# Patient Record
Sex: Male | Born: 1940 | Race: White | Hispanic: No | Marital: Married | Smoking: Former smoker
Health system: Southern US, Community
[De-identification: ages and names within clinical notes are randomized; demographics above are authoritative.]

## PROBLEM LIST (undated history)

## (undated) DIAGNOSIS — R931 Abnormal findings on diagnostic imaging of heart and coronary circulation: Secondary | ICD-10-CM

## (undated) DIAGNOSIS — R0600 Dyspnea, unspecified: Secondary | ICD-10-CM

## (undated) DIAGNOSIS — I2510 Atherosclerotic heart disease of native coronary artery without angina pectoris: Secondary | ICD-10-CM

## (undated) DIAGNOSIS — E785 Hyperlipidemia, unspecified: Secondary | ICD-10-CM

## (undated) DIAGNOSIS — K759 Inflammatory liver disease, unspecified: Secondary | ICD-10-CM

## (undated) DIAGNOSIS — I10 Essential (primary) hypertension: Secondary | ICD-10-CM

## (undated) DIAGNOSIS — I209 Angina pectoris, unspecified: Secondary | ICD-10-CM

## (undated) DIAGNOSIS — L719 Rosacea, unspecified: Secondary | ICD-10-CM

## (undated) DIAGNOSIS — K219 Gastro-esophageal reflux disease without esophagitis: Secondary | ICD-10-CM

## (undated) DIAGNOSIS — M1990 Unspecified osteoarthritis, unspecified site: Secondary | ICD-10-CM

## (undated) DIAGNOSIS — I219 Acute myocardial infarction, unspecified: Secondary | ICD-10-CM

## (undated) HISTORY — DX: Hyperlipidemia, unspecified: E78.5

## (undated) HISTORY — DX: Atherosclerotic heart disease of native coronary artery without angina pectoris: I25.10

## (undated) HISTORY — PX: ROTATOR CUFF REPAIR: SHX139

## (undated) HISTORY — PX: BACK SURGERY: SHX140

## (undated) HISTORY — DX: Abnormal findings on diagnostic imaging of heart and coronary circulation: R93.1

## (undated) HISTORY — PX: CARDIAC CATHETERIZATION: SHX172

---

## 2015-06-24 DIAGNOSIS — R05 Cough: Secondary | ICD-10-CM | POA: Diagnosis not present

## 2015-08-27 DIAGNOSIS — R05 Cough: Secondary | ICD-10-CM | POA: Diagnosis not present

## 2016-02-04 DIAGNOSIS — Z23 Encounter for immunization: Secondary | ICD-10-CM | POA: Diagnosis not present

## 2016-02-23 DIAGNOSIS — H25019 Cortical age-related cataract, unspecified eye: Secondary | ICD-10-CM | POA: Diagnosis not present

## 2016-03-09 DIAGNOSIS — R5383 Other fatigue: Secondary | ICD-10-CM | POA: Diagnosis not present

## 2016-03-09 DIAGNOSIS — R918 Other nonspecific abnormal finding of lung field: Secondary | ICD-10-CM | POA: Diagnosis not present

## 2016-03-09 DIAGNOSIS — Z87891 Personal history of nicotine dependence: Secondary | ICD-10-CM | POA: Diagnosis not present

## 2016-03-09 DIAGNOSIS — M8580 Other specified disorders of bone density and structure, unspecified site: Secondary | ICD-10-CM | POA: Diagnosis not present

## 2016-03-09 DIAGNOSIS — R0602 Shortness of breath: Secondary | ICD-10-CM | POA: Diagnosis not present

## 2016-03-26 DIAGNOSIS — R0602 Shortness of breath: Secondary | ICD-10-CM | POA: Diagnosis not present

## 2016-03-26 DIAGNOSIS — R5383 Other fatigue: Secondary | ICD-10-CM | POA: Diagnosis not present

## 2016-04-06 DIAGNOSIS — K219 Gastro-esophageal reflux disease without esophagitis: Secondary | ICD-10-CM | POA: Diagnosis not present

## 2016-08-23 DIAGNOSIS — R490 Dysphonia: Secondary | ICD-10-CM | POA: Diagnosis not present

## 2016-08-23 DIAGNOSIS — R221 Localized swelling, mass and lump, neck: Secondary | ICD-10-CM | POA: Diagnosis not present

## 2016-08-24 DIAGNOSIS — J383 Other diseases of vocal cords: Secondary | ICD-10-CM | POA: Diagnosis not present

## 2016-08-24 DIAGNOSIS — R590 Localized enlarged lymph nodes: Secondary | ICD-10-CM | POA: Diagnosis not present

## 2016-08-24 DIAGNOSIS — R221 Localized swelling, mass and lump, neck: Secondary | ICD-10-CM | POA: Diagnosis not present

## 2016-08-24 DIAGNOSIS — R490 Dysphonia: Secondary | ICD-10-CM | POA: Diagnosis not present

## 2016-09-08 DIAGNOSIS — J383 Other diseases of vocal cords: Secondary | ICD-10-CM | POA: Diagnosis not present

## 2016-09-08 DIAGNOSIS — R490 Dysphonia: Secondary | ICD-10-CM | POA: Diagnosis not present

## 2016-09-08 DIAGNOSIS — J3800 Paralysis of vocal cords and larynx, unspecified: Secondary | ICD-10-CM | POA: Diagnosis not present

## 2016-09-13 DIAGNOSIS — J383 Other diseases of vocal cords: Secondary | ICD-10-CM | POA: Diagnosis not present

## 2016-09-13 DIAGNOSIS — R490 Dysphonia: Secondary | ICD-10-CM | POA: Diagnosis not present

## 2016-09-20 DIAGNOSIS — R490 Dysphonia: Secondary | ICD-10-CM | POA: Diagnosis not present

## 2016-09-20 DIAGNOSIS — J383 Other diseases of vocal cords: Secondary | ICD-10-CM | POA: Diagnosis not present

## 2016-09-27 DIAGNOSIS — R490 Dysphonia: Secondary | ICD-10-CM | POA: Diagnosis not present

## 2016-09-27 DIAGNOSIS — J383 Other diseases of vocal cords: Secondary | ICD-10-CM | POA: Diagnosis not present

## 2016-10-11 DIAGNOSIS — R490 Dysphonia: Secondary | ICD-10-CM | POA: Diagnosis not present

## 2016-10-11 DIAGNOSIS — J383 Other diseases of vocal cords: Secondary | ICD-10-CM | POA: Diagnosis not present

## 2016-11-10 DIAGNOSIS — M25551 Pain in right hip: Secondary | ICD-10-CM | POA: Diagnosis not present

## 2016-11-10 DIAGNOSIS — M1611 Unilateral primary osteoarthritis, right hip: Secondary | ICD-10-CM | POA: Diagnosis not present

## 2016-12-29 DIAGNOSIS — M169 Osteoarthritis of hip, unspecified: Secondary | ICD-10-CM | POA: Diagnosis not present

## 2017-01-03 DIAGNOSIS — Z23 Encounter for immunization: Secondary | ICD-10-CM | POA: Diagnosis not present

## 2017-01-26 DIAGNOSIS — Z136 Encounter for screening for cardiovascular disorders: Secondary | ICD-10-CM | POA: Diagnosis not present

## 2017-01-26 DIAGNOSIS — R0602 Shortness of breath: Secondary | ICD-10-CM | POA: Diagnosis not present

## 2017-02-18 ENCOUNTER — Encounter: Payer: Self-pay | Admitting: Cardiology

## 2017-02-18 DIAGNOSIS — R001 Bradycardia, unspecified: Secondary | ICD-10-CM | POA: Diagnosis not present

## 2017-02-18 DIAGNOSIS — E785 Hyperlipidemia, unspecified: Secondary | ICD-10-CM | POA: Diagnosis not present

## 2017-02-18 DIAGNOSIS — R0789 Other chest pain: Secondary | ICD-10-CM | POA: Diagnosis not present

## 2017-02-18 DIAGNOSIS — I493 Ventricular premature depolarization: Secondary | ICD-10-CM | POA: Diagnosis not present

## 2017-03-02 DIAGNOSIS — I491 Atrial premature depolarization: Secondary | ICD-10-CM | POA: Diagnosis not present

## 2017-03-02 DIAGNOSIS — I471 Supraventricular tachycardia: Secondary | ICD-10-CM | POA: Diagnosis not present

## 2017-03-02 DIAGNOSIS — E785 Hyperlipidemia, unspecified: Secondary | ICD-10-CM | POA: Diagnosis not present

## 2017-03-02 DIAGNOSIS — I472 Ventricular tachycardia: Secondary | ICD-10-CM | POA: Diagnosis not present

## 2017-03-02 DIAGNOSIS — R9431 Abnormal electrocardiogram [ECG] [EKG]: Secondary | ICD-10-CM | POA: Diagnosis not present

## 2017-03-02 DIAGNOSIS — R079 Chest pain, unspecified: Secondary | ICD-10-CM | POA: Diagnosis not present

## 2017-03-02 DIAGNOSIS — R9439 Abnormal result of other cardiovascular function study: Secondary | ICD-10-CM | POA: Diagnosis not present

## 2017-03-02 DIAGNOSIS — I493 Ventricular premature depolarization: Secondary | ICD-10-CM | POA: Diagnosis not present

## 2017-03-07 DIAGNOSIS — R079 Chest pain, unspecified: Secondary | ICD-10-CM | POA: Diagnosis not present

## 2017-03-09 DIAGNOSIS — I491 Atrial premature depolarization: Secondary | ICD-10-CM | POA: Diagnosis not present

## 2017-03-09 DIAGNOSIS — I471 Supraventricular tachycardia: Secondary | ICD-10-CM | POA: Diagnosis not present

## 2017-03-09 DIAGNOSIS — I472 Ventricular tachycardia: Secondary | ICD-10-CM | POA: Diagnosis not present

## 2017-03-09 DIAGNOSIS — I493 Ventricular premature depolarization: Secondary | ICD-10-CM | POA: Diagnosis not present

## 2017-03-11 DIAGNOSIS — R079 Chest pain, unspecified: Secondary | ICD-10-CM | POA: Diagnosis not present

## 2017-04-06 DIAGNOSIS — M25511 Pain in right shoulder: Secondary | ICD-10-CM | POA: Diagnosis not present

## 2017-04-06 DIAGNOSIS — E785 Hyperlipidemia, unspecified: Secondary | ICD-10-CM | POA: Diagnosis not present

## 2017-04-06 DIAGNOSIS — R9439 Abnormal result of other cardiovascular function study: Secondary | ICD-10-CM | POA: Diagnosis not present

## 2017-04-06 DIAGNOSIS — Z79899 Other long term (current) drug therapy: Secondary | ICD-10-CM | POA: Diagnosis not present

## 2017-04-06 DIAGNOSIS — R079 Chest pain, unspecified: Secondary | ICD-10-CM | POA: Diagnosis not present

## 2017-04-06 DIAGNOSIS — M25649 Stiffness of unspecified hand, not elsewhere classified: Secondary | ICD-10-CM | POA: Diagnosis not present

## 2017-05-13 ENCOUNTER — Ambulatory Visit (INDEPENDENT_AMBULATORY_CARE_PROVIDER_SITE_OTHER): Payer: Medicare Other | Admitting: Cardiology

## 2017-05-13 ENCOUNTER — Encounter: Payer: Self-pay | Admitting: Cardiology

## 2017-05-13 VITALS — BP 146/84 | HR 55 | Ht 70.0 in | Wt 191.2 lb

## 2017-05-13 DIAGNOSIS — R931 Abnormal findings on diagnostic imaging of heart and coronary circulation: Secondary | ICD-10-CM | POA: Diagnosis not present

## 2017-05-13 DIAGNOSIS — R9439 Abnormal result of other cardiovascular function study: Secondary | ICD-10-CM

## 2017-05-13 DIAGNOSIS — I10 Essential (primary) hypertension: Secondary | ICD-10-CM | POA: Diagnosis not present

## 2017-05-13 DIAGNOSIS — R5383 Other fatigue: Secondary | ICD-10-CM

## 2017-05-13 DIAGNOSIS — R0609 Other forms of dyspnea: Secondary | ICD-10-CM

## 2017-05-13 DIAGNOSIS — Z01812 Encounter for preprocedural laboratory examination: Secondary | ICD-10-CM

## 2017-05-13 DIAGNOSIS — Z136 Encounter for screening for cardiovascular disorders: Secondary | ICD-10-CM | POA: Diagnosis not present

## 2017-05-13 DIAGNOSIS — I209 Angina pectoris, unspecified: Secondary | ICD-10-CM

## 2017-05-13 DIAGNOSIS — E785 Hyperlipidemia, unspecified: Secondary | ICD-10-CM

## 2017-05-13 DIAGNOSIS — I2510 Atherosclerotic heart disease of native coronary artery without angina pectoris: Secondary | ICD-10-CM

## 2017-05-13 HISTORY — DX: Angina pectoris, unspecified: I20.9

## 2017-05-13 HISTORY — DX: Atherosclerotic heart disease of native coronary artery without angina pectoris: I25.10

## 2017-05-13 NOTE — H&P (View-Only) (Signed)
Cardiology Office Note    Date:  05/13/2017   ID:  Patrick Nguyen, DOB 08/01/1940, MRN [id number]  PCP:  Patrick Huddle, MD  Cardiologist:   No primary care provider on file. Referring:  Patrick Huddle, MD  Chief Complaint  Patient presents with  . Shortness of Breath      History of Present Illness: Patrick Nguyen is a 77 y.o. male who is referred by Patrick Huddle, MD for evaluation of CAD.  He just moved here from [place].  I was able to look at old records and to speak with his cardiologist in [place].    he had shortness of breath in 2016.    Stress echocardiogram in 2016 demonstrated a well-preserved ejection fraction.  There were no wall motion abnormalities.  It looks like there was another stress echo in December 2017 also demonstrated no wall motion abnormalities.  This year when he was again seen by cardiology had some palpitations and may be some vague chest discomfort.  They ordered a coronary calcium score.  This came back at 400 with 3 vessel distribution.  I was able to speak with his cardiologist and get the results of the stress test that they did prior to him moving from [place].  He was found to have a fixed defect with peri-infarct ischemia.  There was ST depression post exercise.  His ejection fraction was 61%.  He did wear a Holter and was found to have some nonsustained cardia atrial tachycardia, some PVCs and one 3 beat run of nonsustained V. tach.  Because of the abnormalities he was called as he was leaving and it was suggested that he have a cardiac catheterization.  He has since moved here.  I have reviewed extensive outpatient records for this visit.  The patient is limited by shoulder pain.  He does get short of breath walking a short distance on level ground and this started probably more in the late fall of last year although that seems to have plateaued.  He gets some rare sharp chest pain.  He is not describing any substernal chest pressure, neck or arm discomfort.  Is  not describing palpitations, presyncope or syncope.  He is not having any PND or orthopnea.  He is limited by shoulder problems.  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  His wife and he moved back here because she was from here originally.   Past Medical History:  Diagnosis Date  . Elevated coronary artery calcium score   . Hyperlipidemia      Current Outpatient Medications  Medication Sig Dispense Refill  . aspirin EC 81 MG tablet Take 81 mg by mouth daily.    . metoprolol tartrate (LOPRESSOR) 25 MG tablet Take 25 mg by mouth 2 (two) times daily.    . Rosuvastatin Calcium (CRESTOR PO) Take 20 mg by mouth every evening.      No current facility-administered medications for this visit.     Allergies:   Patient has no known allergies.    Social History:  The patient  reports that he quit smoking about 31 years ago. he has never used smokeless tobacco. He reports that he drinks about 1.8 oz of alcohol per week. He reports that he does not use drugs.   Family History:  The patient's family history includes AAA (abdominal aortic aneurysm) in his father; Cancer in his father; Heart disease (age of onset: 32) in his mother; Hypertension in his mother.  ROS:  Please see the history of present illness.   Otherwise, review of systems are positive for none.   All other systems are reviewed and negative.    PHYSICAL EXAM: VS:  BP (!) 146/84 (BP Location: Left Arm)   Pulse (!) 55   Ht 5\' 10"  (1.778 m)   Wt 191 lb 3.2 oz (86.7 kg)   BMI 27.43 kg/m  , BMI Body mass index is 27.43 kg/m. GENERAL:  Well appearing HEENT:  Pupils equal round and reactive, fundi not visualized, oral mucosa unremarkable NECK:  No jugular venous distention, waveform within normal limits, carotid upstroke brisk and symmetric, no bruits, no thyromegaly LYMPHATICS:  No cervical, inguinal adenopathy LUNGS:  Clear to auscultation bilaterally BACK:  No CVA tenderness CHEST:  Unremarkable HEART:  PMI not  displaced or sustained,S1 and S2 within normal limits, no S3, no S4, no clicks, no rubs, no murmurs ABD:  Flat, positive bowel sounds normal in frequency in pitch, no bruits, no rebound, no guarding, no midline pulsatile mass, no hepatomegaly, no splenomegaly EXT:  2 plus pulses throughout, no edema, no cyanosis no clubbing SKIN:  No rashes no nodules NEURO:  Cranial nerves II through XII grossly intact, motor grossly intact throughout PSYCH:  Cognitively intact, oriented to person place and time    EKG:  EKG is ordered today. The ekg ordered today  Demonstrated sinus rhythm, rate 60, axis within normal limits, intervals within normal limits, no acute ST-T wave changes.   Recent Labs: No results found for requested labs within last 8760 hours.    Lipid Panel No results found for: CHOL, TRIG, HDL, CHOLHDL, VLDL, LDLCALC, LDLDIRECT    Wt Readings from Last 3 Encounters:  05/13/17 191 lb 3.2 oz (86.7 kg)      Other studies Reviewed: Additional studies/ records that were reviewed today include: Outside office records.  I spoke with the cardiologist in [place].. Review of the above records demonstrates:  Please see elsewhere in the note.     ASSESSMENT AND PLAN:  SOB:  The patient has extensive coronary calcium, progressive shortness of breath.  And now an abnormal stress test.  The possibility of high-grade obstructive coronary disease is high.  Therefore, cardiac catheterization is indicated. The patient understands that risks included but are not limited to stroke (1 in 1000), death (1 in 35), kidney failure [usually temporary] (1 in 500), bleeding (1 in 200), allergic reaction [possibly serious] (1 in 200).  The patient understands and agrees to proceed.   ABNORMAL STRESS TEST:  As above.  CORONARY CALCIUM:  I will continue with aggressive risk reduction.  DYSLIPIDEMIA:   LDL was 67 and HDL 57.  He will continue the meds as listed.  AAA EVALTION:   The patient was a  previous smoker and his father had an abdominal aortic aneurysm that apparently ruptured.  He will have a screening ultrasound.  HTN:  The blood pressure is at target. No change in medications is indicated. We will continue with therapeutic lifestyle changes (TLC).'   Current medicines are reviewed at length with the patient today.  The patient does not have concerns regarding medicines.  The following changes have been made:  no change  Labs/ tests ordered today include: Cardiac cath.  No orders of the defined types were placed in this encounter.    Disposition:   FU with me after the cath.     Signed, Minus Breeding, MD  05/13/2017 12:33 PM    [facility]

## 2017-05-13 NOTE — Progress Notes (Signed)
Cardiology Office Note    Date:  05/13/2017   ID:  Patrick Nguyen, DOB Feb 04, 1941, MRN [id number]  PCP:  [name], MD  Cardiologist:   No primary care provider on file. Referring:  [name], MD  Chief Complaint  Patient presents with  . Shortness of Breath      History of Present Illness: Patrick Nguyen is a 77 y.o. male who is referred by [name], MD for evaluation of CAD.  He just moved here from [place].  I was able to look at old records and to speak with his cardiologist in [place].    he had shortness of breath in 2016.    Stress echocardiogram in 2016 demonstrated a well-preserved ejection fraction.  There were no wall motion abnormalities.  It looks like there was another stress echo in December 2017 also demonstrated no wall motion abnormalities.  This year when he was again seen by cardiology had some palpitations and may be some vague chest discomfort.  They ordered a coronary calcium score.  This came back at 400 with 3 vessel distribution.  I was able to speak with his cardiologist and get the results of the stress test that they did prior to him moving from [place].  He was found to have a fixed defect with peri-infarct ischemia.  There was ST depression post exercise.  His ejection fraction was 61%.  He did wear a Holter and was found to have some nonsustained cardia atrial tachycardia, some PVCs and one 3 beat run of nonsustained V. tach.  Because of the abnormalities he was called as he was leaving and it was suggested that he have a cardiac catheterization.  He has since moved here.  I have reviewed extensive outpatient records for this visit.  The patient is limited by shoulder pain.  He does get short of breath walking a short distance on level ground and this started probably more in the late fall of last year although that seems to have plateaued.  He gets some rare sharp chest pain.  He is not describing any substernal chest pressure, neck or arm discomfort.  Is  not describing palpitations, presyncope or syncope.  He is not having any PND or orthopnea.  He is limited by shoulder problems.  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  His wife and he moved back here because she was from here originally.   Past Medical History:  Diagnosis Date  . Elevated coronary artery calcium score   . Hyperlipidemia      Current Outpatient Medications  Medication Sig Dispense Refill  . aspirin EC 81 MG tablet Take 81 mg by mouth daily.    . metoprolol tartrate (LOPRESSOR) 25 MG tablet Take 25 mg by mouth 2 (two) times daily.    . Rosuvastatin Calcium (CRESTOR PO) Take 20 mg by mouth every evening.      No current facility-administered medications for this visit.     Allergies:   Patient has no known allergies.    Social History:  The patient  reports that he quit smoking about 31 years ago. he has never used smokeless tobacco. He reports that he drinks about 1.8 oz of alcohol per week. He reports that he does not use drugs.   Family History:  The patient's family history includes AAA (abdominal aortic aneurysm) in his father; Cancer in his father; Heart disease (age of onset: 42) in his mother; Hypertension in his mother.  ROS:  Please see the history of present illness.   Otherwise, review of systems are positive for none.   All other systems are reviewed and negative.    PHYSICAL EXAM: VS:  BP (!) 146/84 (BP Location: Left Arm)   Pulse (!) 55   Ht 5\' 10"  (1.778 m)   Wt 191 lb 3.2 oz (86.7 kg)   BMI 27.43 kg/m  , BMI Body mass index is 27.43 kg/m. GENERAL:  Well appearing HEENT:  Pupils equal round and reactive, fundi not visualized, oral mucosa unremarkable NECK:  No jugular venous distention, waveform within normal limits, carotid upstroke brisk and symmetric, no bruits, no thyromegaly LYMPHATICS:  No cervical, inguinal adenopathy LUNGS:  Clear to auscultation bilaterally BACK:  No CVA tenderness CHEST:  Unremarkable HEART:  PMI not  displaced or sustained,S1 and S2 within normal limits, no S3, no S4, no clicks, no rubs, no murmurs ABD:  Flat, positive bowel sounds normal in frequency in pitch, no bruits, no rebound, no guarding, no midline pulsatile mass, no hepatomegaly, no splenomegaly EXT:  2 plus pulses throughout, no edema, no cyanosis no clubbing SKIN:  No rashes no nodules NEURO:  Cranial nerves II through XII grossly intact, motor grossly intact throughout PSYCH:  Cognitively intact, oriented to person place and time    EKG:  EKG is ordered today. The ekg ordered today  Demonstrated sinus rhythm, rate 60, axis within normal limits, intervals within normal limits, no acute ST-T wave changes.   Recent Labs: No results found for requested labs within last 8760 hours.    Lipid Panel No results found for: CHOL, TRIG, HDL, CHOLHDL, VLDL, LDLCALC, LDLDIRECT    Wt Readings from Last 3 Encounters:  05/13/17 191 lb 3.2 oz (86.7 kg)      Other studies Reviewed: Additional studies/ records that were reviewed today include: Outside office records.  I spoke with the cardiologist in [place].. Review of the above records demonstrates:  Please see elsewhere in the note.     ASSESSMENT AND PLAN:  SOB:  The patient has extensive coronary calcium, progressive shortness of breath.  And now an abnormal stress test.  The possibility of high-grade obstructive coronary disease is high.  Therefore, cardiac catheterization is indicated. The patient understands that risks included but are not limited to stroke (1 in 1000), death (1 in 66), kidney failure [usually temporary] (1 in 500), bleeding (1 in 200), allergic reaction [possibly serious] (1 in 200).  The patient understands and agrees to proceed.   ABNORMAL STRESS TEST:  As above.  CORONARY CALCIUM:  I will continue with aggressive risk reduction.  DYSLIPIDEMIA:   LDL was 67 and HDL 57.  He will continue the meds as listed.  AAA EVALTION:   The patient was a  previous smoker and his father had an abdominal aortic aneurysm that apparently ruptured.  He will have a screening ultrasound.  HTN:  The blood pressure is at target. No change in medications is indicated. We will continue with therapeutic lifestyle changes (TLC).'   Current medicines are reviewed at length with the patient today.  The patient does not have concerns regarding medicines.  The following changes have been made:  no change  Labs/ tests ordered today include: Cardiac cath.  No orders of the defined types were placed in this encounter.    Disposition:   FU with me after the cath.     Signed, Minus Breeding, MD  05/13/2017 12:33 PM    [facility]

## 2017-05-13 NOTE — Addendum Note (Signed)
Addended by: Vennie Homans on: 05/13/2017 02:59 PM   Modules accepted: Orders

## 2017-05-13 NOTE — Patient Instructions (Signed)
Medication Instructions:  Continue current medications  If you need a refill on your cardiac medications before your next appointment, please call your pharmacy.  Labwork: Pre Op Labs HERE IN OUR OFFICE AT [facility]  Take the provided lab slips for you to take with you to the lab for you blood draw.   You will NOT need to fast   You may go to any [facility] lab that is convenient for you however, we do have a lab in our office that is able to assist you. You do NOT need an appointment for our lab. Once in our office lobby there is a podium to the right of the check-in desk where you are to sign-in and ring a doorbell to alert [place] you are here. Lab is open Monday-Friday from 8:00am to 4:00pm; and is closed for lunch from 12:45p-1:45pm    Testing/Procedures: Your physician has requested that you have a cardiac catheterization. Cardiac catheterization is used to diagnose and/or treat various heart conditions. Doctors may recommend this procedure for a number of different reasons. The most common reason is to evaluate chest pain. Chest pain can be a symptom of coronary artery disease (CAD), and cardiac catheterization can show whether plaque is narrowing or blocking your heart's arteries. This procedure is also used to evaluate the valves, as well as measure the blood flow and oxygen levels in different parts of your heart. For further information please visit [email or web address]. Please follow instruction sheet, as given.  Your physician has requested that you have an abdominal aorta duplex. During this test, an ultrasound is used to evaluate the aorta. Allow 30 minutes for this exam. Do not eat after midnight the day before and avoid carbonated beverages   Special Instructions:   [facility] [address] [place] [address] Dept: [phone] Loc: [phone]  Patrick Nguyen  05/13/2017  You are scheduled  for a Cardiac Catheterization on Thursday, February 7 with Dr. [name].  1. Please arrive at the [facility] (Main Entrance A) at [facility]: [place], [place] [address] at 10:00 AM (two hours before your procedure to ensure your preparation). Free valet parking service is available.   Special note: Every effort is made to have your procedure done on time. Please understand that emergencies sometimes delay scheduled procedures.  2. Diet: Do not eat or drink anything after midnight prior to your procedure except sips of water to take medications.  3. Labs: Monday  4. Medication instructions in preparation for your procedure:    Current Outpatient Medications (Cardiovascular):  .  metoprolol tartrate (LOPRESSOR) 25 MG tablet, Take 25 mg by mouth 2 (two) times daily. .  Rosuvastatin Calcium (CRESTOR PO), Take 20 mg by mouth every evening.    Current Outpatient Medications (Analgesics):  .  aspirin EC 81 MG tablet, Take 81 mg by mouth daily.   *For reference purposes while preparing patient instructions.   Delete this med list prior to printing instructions for patient.*   On the morning of your procedure, morning medicines  You may use sips of water.  5. Plan for one night stay--bring personal belongings. 6. Bring a current list of your medications and current insurance cards. 7. You MUST have a responsible person to drive you home. 8. Someone MUST be with you the first 24 hours after you arrive home or your discharge will be delayed. 9. Please wear clothes that are easy to get  on and off and wear slip-on shoes.  Thank you for allowing [place] to care for you!   -- [facility]   Follow-Up: Your physician wants you to follow-up in: After Cath.     Thank you for choosing [facility]!!

## 2017-05-16 DIAGNOSIS — Z01812 Encounter for preprocedural laboratory examination: Secondary | ICD-10-CM | POA: Diagnosis not present

## 2017-05-16 DIAGNOSIS — R9439 Abnormal result of other cardiovascular function study: Secondary | ICD-10-CM | POA: Diagnosis not present

## 2017-05-16 DIAGNOSIS — R5383 Other fatigue: Secondary | ICD-10-CM | POA: Diagnosis not present

## 2017-05-17 ENCOUNTER — Telehealth: Payer: Self-pay | Admitting: *Deleted

## 2017-05-17 ENCOUNTER — Other Ambulatory Visit: Payer: Self-pay | Admitting: Cardiology

## 2017-05-17 ENCOUNTER — Other Ambulatory Visit: Payer: Self-pay | Admitting: *Deleted

## 2017-05-17 DIAGNOSIS — Z8249 Family history of ischemic heart disease and other diseases of the circulatory system: Secondary | ICD-10-CM

## 2017-05-17 DIAGNOSIS — Z136 Encounter for screening for cardiovascular disorders: Secondary | ICD-10-CM

## 2017-05-17 DIAGNOSIS — Z87891 Personal history of nicotine dependence: Secondary | ICD-10-CM

## 2017-05-17 DIAGNOSIS — Z01818 Encounter for other preprocedural examination: Secondary | ICD-10-CM

## 2017-05-17 LAB — BASIC METABOLIC PANEL
BUN/Creatinine Ratio: 14 (ref 10–24)
BUN: 14 mg/dL (ref 8–27)
CO2: 22 mmol/L (ref 20–29)
CREATININE: 0.98 mg/dL (ref 0.76–1.27)
Calcium: 9 mg/dL (ref 8.6–10.2)
Chloride: 104 mmol/L (ref 96–106)
GFR calc Af Amer: 87 mL/min/{1.73_m2} (ref >59)
GFR calc non Af Amer: 76 mL/min/{1.73_m2} (ref >59)
GLUCOSE: 87 mg/dL (ref 65–99)
Potassium: 4.6 mmol/L (ref 3.5–5.2)
SODIUM: 141 mmol/L (ref 134–144)

## 2017-05-17 LAB — CBC
HEMOGLOBIN: 15.4 g/dL (ref 13.0–17.7)
Hematocrit: 45.3 % (ref 37.5–51.0)
MCH: 30.6 pg (ref 26.6–33.0)
MCHC: 34 g/dL (ref 31.5–35.7)
MCV: 90 fL (ref 79–97)
Platelets: 231 10*3/uL (ref 150–379)
RBC: 5.03 x10E6/uL (ref 4.14–5.80)
RDW: 13.4 % (ref 12.3–15.4)
WBC: 6.9 10*3/uL (ref 3.4–10.8)

## 2017-05-17 LAB — TSH: TSH: 2.26 u[IU]/mL (ref 0.450–4.500)

## 2017-05-17 NOTE — Telephone Encounter (Signed)
Cardiac Cath scheduled for: Thursday May 19, 2017 [facility] Main Entrance A[facility] at: 10 AM Allergies: verified allergies listed in Epic Diabetes: no  HOLD medications: None    AM meds can be  taken pre-cath with sip of water including: ASA 81 mg am of procedure.   Nothing to eat or drink after midnight. Confirmed patient has responsible person to drive home post procedure and observe patient for 24 hours: yes

## 2017-05-17 NOTE — Telephone Encounter (Signed)
I spoke with patient, confirmed and discussed cath instructions with patient, he verbalize understanding, thanked me for call

## 2017-05-18 ENCOUNTER — Ambulatory Visit (HOSPITAL_COMMUNITY)
Admission: RE | Admit: 2017-05-18 | Discharge: 2017-05-18 | Disposition: A | Discharge status: Home / Self Care | Payer: Medicare Other | Source: Ambulatory Visit | Attending: Cardiovascular Disease | Admitting: Cardiovascular Disease

## 2017-05-18 DIAGNOSIS — Z8249 Family history of ischemic heart disease and other diseases of the circulatory system: Secondary | ICD-10-CM | POA: Diagnosis not present

## 2017-05-18 DIAGNOSIS — Z87891 Personal history of nicotine dependence: Secondary | ICD-10-CM | POA: Diagnosis not present

## 2017-05-18 DIAGNOSIS — I723 Aneurysm of iliac artery: Secondary | ICD-10-CM | POA: Insufficient documentation

## 2017-05-18 DIAGNOSIS — Z136 Encounter for screening for cardiovascular disorders: Secondary | ICD-10-CM | POA: Insufficient documentation

## 2017-05-19 ENCOUNTER — Ambulatory Visit (HOSPITAL_COMMUNITY)
Admission: RE | Admit: 2017-05-19 | Discharge: 2017-05-19 | Disposition: A | Discharge status: Home / Self Care | Payer: Medicare Other | Source: Ambulatory Visit | Attending: Interventional Cardiology | Admitting: Interventional Cardiology

## 2017-05-19 ENCOUNTER — Encounter (HOSPITAL_COMMUNITY)
Admission: RE | Disposition: A | Discharge status: Home / Self Care | Payer: Self-pay | Source: Ambulatory Visit | Attending: Interventional Cardiology

## 2017-05-19 DIAGNOSIS — R0609 Other forms of dyspnea: Secondary | ICD-10-CM

## 2017-05-19 DIAGNOSIS — I10 Essential (primary) hypertension: Secondary | ICD-10-CM | POA: Insufficient documentation

## 2017-05-19 DIAGNOSIS — I2510 Atherosclerotic heart disease of native coronary artery without angina pectoris: Secondary | ICD-10-CM | POA: Diagnosis not present

## 2017-05-19 DIAGNOSIS — Z87891 Personal history of nicotine dependence: Secondary | ICD-10-CM | POA: Diagnosis not present

## 2017-05-19 DIAGNOSIS — Z7982 Long term (current) use of aspirin: Secondary | ICD-10-CM | POA: Diagnosis not present

## 2017-05-19 DIAGNOSIS — I2582 Chronic total occlusion of coronary artery: Secondary | ICD-10-CM | POA: Insufficient documentation

## 2017-05-19 DIAGNOSIS — Z8249 Family history of ischemic heart disease and other diseases of the circulatory system: Secondary | ICD-10-CM | POA: Insufficient documentation

## 2017-05-19 DIAGNOSIS — R9439 Abnormal result of other cardiovascular function study: Secondary | ICD-10-CM | POA: Diagnosis not present

## 2017-05-19 DIAGNOSIS — R931 Abnormal findings on diagnostic imaging of heart and coronary circulation: Secondary | ICD-10-CM | POA: Diagnosis present

## 2017-05-19 DIAGNOSIS — I25118 Atherosclerotic heart disease of native coronary artery with other forms of angina pectoris: Secondary | ICD-10-CM | POA: Insufficient documentation

## 2017-05-19 DIAGNOSIS — E785 Hyperlipidemia, unspecified: Secondary | ICD-10-CM | POA: Diagnosis not present

## 2017-05-19 DIAGNOSIS — Z01818 Encounter for other preprocedural examination: Secondary | ICD-10-CM

## 2017-05-19 DIAGNOSIS — Z79899 Other long term (current) drug therapy: Secondary | ICD-10-CM | POA: Insufficient documentation

## 2017-05-19 HISTORY — PX: INTRAVASCULAR PRESSURE WIRE/FFR STUDY: CATH118243

## 2017-05-19 HISTORY — PX: LEFT HEART CATH AND CORONARY ANGIOGRAPHY: CATH118249

## 2017-05-19 LAB — PROTIME-INR
INR: 1
Prothrombin Time: 13.1 seconds (ref 11.4–15.2)

## 2017-05-19 LAB — POCT ACTIVATED CLOTTING TIME: Activated Clotting Time: 301 seconds

## 2017-05-19 SURGERY — LEFT HEART CATH AND CORONARY ANGIOGRAPHY
Anesthesia: LOCAL

## 2017-05-19 MED ORDER — VERAPAMIL HCL 2.5 MG/ML IV SOLN
INTRAVENOUS | Status: DC | PRN
  Administered 2017-05-19: 13:00:00 via INTRA_ARTERIAL

## 2017-05-19 MED ORDER — HEPARIN (PORCINE) IN NACL 2-0.9 UNIT/ML-% IJ SOLN
INTRAMUSCULAR | Status: AC | PRN
  Administered 2017-05-19: 1000 mL

## 2017-05-19 MED ORDER — ONDANSETRON HCL 4 MG/2ML IJ SOLN: 4.0000 mg | Freq: Four times a day (QID) | INTRAMUSCULAR | Status: DC | PRN

## 2017-05-19 MED ORDER — SODIUM CHLORIDE 0.9 % IV SOLN
INTRAVENOUS | Status: AC
  Administered 2017-05-19: 15:00:00 via INTRAVENOUS

## 2017-05-19 MED ORDER — SODIUM CHLORIDE 0.9% FLUSH: 3.0000 mL | INTRAVENOUS | Status: DC | PRN

## 2017-05-19 MED ORDER — MIDAZOLAM HCL 2 MG/2ML IJ SOLN
INTRAMUSCULAR | Status: DC | PRN
  Administered 2017-05-19 (×2): 1 mg via INTRAVENOUS

## 2017-05-19 MED ORDER — ADENOSINE 12 MG/4ML IV SOLN
INTRAVENOUS | Status: AC
  Filled 2017-05-19: qty 16

## 2017-05-19 MED ORDER — NITROGLYCERIN 1 MG/10 ML FOR IR/CATH LAB
INTRA_ARTERIAL | Status: DC | PRN
  Administered 2017-05-19: 200 ug via INTRACORONARY

## 2017-05-19 MED ORDER — VERAPAMIL HCL 2.5 MG/ML IV SOLN
INTRAVENOUS | Status: AC
  Filled 2017-05-19: qty 2

## 2017-05-19 MED ORDER — NITROGLYCERIN 1 MG/10 ML FOR IR/CATH LAB
INTRA_ARTERIAL | Status: AC
  Filled 2017-05-19: qty 10

## 2017-05-19 MED ORDER — FENTANYL CITRATE (PF) 100 MCG/2ML IJ SOLN
INTRAMUSCULAR | Status: AC
  Filled 2017-05-19: qty 2

## 2017-05-19 MED ORDER — MIDAZOLAM HCL 2 MG/2ML IJ SOLN
INTRAMUSCULAR | Status: AC
  Filled 2017-05-19: qty 2

## 2017-05-19 MED ORDER — HEPARIN SODIUM (PORCINE) 1000 UNIT/ML IJ SOLN
INTRAMUSCULAR | Status: DC | PRN
  Administered 2017-05-19: 5000 [IU] via INTRAVENOUS
  Administered 2017-05-19: 4000 [IU] via INTRAVENOUS

## 2017-05-19 MED ORDER — HEPARIN SODIUM (PORCINE) 1000 UNIT/ML IJ SOLN
INTRAMUSCULAR | Status: AC
  Filled 2017-05-19: qty 1

## 2017-05-19 MED ORDER — SODIUM CHLORIDE 0.9% FLUSH: 3.0000 mL | Freq: Two times a day (BID) | INTRAVENOUS | Status: DC

## 2017-05-19 MED ORDER — SODIUM CHLORIDE 0.9 % IV SOLN
INTRAVENOUS | Status: DC
  Administered 2017-05-19: 11:00:00 via INTRAVENOUS

## 2017-05-19 MED ORDER — ASPIRIN 81 MG PO CHEW: 81.0000 mg | CHEWABLE_TABLET | Freq: Every day | ORAL | Status: DC

## 2017-05-19 MED ORDER — FENTANYL CITRATE (PF) 100 MCG/2ML IJ SOLN
INTRAMUSCULAR | Status: DC | PRN
  Administered 2017-05-19: 50 ug via INTRAVENOUS

## 2017-05-19 MED ORDER — ASPIRIN 81 MG PO CHEW: 81.0000 mg | CHEWABLE_TABLET | ORAL | Status: DC

## 2017-05-19 MED ORDER — LIDOCAINE HCL (PF) 1 % IJ SOLN
INTRAMUSCULAR | Status: DC | PRN
  Administered 2017-05-19: 5 mL

## 2017-05-19 MED ORDER — LIDOCAINE HCL 1 % IJ SOLN
INTRAMUSCULAR | Status: AC
  Filled 2017-05-19: qty 20

## 2017-05-19 MED ORDER — HEPARIN (PORCINE) IN NACL 2-0.9 UNIT/ML-% IJ SOLN
INTRAMUSCULAR | Status: AC
  Filled 2017-05-19: qty 1000

## 2017-05-19 MED ORDER — IOHEXOL 350 MG/ML SOLN
INTRAVENOUS | Status: DC | PRN
  Administered 2017-05-19: 160 mL via INTRAVENOUS

## 2017-05-19 MED ORDER — ADENOSINE (DIAGNOSTIC) 140MCG/KG/MIN
INTRAVENOUS | Status: DC | PRN
  Administered 2017-05-19: 140 ug/kg/min via INTRAVENOUS

## 2017-05-19 MED ORDER — SODIUM CHLORIDE 0.9 % IV SOLN: 250.0000 mL | INTRAVENOUS | Status: DC | PRN

## 2017-05-19 MED ORDER — ACETAMINOPHEN 325 MG PO TABS: 650.0000 mg | ORAL_TABLET | ORAL | Status: DC | PRN

## 2017-05-19 MED ORDER — OXYCODONE HCL 5 MG PO TABS: 5.0000 mg | ORAL_TABLET | ORAL | Status: DC | PRN

## 2017-05-19 SURGICAL SUPPLY — 16 items
CATH 5FR JL3.5 JR4 ANG PIG MP (CATHETERS) ×2 IMPLANT
CATH LAUNCHER 5F EBU3.5 (CATHETERS) ×2 IMPLANT
CATH MICROCATH NAVVUS (MICROCATHETER) ×1 IMPLANT
COVER PRB 48X5XTLSCP FOLD TPE (BAG) ×1 IMPLANT
COVER PROBE 5X48 (BAG) ×1
DEVICE RAD COMP TR BAND LRG (VASCULAR PRODUCTS) IMPLANT
GLIDESHEATH SLEND A-KIT 6F 22G (SHEATH) ×2 IMPLANT
GUIDEWIRE INQWIRE 1.5J.035X260 (WIRE) ×1 IMPLANT
INQWIRE 1.5J .035X260CM (WIRE) ×2
KIT ESSENTIALS PG (KITS) ×2 IMPLANT
KIT HEART LEFT (KITS) ×2 IMPLANT
MICROCATHETER NAVVUS (MICROCATHETER) ×2
PACK CARDIAC CATHETERIZATION (CUSTOM PROCEDURE TRAY) ×2 IMPLANT
TRANSDUCER W/STOPCOCK (MISCELLANEOUS) ×2 IMPLANT
TUBING CIL FLEX 10 FLL-RA (TUBING) ×2 IMPLANT
WIRE ASAHI PROWATER 180CM (WIRE) ×2 IMPLANT

## 2017-05-19 NOTE — Interval H&P Note (Signed)
Cath Lab Visit (complete for each Cath Lab visit)  Clinical Evaluation Leading to the Procedure:   ACS: No.  Non-ACS:    Anginal Classification: CCS III  Anti-ischemic medical therapy: Minimal Therapy (1 class of medications)  Non-Invasive Test Results: Intermediate-risk stress test findings: cardiac mortality 1-3%/year  Prior CABG: No previous CABG      History and Physical Interval Note:  05/19/2017 12:02 PM  [name]  has presented today for surgery, with the diagnosis of abn stress  The various methods of treatment have been discussed with the patient and family. After consideration of risks, benefits and other options for treatment, the patient has consented to  Procedure(s): LEFT HEART CATH AND CORONARY ANGIOGRAPHY (N/A) as a surgical intervention .  The patient's history has been reviewed, patient examined, no change in status, stable for surgery.  I have reviewed the patient's chart and labs.  Questions were answered to the patient's satisfaction.     [name] III

## 2017-05-19 NOTE — Discharge Instructions (Signed)
**Note Patrick Nguyen-identified via Obfuscation** Radial Site Care °Refer to this sheet in the next few weeks. These instructions provide you with information about caring for yourself after your procedure. Your health care provider may also give you more specific instructions. Your treatment has been planned according to current medical practices, but problems sometimes occur. Call your health care provider if you have any problems or questions after your procedure. °What can I expect after the procedure? °After your procedure, it is typical to have the following: °· Bruising at the radial site that usually fades within 1-2 weeks. °· Blood collecting in the tissue (hematoma) that may be painful to the touch. It should usually decrease in size and tenderness within 1-2 weeks. ° °Follow these instructions at home: °· Take medicines only as directed by your health care provider. °· You may shower 24-48 hours after the procedure or as directed by your health care provider. Remove the bandage (dressing) and gently wash the site with plain soap and water. Pat the area dry with a clean towel. Do not rub the site, because this may cause bleeding. °· Do not take baths, swim, or use a hot tub until your health care provider approves. °· Check your insertion site every day for redness, swelling, or drainage. °· Do not apply powder or lotion to the site. °· Do not flex or bend the affected arm for 24 hours or as directed by your health care provider. °· Do not push or pull heavy objects with the affected arm for 24 hours or as directed by your health care provider. °· Do not lift over 10 lb (4.5 kg) for 5 days after your procedure or as directed by your health care provider. °· Ask your health care provider when it is okay to: °? Return to work or school. °? Resume usual physical activities or sports. °? Resume sexual activity. °· Do not drive home if you are discharged the same day as the procedure. Have someone else drive you. °· You may drive 24 hours after the procedure  unless otherwise instructed by your health care provider. °· Do not operate machinery or power tools for 24 hours after the procedure. °· If your procedure was done as an outpatient procedure, which means that you went home the same day as your procedure, a responsible adult should be with you for the first 24 hours after you arrive home. °· Keep all follow-up visits as directed by your health care provider. This is important. °Contact a health care provider if: °· You have a fever. °· You have chills. °· You have increased bleeding from the radial site. Hold pressure on the site. °Get help right away if: °· You have unusual pain at the radial site. °· You have redness, warmth, or swelling at the radial site. °· You have drainage (other than a small amount of blood on the dressing) from the radial site. °· The radial site is bleeding, and the bleeding does not stop after 30 minutes of holding steady pressure on the site. °· Your arm or hand becomes pale, cool, tingly, or numb. °This information is not intended to replace advice given to you by your health care provider. Make sure you discuss any questions you have with your health care provider. °Document Released: 05/01/2010 Document Revised: 09/04/2015 Document Reviewed: 10/15/2013 °Elsevier Interactive Patient Education © 2018 [employer] ° °

## 2017-05-19 NOTE — Research (Signed)
OPTIMIZE Informed Consent   Subject Name: Tanveer Proehl  Subject met inclusion and exclusion criteria.  The informed consent form, study requirements and expectations were reviewed with the subject and questions and concerns were addressed prior to the signing of the consent form.  The subject verbalized understanding of the trail requirements.  The subject agreed to participate in the OPTIMIZE trial and signed the informed consent.  The informed consent was obtained prior to performance of any protocol-specific procedures for the subject.  A copy of the signed informed consent was given to the subject and a copy was placed in the subject's medical record. 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  Hedrick,Tammy W 05/19/2017, 1200  

## 2017-05-20 ENCOUNTER — Encounter (HOSPITAL_COMMUNITY): Payer: Self-pay | Admitting: Interventional Cardiology

## 2017-05-20 MED FILL — Heparin Sodium (Porcine) 2 Unit/ML in Sodium Chloride 0.9%: INTRAMUSCULAR | Qty: 1000 | Status: AC

## 2017-05-20 MED FILL — Lidocaine HCl Local Inj 1%: INTRAMUSCULAR | Qty: 20 | Status: AC

## 2017-05-25 ENCOUNTER — Telehealth: Payer: Self-pay | Admitting: Cardiology

## 2017-05-25 NOTE — Telephone Encounter (Signed)
Other than an appointment on 2/25 with Dr. Percival Spanish, did not see a reason for a call. He has been made aware of the appointment and verbalized his understanding.

## 2017-05-25 NOTE — Telephone Encounter (Signed)
New Message   Patient is returning call from a voicemail that he received on 05/24/2017. He states no details was left other than to call Dr. Percival Spanish office.

## 2017-05-26 ENCOUNTER — Encounter: Payer: Medicare Other | Admitting: Cardiothoracic Surgery

## 2017-05-31 ENCOUNTER — Encounter: Payer: Self-pay | Admitting: Cardiothoracic Surgery

## 2017-05-31 ENCOUNTER — Institutional Professional Consult (permissible substitution) (INDEPENDENT_AMBULATORY_CARE_PROVIDER_SITE_OTHER): Payer: Medicare Other | Admitting: Cardiothoracic Surgery

## 2017-05-31 VITALS — BP 160/69 | HR 48 | Resp 20 | Ht 70.0 in | Wt 190.0 lb

## 2017-05-31 DIAGNOSIS — I2510 Atherosclerotic heart disease of native coronary artery without angina pectoris: Secondary | ICD-10-CM

## 2017-05-31 NOTE — Progress Notes (Signed)
PCP is [name], MD Referring Provider is [name], MD  Chief Complaint  Patient presents with  . 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 Prior to his move he had developed symptoms of angina and arrhythmias. He was recommended to see a cardiologist at the time of his arrival. The move was difficult for him with increased symptoms of chest pain shortness of breath. He recently had a terrible night with shortness of breath and chest tightness which scared his wife.  The patient was seen by Dr. [name] and prepared for cardiac catheterization by Dr. [name]. This demonstrated severe three-vessel coronary disease with chronic occlusion of the RCA which filled via collaterals and a hemodynamically significant moderate 70% stenosis of the LAD. The cervix marginal had an 85-90% stenosis. LV EF was normal. LVEDP was 15. CABG was recommended.  Echocardiogram shows no significant valvular disease.  Past Medical History:  Diagnosis Date  . Elevated coronary artery calcium score   . Hyperlipidemia     Past Surgical History:  Procedure Laterality Date  . INTRAVASCULAR PRESSURE WIRE/FFR STUDY N/A 05/19/2017   Procedure: INTRAVASCULAR PRESSURE WIRE/FFR STUDY;  Surgeon: [name], MD;  Location: [place];  Service: Cardiovascular;  Laterality: N/A;  . LEFT HEART CATH AND CORONARY ANGIOGRAPHY N/A 05/19/2017   Procedure: LEFT HEART CATH AND CORONARY ANGIOGRAPHY;  Surgeon: [name], MD;  Location: [facility];  Service: Cardiovascular;  Laterality: N/A;  . ROTATOR CUFF REPAIR Left     Family History  Problem Relation Age of Onset  . Heart disease Mother 51  . Hypertension Mother   . Cancer  Father        Larynx  . AAA (abdominal aortic aneurysm) Father     Social History Social History   Tobacco Use  . Smoking status: Former Smoker    Last attempt to quit: 05/13/1986    Years since quitting: 31.0  . Smokeless tobacco: Never Used  Substance Use Topics  . Alcohol use: Yes    Alcohol/week: 1.8 oz    Types: 2 Cans of beer, 1 Glasses of wine per week    Comment: daily  . Drug use: No    Current Outpatient Medications  Medication Sig Dispense Refill  . aspirin EC 81 MG tablet Take 81 mg by mouth daily.    . metoprolol tartrate (LOPRESSOR) 25 MG tablet Take 25 mg by mouth 2 (two) times daily.    . rosuvastatin (CRESTOR) 20 MG tablet Take 20 mg by mouth every evening.      No current facility-administered medications for this visit.     Allergies  Allergen Reactions  . Atorvastatin Other (See Comments)    Made pt not like himself    Review of Systems   The patient has had previous left shoulder rotator cuff repair. He recently fell on ice in [place] injuring his right upper arm and shoulder. The patient denies diabetes The patient has a remote history of smoking. The patient drinks 2-3 beers per evening No prior thoracic trauma or surgery No problems with general anesthesia No bleeding diathesis No difficulty swallowing or dental complaints No recent cough or symptoms of upper respiratory infection Previous colonoscopy was clear No symptoms of BPH or  UTI  BP (!) 160/69   Pulse (!) 48   Resp 20   Ht 5\' 10"  (1.778 m)   Wt 190 lb (86.2 kg)   SpO2 96% Comment: RA  BMI 27.26 kg/m  Physical Exam      Exam    General- alert and comfortable    Neck- no JVD, no cervical adenopathy palpable, no carotid bruit   Lungs- clear without rales, wheezes   Cor- regular rate and rhythm, no murmur , gallop   Abdomen- soft, non-tender   Extremities - warm, non-tender, minimal edema   Neuro- oriented, appropriate, no focal weakness   Diagnostic  Tests: Three-vessel CAD preserved LV function by cath and echocardiogram  Impression: I agree with recommendation for multivessel CABG with bypass grafts to LAD, diagonal, OM and posterior descending. Patient recently had nocturnal symptoms which are very concerning so we will proceed with surgery on the first available opening in the schedule, February 26 at [facility] hospital.  Plan: Patient returns for his preop evaluation later this week including Doppler studies and PFTs and return for multivessel CABG on favored 26. I discussed the procedure of CABG in detail with the patient is wife including the benefits and expected recovery and potential risks. He agrees to proceed with surgery.   [name], MD [facility] [phone]

## 2017-06-01 ENCOUNTER — Telehealth: Payer: Self-pay | Admitting: Cardiology

## 2017-06-01 ENCOUNTER — Other Ambulatory Visit: Payer: Self-pay | Admitting: *Deleted

## 2017-06-01 DIAGNOSIS — M25649 Stiffness of unspecified hand, not elsewhere classified: Secondary | ICD-10-CM | POA: Diagnosis not present

## 2017-06-01 DIAGNOSIS — M25511 Pain in right shoulder: Secondary | ICD-10-CM | POA: Diagnosis not present

## 2017-06-01 DIAGNOSIS — R079 Chest pain, unspecified: Secondary | ICD-10-CM | POA: Diagnosis not present

## 2017-06-01 DIAGNOSIS — E785 Hyperlipidemia, unspecified: Secondary | ICD-10-CM | POA: Diagnosis not present

## 2017-06-01 DIAGNOSIS — Z79899 Other long term (current) drug therapy: Secondary | ICD-10-CM | POA: Diagnosis not present

## 2017-06-01 DIAGNOSIS — I2510 Atherosclerotic heart disease of native coronary artery without angina pectoris: Secondary | ICD-10-CM

## 2017-06-01 DIAGNOSIS — R9439 Abnormal result of other cardiovascular function study: Secondary | ICD-10-CM | POA: Diagnosis not present

## 2017-06-01 NOTE — Telephone Encounter (Signed)
New Message     Should pt keep appt for Monday 06/06/17 he is having heart surgery on 06/07/17

## 2017-06-01 NOTE — Telephone Encounter (Signed)
Spoke with patient and he did see Dr Darcey Nora yesterday and is scheduled for CABG next week. Per patient cath site looks fine and no issues at this point.  Discussed with Patrick Ridge PA and no reason for patient to be seen. Advised patient, will call if any problems before next week

## 2017-06-02 NOTE — Pre-Procedure Instructions (Signed)
Patrick Nguyen  06/02/2017      [facility] - [address], [place] [address] [place] [address] Phone: [phone] Fax: [phone]    Your procedure is scheduled on   Tuesday 06/07/17  Report to [facility] at 530 A.M.  Call this number if you have problems the morning of surgery:  [phone]   Remember:  Do not eat food or drink liquids after midnight.  Take these medicines the morning of surgery with A SIP OF WATER - METOPROLOL (LOPRESSOR)  7 days prior to surgery STOP taking any Aspirin(unless otherwise instructed by your surgeon), Aleve, Naproxen, Ibuprofen, Motrin, Advil, Goody's, BC's, all herbal medications, fish oil, and all vitamins    Do not wear jewelry, make-up or nail polish.  Do not wear lotions, powders, or perfumes, or deodorant.  Do not shave 48 hours prior to surgery.  Men may shave face and neck.  Do not bring valuables to the hospital.  [facility] is not responsible for any belongings or valuables.  Contacts, dentures or bridgework may not be worn into surgery.  Leave your suitcase in the car.  After surgery it may be brought to your room.  For patients admitted to the hospital, discharge time will be determined by your treatment team.  Patients discharged the day of surgery will not be allowed to drive home.   Name and phone number of your driver:    Special instructions:  [place] - Preparing for Surgery  Before surgery, you can play an important role.  Because skin is not sterile, your skin needs to be as free of germs as possible.  You can reduce the number of germs on you skin by washing with CHG (chlorahexidine gluconate) soap before surgery.  CHG is an antiseptic cleaner which kills germs and bonds with the skin to continue killing germs even after washing.  Please DO NOT use if you have an allergy to CHG or antibacterial soaps.  If your skin becomes  reddened/irritated stop using the CHG and inform your nurse when you arrive at Short Stay.  Do not shave (including legs and underarms) for at least 48 hours prior to the first CHG shower.  You may shave your face.  Please follow these instructions carefully:   1.  Shower with CHG Soap the night before surgery and the                                morning of Surgery.  2.  If you choose to wash your hair, wash your hair first as usual with your       normal shampoo.  3.  After you shampoo, rinse your hair and body thoroughly to remove the                      Shampoo.  4.  Use CHG as you would any other liquid soap.  You can apply chg directly       to the skin and wash gently with scrungie or a clean washcloth.  5.  Apply the CHG Soap to your body ONLY FROM THE NECK DOWN.        Do not use on open wounds or open sores.  Avoid contact with your eyes,       ears, mouth and genitals (private parts).  Wash genitals (private parts)       with your normal soap.  6.  Wash thoroughly, paying special attention to the area where your surgery        will be performed.  7.  Thoroughly rinse your body with warm water from the neck down.  8.  DO NOT shower/wash with your normal soap after using and rinsing off       the CHG Soap.  9.  Pat yourself dry with a clean towel.            10.  Wear clean pajamas.            11.  Place clean sheets on your bed the night of your first shower and do not        sleep with pets.  Day of Surgery  Do not apply any lotions/deoderants the morning of surgery.  Please wear clean clothes to the hospital/surgery center.    Please read over the following fact sheets that you were given. MRSA Information and Surgical Site Infection Prevention

## 2017-06-03 ENCOUNTER — Encounter (HOSPITAL_COMMUNITY): Payer: Self-pay

## 2017-06-03 ENCOUNTER — Encounter (HOSPITAL_COMMUNITY)
Admission: RE | Admit: 2017-06-03 | Discharge: 2017-06-03 | Disposition: A | Discharge status: Home / Self Care | Payer: Medicare Other | Source: Ambulatory Visit | Attending: Cardiothoracic Surgery | Admitting: Cardiothoracic Surgery

## 2017-06-03 ENCOUNTER — Other Ambulatory Visit: Payer: Self-pay

## 2017-06-03 ENCOUNTER — Ambulatory Visit (HOSPITAL_BASED_OUTPATIENT_CLINIC_OR_DEPARTMENT_OTHER)
Admission: RE | Admit: 2017-06-03 | Discharge: 2017-06-03 | Disposition: A | Discharge status: Home / Self Care | Payer: Medicare Other | Source: Ambulatory Visit | Attending: Cardiothoracic Surgery | Admitting: Cardiothoracic Surgery

## 2017-06-03 ENCOUNTER — Ambulatory Visit (HOSPITAL_COMMUNITY)
Admission: RE | Admit: 2017-06-03 | Discharge: 2017-06-03 | Disposition: A | Discharge status: Home / Self Care | Payer: Medicare Other | Source: Ambulatory Visit | Attending: Cardiothoracic Surgery | Admitting: Cardiothoracic Surgery

## 2017-06-03 DIAGNOSIS — Z01818 Encounter for other preprocedural examination: Secondary | ICD-10-CM | POA: Insufficient documentation

## 2017-06-03 DIAGNOSIS — I2510 Atherosclerotic heart disease of native coronary artery without angina pectoris: Secondary | ICD-10-CM

## 2017-06-03 DIAGNOSIS — J449 Chronic obstructive pulmonary disease, unspecified: Secondary | ICD-10-CM | POA: Diagnosis not present

## 2017-06-03 DIAGNOSIS — I6523 Occlusion and stenosis of bilateral carotid arteries: Secondary | ICD-10-CM | POA: Insufficient documentation

## 2017-06-03 DIAGNOSIS — R942 Abnormal results of pulmonary function studies: Secondary | ICD-10-CM | POA: Diagnosis not present

## 2017-06-03 DIAGNOSIS — R001 Bradycardia, unspecified: Secondary | ICD-10-CM | POA: Diagnosis not present

## 2017-06-03 DIAGNOSIS — R9431 Abnormal electrocardiogram [ECG] [EKG]: Secondary | ICD-10-CM | POA: Insufficient documentation

## 2017-06-03 DIAGNOSIS — J984 Other disorders of lung: Secondary | ICD-10-CM | POA: Diagnosis not present

## 2017-06-03 HISTORY — DX: Dyspnea, unspecified: R06.00

## 2017-06-03 HISTORY — DX: Unspecified osteoarthritis, unspecified site: M19.90

## 2017-06-03 HISTORY — DX: Acute myocardial infarction, unspecified: I21.9

## 2017-06-03 HISTORY — DX: Angina pectoris, unspecified: I20.9

## 2017-06-03 HISTORY — DX: Rosacea, unspecified: L71.9

## 2017-06-03 HISTORY — DX: Essential (primary) hypertension: I10

## 2017-06-03 HISTORY — DX: Gastro-esophageal reflux disease without esophagitis: K21.9

## 2017-06-03 HISTORY — DX: Inflammatory liver disease, unspecified: K75.9

## 2017-06-03 LAB — BLOOD GAS, ARTERIAL
Acid-base deficit: 1.5 mmol/L (ref 0.0–2.0)
Bicarbonate: 22.1 mmol/L (ref 20.0–28.0)
Drawn by: 449841
FIO2: 21
O2 Saturation: 95.7 %
Patient temperature: 98.6
pCO2 arterial: 33.4 mmHg (ref 32.0–48.0)
pH, Arterial: 7.436 (ref 7.350–7.450)
pO2, Arterial: 77.7 mmHg — ABNORMAL LOW (ref 83.0–108.0)

## 2017-06-03 LAB — PULMONARY FUNCTION TEST
DL/VA % pred: 58 %
DL/VA: 2.66 ml/min/mmHg/L
DLCO unc % pred: 45 %
DLCO unc: 14.7 ml/min/mmHg
FEF 25-75 Post: 0.8 L/sec
FEF 25-75 Pre: 0.41 L/sec
FEF2575-%Change-Post: 96 %
FEF2575-%Pred-Post: 36 %
FEF2575-%Pred-Pre: 18 %
FEV1-%Change-Post: 23 %
FEV1-%Pred-Post: 48 %
FEV1-%Pred-Pre: 39 %
FEV1-Post: 1.49 L
FEV1-Pre: 1.21 L
FEV1FVC-%Change-Post: -16 %
FEV1FVC-%Pred-Pre: 65 %
FEV6-%Change-Post: 31 %
FEV6-%Pred-Post: 73 %
FEV6-%Pred-Pre: 55 %
FEV6-Post: 2.92 L
FEV6-Pre: 2.22 L
FEV6FVC-%Change-Post: -10 %
FEV6FVC-%Pred-Post: 83 %
FEV6FVC-%Pred-Pre: 93 %
FVC-%Change-Post: 47 %
FVC-%Pred-Post: 88 %
FVC-%Pred-Pre: 59 %
FVC-Post: 3.73 L
FVC-Pre: 2.54 L
Post FEV1/FVC ratio: 40 %
Post FEV6/FVC ratio: 78 %
Pre FEV1/FVC ratio: 48 %
Pre FEV6/FVC Ratio: 88 %
RV % pred: 178 %
RV: 4.56 L
TLC % pred: 104 %
TLC: 7.37 L

## 2017-06-03 LAB — COMPREHENSIVE METABOLIC PANEL
ALT: 24 U/L (ref 17–63)
AST: 32 U/L (ref 15–41)
Albumin: 3.9 g/dL (ref 3.5–5.0)
Alkaline Phosphatase: 71 U/L (ref 38–126)
Anion gap: 10 (ref 5–15)
BUN: 14 mg/dL (ref 6–20)
CO2: 23 mmol/L (ref 22–32)
Calcium: 9 mg/dL (ref 8.9–10.3)
Chloride: 106 mmol/L (ref 101–111)
Creatinine, Ser: 1.05 mg/dL (ref 0.61–1.24)
GFR calc Af Amer: >60 mL/min (ref >60)
GFR calc non Af Amer: >60 mL/min (ref >60)
Glucose, Bld: 99 mg/dL (ref 65–99)
Potassium: 3.8 mmol/L (ref 3.5–5.1)
Sodium: 139 mmol/L (ref 135–145)
Total Bilirubin: 0.9 mg/dL (ref 0.3–1.2)
Total Protein: 7.2 g/dL (ref 6.5–8.1)

## 2017-06-03 LAB — CBC
HCT: 48 % (ref 39.0–52.0)
Hemoglobin: 15.6 g/dL (ref 13.0–17.0)
MCH: 30.4 pg (ref 26.0–34.0)
MCHC: 32.5 g/dL (ref 30.0–36.0)
MCV: 93.4 fL (ref 78.0–100.0)
Platelets: 234 10*3/uL (ref 150–400)
RBC: 5.14 MIL/uL (ref 4.22–5.81)
RDW: 13 % (ref 11.5–15.5)
WBC: 7.7 10*3/uL (ref 4.0–10.5)

## 2017-06-03 LAB — ABO/RH: ABO/RH(D): O POS

## 2017-06-03 LAB — URINALYSIS, ROUTINE W REFLEX MICROSCOPIC
Bilirubin Urine: NEGATIVE
Glucose, UA: NEGATIVE mg/dL
Hgb urine dipstick: NEGATIVE
Ketones, ur: NEGATIVE mg/dL
Leukocytes, UA: NEGATIVE
Nitrite: NEGATIVE
Protein, ur: NEGATIVE mg/dL
Specific Gravity, Urine: 1.014 (ref 1.005–1.030)
pH: 5 (ref 5.0–8.0)

## 2017-06-03 LAB — APTT: aPTT: 33 seconds (ref 24–36)

## 2017-06-03 LAB — SURGICAL PCR SCREEN
MRSA, PCR: NEGATIVE
Staphylococcus aureus: NEGATIVE

## 2017-06-03 LAB — PROTIME-INR
INR: 0.95
Prothrombin Time: 12.6 seconds (ref 11.4–15.2)

## 2017-06-03 MED ORDER — ALBUTEROL SULFATE (2.5 MG/3ML) 0.083% IN NEBU
2.5000 mg | INHALATION_SOLUTION | Freq: Once | RESPIRATORY_TRACT | Status: AC
  Administered 2017-06-03: 2.5 mg via RESPIRATORY_TRACT

## 2017-06-03 NOTE — Progress Notes (Signed)
Bilateral pre CABG Dopplers completed. 1% to 39% ICA stenosis bilaterally. ABIs  Rt 1.10 lt 1.09, Palmar arch is normal bilaterally. [facility], [place]  06/03/2017, 5:27 PM

## 2017-06-04 LAB — HEMOGLOBIN A1C
Hgb A1c MFr Bld: 5.8 % — ABNORMAL HIGH (ref 4.8–5.6)
Mean Plasma Glucose: 120 mg/dL

## 2017-06-06 ENCOUNTER — Ambulatory Visit: Payer: Medicare Other | Admitting: Cardiology

## 2017-06-06 MED ORDER — PLASMA-LYTE 148 IV SOLN
INTRAVENOUS | Status: AC
  Administered 2017-06-07: 09:00:00
  Filled 2017-06-06: qty 2.5

## 2017-06-06 MED ORDER — POTASSIUM CHLORIDE 2 MEQ/ML IV SOLN
80.0000 meq | INTRAVENOUS | Status: DC
Start: 2017-06-07 — End: 2017-06-07
  Filled 2017-06-06: qty 40

## 2017-06-06 MED ORDER — DEXMEDETOMIDINE HCL IN NACL 400 MCG/100ML IV SOLN
0.1000 ug/kg/h | INTRAVENOUS | Status: DC
  Administered 2017-06-07: 0.7 ug/kg/h via INTRAVENOUS
  Filled 2017-06-06: qty 100

## 2017-06-06 MED ORDER — MAGNESIUM SULFATE 50 % IJ SOLN
40.0000 meq | INTRAMUSCULAR | Status: DC
  Filled 2017-06-06: qty 9.85

## 2017-06-06 MED ORDER — TRANEXAMIC ACID (OHS) BOLUS VIA INFUSION
15.0000 mg/kg | INTRAVENOUS | Status: AC
  Administered 2017-06-07: 1290 mg via INTRAVENOUS
  Filled 2017-06-06: qty 1290

## 2017-06-06 MED ORDER — SODIUM CHLORIDE 0.9 % IV SOLN
INTRAVENOUS | Status: DC
  Filled 2017-06-06: qty 1

## 2017-06-06 MED ORDER — EPINEPHRINE PF 1 MG/ML IJ SOLN
0.0000 ug/min | INTRAVENOUS | Status: DC
  Filled 2017-06-06: qty 4

## 2017-06-06 MED ORDER — MILRINONE LACTATE IN DEXTROSE 20-5 MG/100ML-% IV SOLN
0.1250 ug/kg/min | INTRAVENOUS | Status: AC
  Administered 2017-06-07: 0.25 ug/kg/min via INTRAVENOUS
  Filled 2017-06-06: qty 100

## 2017-06-06 MED ORDER — SODIUM CHLORIDE 0.9 % IV SOLN
1.5000 g | INTRAVENOUS | Status: AC
  Administered 2017-06-07: 1.5 g via INTRAVENOUS
  Filled 2017-06-06: qty 1.5

## 2017-06-06 MED ORDER — SODIUM CHLORIDE 0.9 % IV SOLN
30.0000 ug/min | INTRAVENOUS | Status: DC
  Administered 2017-06-07: 10 ug/min via INTRAVENOUS
  Filled 2017-06-06: qty 2

## 2017-06-06 MED ORDER — SODIUM CHLORIDE 0.9 % IV SOLN
INTRAVENOUS | Status: DC
  Filled 2017-06-06: qty 30

## 2017-06-06 MED ORDER — TRANEXAMIC ACID (OHS) PUMP PRIME SOLUTION
2.0000 mg/kg | INTRAVENOUS | Status: DC
  Filled 2017-06-06: qty 1.72

## 2017-06-06 MED ORDER — NITROGLYCERIN IN D5W 200-5 MCG/ML-% IV SOLN
2.0000 ug/min | INTRAVENOUS | Status: AC
  Administered 2017-06-07: 5 ug/min via INTRAVENOUS
  Filled 2017-06-06: qty 250

## 2017-06-06 MED ORDER — VANCOMYCIN HCL 10 G IV SOLR
1500.0000 mg | INTRAVENOUS | Status: AC
  Administered 2017-06-07: 1500 mg via INTRAVENOUS
  Filled 2017-06-06: qty 1500

## 2017-06-06 MED ORDER — SODIUM CHLORIDE 0.9 % IV SOLN
750.0000 mg | INTRAVENOUS | Status: DC
  Filled 2017-06-06: qty 750

## 2017-06-06 MED ORDER — DOPAMINE-DEXTROSE 3.2-5 MG/ML-% IV SOLN
0.0000 ug/kg/min | INTRAVENOUS | Status: DC
  Filled 2017-06-06: qty 250

## 2017-06-06 MED ORDER — TRANEXAMIC ACID 1000 MG/10ML IV SOLN
1.5000 mg/kg/h | INTRAVENOUS | Status: AC
  Administered 2017-06-07: 1.5 mg/kg/h via INTRAVENOUS
  Filled 2017-06-06: qty 2.5

## 2017-06-06 NOTE — Anesthesia Preprocedure Evaluation (Addendum)
Anesthesia Evaluation  Patient identified by MRN, date of birth, ID band Patient awake    Reviewed: Allergy & Precautions, NPO status , Patient's Chart, lab work & pertinent test results, reviewed documented beta blocker date and time   Airway Mallampati: II  TM Distance: >3 FB Neck ROM: Full    Dental  (+) Edentulous Upper, Edentulous Lower   Pulmonary former smoker,    Pulmonary exam normal breath sounds clear to auscultation       Cardiovascular hypertension, + CAD and + Past MI  Normal cardiovascular exam Rhythm:Regular Rate:Normal  Severe three-vessel coronary disease with chronic total occlusion of the mid RCA which fills from left to right by collaterals via septal perforators.   The large second obtuse marginal branch contains 85% stenosis.  The small first obtuse marginal contains ostial 75% narrowing,  The ostial LAD contains 60-70% stenosis (with threshold significant FFR of 0.78-0.80), and the large first diagonal contains 60% mid vessel stenosis.  Distal LAD contains diffuse 70% narrowing. Left main is normal. Basal inferior hypokinesis with preserved LVEF of 65%.  Normal filling pressures with LVEDP 13 mmHg.   Neuro/Psych negative neurological ROS  negative psych ROS   GI/Hepatic Neg liver ROS, GERD  ,  Endo/Other  negative endocrine ROS  Renal/GU negative Renal ROS     Musculoskeletal negative musculoskeletal ROS (+)   Abdominal   Peds  Hematology HLD   Anesthesia Other Findings   Reproductive/Obstetrics                           Anesthesia Physical Anesthesia Plan  ASA: IV  Anesthesia Plan: General   Post-op Pain Management:    Induction: Intravenous  PONV Risk Score and Plan: 2 and Treatment may vary due to age or medical condition  Airway Management Planned: Oral ETT  Additional Equipment: Arterial line, CVP, PA Cath, TEE and Ultrasound Guidance Line  Placement  Intra-op Plan:   Post-operative Plan: Post-operative intubation/ventilation  Informed Consent: I have reviewed the patients History and Physical, chart, labs and discussed the procedure including the risks, benefits and alternatives for the proposed anesthesia with the patient or authorized representative who has indicated his/her understanding and acceptance.   Dental advisory given  Plan Discussed with: CRNA  Anesthesia Plan Comments:         Anesthesia Quick Evaluation

## 2017-06-07 ENCOUNTER — Inpatient Hospital Stay (HOSPITAL_COMMUNITY): Payer: Medicare Other

## 2017-06-07 ENCOUNTER — Inpatient Hospital Stay (HOSPITAL_COMMUNITY)
Admission: RE | Admit: 2017-06-07 | Discharge: 2017-06-13 | DRG: 236 | Disposition: A | Discharge status: Home / Self Care | Payer: Medicare Other | Attending: Cardiothoracic Surgery | Admitting: Cardiothoracic Surgery

## 2017-06-07 ENCOUNTER — Inpatient Hospital Stay (HOSPITAL_COMMUNITY)
Admission: RE | Disposition: A | Discharge status: Home / Self Care | Payer: Self-pay | Source: Home / Self Care | Attending: Cardiothoracic Surgery

## 2017-06-07 ENCOUNTER — Encounter (HOSPITAL_COMMUNITY): Payer: Self-pay

## 2017-06-07 DIAGNOSIS — Z09 Encounter for follow-up examination after completed treatment for conditions other than malignant neoplasm: Secondary | ICD-10-CM

## 2017-06-07 DIAGNOSIS — I2510 Atherosclerotic heart disease of native coronary artery without angina pectoris: Principal | ICD-10-CM | POA: Diagnosis present

## 2017-06-07 DIAGNOSIS — D62 Acute posthemorrhagic anemia: Secondary | ICD-10-CM | POA: Diagnosis not present

## 2017-06-07 DIAGNOSIS — Z951 Presence of aortocoronary bypass graft: Secondary | ICD-10-CM

## 2017-06-07 DIAGNOSIS — E119 Type 2 diabetes mellitus without complications: Secondary | ICD-10-CM | POA: Diagnosis not present

## 2017-06-07 DIAGNOSIS — Z9689 Presence of other specified functional implants: Secondary | ICD-10-CM

## 2017-06-07 DIAGNOSIS — I493 Ventricular premature depolarization: Secondary | ICD-10-CM | POA: Diagnosis not present

## 2017-06-07 DIAGNOSIS — J9811 Atelectasis: Secondary | ICD-10-CM | POA: Diagnosis not present

## 2017-06-07 DIAGNOSIS — J939 Pneumothorax, unspecified: Secondary | ICD-10-CM | POA: Diagnosis not present

## 2017-06-07 DIAGNOSIS — Z79899 Other long term (current) drug therapy: Secondary | ICD-10-CM

## 2017-06-07 DIAGNOSIS — I252 Old myocardial infarction: Secondary | ICD-10-CM

## 2017-06-07 DIAGNOSIS — Z87891 Personal history of nicotine dependence: Secondary | ICD-10-CM

## 2017-06-07 DIAGNOSIS — I4891 Unspecified atrial fibrillation: Secondary | ICD-10-CM | POA: Diagnosis not present

## 2017-06-07 DIAGNOSIS — I25810 Atherosclerosis of coronary artery bypass graft(s) without angina pectoris: Secondary | ICD-10-CM | POA: Diagnosis not present

## 2017-06-07 DIAGNOSIS — J9382 Other air leak: Secondary | ICD-10-CM | POA: Diagnosis not present

## 2017-06-07 DIAGNOSIS — E785 Hyperlipidemia, unspecified: Secondary | ICD-10-CM | POA: Diagnosis not present

## 2017-06-07 DIAGNOSIS — R0602 Shortness of breath: Secondary | ICD-10-CM | POA: Diagnosis not present

## 2017-06-07 DIAGNOSIS — J90 Pleural effusion, not elsewhere classified: Secondary | ICD-10-CM | POA: Diagnosis not present

## 2017-06-07 DIAGNOSIS — I10 Essential (primary) hypertension: Secondary | ICD-10-CM | POA: Diagnosis present

## 2017-06-07 DIAGNOSIS — I25110 Atherosclerotic heart disease of native coronary artery with unstable angina pectoris: Secondary | ICD-10-CM | POA: Diagnosis not present

## 2017-06-07 DIAGNOSIS — I340 Nonrheumatic mitral (valve) insufficiency: Secondary | ICD-10-CM | POA: Diagnosis not present

## 2017-06-07 HISTORY — PX: TEE WITHOUT CARDIOVERSION: SHX5443

## 2017-06-07 HISTORY — PX: CORONARY ARTERY BYPASS GRAFT: SHX141

## 2017-06-07 LAB — POCT I-STAT, CHEM 8
BUN: 17 mg/dL (ref 6–20)
BUN: 16 mg/dL (ref 6–20)
BUN: 17 mg/dL (ref 6–20)
BUN: 15 mg/dL (ref 6–20)
BUN: 15 mg/dL (ref 6–20)
BUN: 14 mg/dL (ref 6–20)
Calcium, Ion: 1.19 mmol/L (ref 1.15–1.40)
Calcium, Ion: 0.95 mmol/L — ABNORMAL LOW (ref 1.15–1.40)
Calcium, Ion: 1.22 mmol/L (ref 1.15–1.40)
Calcium, Ion: 0.97 mmol/L — ABNORMAL LOW (ref 1.15–1.40)
Calcium, Ion: 0.97 mmol/L — ABNORMAL LOW (ref 1.15–1.40)
Calcium, Ion: 1.06 mmol/L — ABNORMAL LOW (ref 1.15–1.40)
Chloride: 107 mmol/L (ref 101–111)
Chloride: 102 mmol/L (ref 101–111)
Chloride: 106 mmol/L (ref 101–111)
Chloride: 104 mmol/L (ref 101–111)
Chloride: 103 mmol/L (ref 101–111)
Chloride: 107 mmol/L (ref 101–111)
Creatinine, Ser: 0.8 mg/dL (ref 0.61–1.24)
Creatinine, Ser: 0.7 mg/dL (ref 0.61–1.24)
Creatinine, Ser: 0.9 mg/dL (ref 0.61–1.24)
Creatinine, Ser: 0.7 mg/dL (ref 0.61–1.24)
Creatinine, Ser: 0.7 mg/dL (ref 0.61–1.24)
Creatinine, Ser: 0.9 mg/dL (ref 0.61–1.24)
Glucose, Bld: 104 mg/dL — ABNORMAL HIGH (ref 65–99)
Glucose, Bld: 106 mg/dL — ABNORMAL HIGH (ref 65–99)
Glucose, Bld: 116 mg/dL — ABNORMAL HIGH (ref 65–99)
Glucose, Bld: 137 mg/dL — ABNORMAL HIGH (ref 65–99)
Glucose, Bld: 135 mg/dL — ABNORMAL HIGH (ref 65–99)
Glucose, Bld: 131 mg/dL — ABNORMAL HIGH (ref 65–99)
HCT: 40 % (ref 39.0–52.0)
HCT: 29 % — ABNORMAL LOW (ref 39.0–52.0)
HCT: 38 % — ABNORMAL LOW (ref 39.0–52.0)
HCT: 29 % — ABNORMAL LOW (ref 39.0–52.0)
HCT: 29 % — ABNORMAL LOW (ref 39.0–52.0)
HCT: 32 % — ABNORMAL LOW (ref 39.0–52.0)
Hemoglobin: 13.6 g/dL (ref 13.0–17.0)
Hemoglobin: 12.9 g/dL — ABNORMAL LOW (ref 13.0–17.0)
Hemoglobin: 9.9 g/dL — ABNORMAL LOW (ref 13.0–17.0)
Hemoglobin: 9.9 g/dL — ABNORMAL LOW (ref 13.0–17.0)
Hemoglobin: 9.9 g/dL — ABNORMAL LOW (ref 13.0–17.0)
Hemoglobin: 10.9 g/dL — ABNORMAL LOW (ref 13.0–17.0)
Potassium: 4 mmol/L (ref 3.5–5.1)
Potassium: 4.6 mmol/L (ref 3.5–5.1)
Potassium: 4.6 mmol/L (ref 3.5–5.1)
Potassium: 5.6 mmol/L — ABNORMAL HIGH (ref 3.5–5.1)
Potassium: 4.6 mmol/L (ref 3.5–5.1)
Potassium: 3.6 mmol/L (ref 3.5–5.1)
Sodium: 143 mmol/L (ref 135–145)
Sodium: 141 mmol/L (ref 135–145)
Sodium: 142 mmol/L (ref 135–145)
Sodium: 138 mmol/L (ref 135–145)
Sodium: 140 mmol/L (ref 135–145)
Sodium: 143 mmol/L (ref 135–145)
TCO2: 26 mmol/L (ref 22–32)
TCO2: 26 mmol/L (ref 22–32)
TCO2: 29 mmol/L (ref 22–32)
TCO2: 26 mmol/L (ref 22–32)
TCO2: 26 mmol/L (ref 22–32)
TCO2: 23 mmol/L (ref 22–32)

## 2017-06-07 LAB — CREATININE, SERUM
Creatinine, Ser: 0.94 mg/dL (ref 0.61–1.24)
GFR calc Af Amer: >60 mL/min (ref >60)
GFR calc non Af Amer: >60 mL/min (ref >60)

## 2017-06-07 LAB — POCT I-STAT 3, ART BLOOD GAS (G3+)
Acid-Base Excess: 1 mmol/L (ref 0.0–2.0)
Acid-base deficit: 1 mmol/L (ref 0.0–2.0)
Acid-base deficit: 2 mmol/L (ref 0.0–2.0)
Acid-base deficit: 6 mmol/L — ABNORMAL HIGH (ref 0.0–2.0)
Acid-base deficit: 4 mmol/L — ABNORMAL HIGH (ref 0.0–2.0)
Bicarbonate: 26.5 mmol/L (ref 20.0–28.0)
Bicarbonate: 24.7 mmol/L (ref 20.0–28.0)
Bicarbonate: 23.2 mmol/L (ref 20.0–28.0)
Bicarbonate: 19.1 mmol/L — ABNORMAL LOW (ref 20.0–28.0)
Bicarbonate: 22.4 mmol/L (ref 20.0–28.0)
O2 Saturation: 100 %
O2 Saturation: 100 %
O2 Saturation: 95 %
O2 Saturation: 97 %
O2 Saturation: 95 %
Patient temperature: 35.4
Patient temperature: 36.8
Patient temperature: 37
TCO2: 28 mmol/L (ref 22–32)
TCO2: 26 mmol/L (ref 22–32)
TCO2: 24 mmol/L (ref 22–32)
TCO2: 20 mmol/L — ABNORMAL LOW (ref 22–32)
TCO2: 24 mmol/L (ref 22–32)
pCO2 arterial: 43.3 mmHg (ref 32.0–48.0)
pCO2 arterial: 47.4 mmHg (ref 32.0–48.0)
pCO2 arterial: 37.7 mmHg (ref 32.0–48.0)
pCO2 arterial: 33.4 mmHg (ref 32.0–48.0)
pCO2 arterial: 45.7 mmHg (ref 32.0–48.0)
pH, Arterial: 7.395 (ref 7.350–7.450)
pH, Arterial: 7.325 — ABNORMAL LOW (ref 7.350–7.450)
pH, Arterial: 7.39 (ref 7.350–7.450)
pH, Arterial: 7.366 (ref 7.350–7.450)
pH, Arterial: 7.298 — ABNORMAL LOW (ref 7.350–7.450)
pO2, Arterial: 378 mmHg — ABNORMAL HIGH (ref 83.0–108.0)
pO2, Arterial: 442 mmHg — ABNORMAL HIGH (ref 83.0–108.0)
pO2, Arterial: 68 mmHg — ABNORMAL LOW (ref 83.0–108.0)
pO2, Arterial: 91 mmHg (ref 83.0–108.0)
pO2, Arterial: 83 mmHg (ref 83.0–108.0)

## 2017-06-07 LAB — POCT I-STAT 4, (NA,K, GLUC, HGB,HCT)
Glucose, Bld: 123 mg/dL — ABNORMAL HIGH (ref 65–99)
HCT: 31 % — ABNORMAL LOW (ref 39.0–52.0)
Hemoglobin: 10.5 g/dL — ABNORMAL LOW (ref 13.0–17.0)
Potassium: 4.2 mmol/L (ref 3.5–5.1)
Sodium: 143 mmol/L (ref 135–145)

## 2017-06-07 LAB — GLUCOSE, CAPILLARY
Glucose-Capillary: 132 mg/dL — ABNORMAL HIGH (ref 65–99)
Glucose-Capillary: 135 mg/dL — ABNORMAL HIGH (ref 65–99)
Glucose-Capillary: 106 mg/dL — ABNORMAL HIGH (ref 65–99)
Glucose-Capillary: 120 mg/dL — ABNORMAL HIGH (ref 65–99)
Glucose-Capillary: 110 mg/dL — ABNORMAL HIGH (ref 65–99)
Glucose-Capillary: 161 mg/dL — ABNORMAL HIGH (ref 65–99)
Glucose-Capillary: 158 mg/dL — ABNORMAL HIGH (ref 65–99)
Glucose-Capillary: 134 mg/dL — ABNORMAL HIGH (ref 65–99)

## 2017-06-07 LAB — MAGNESIUM: Magnesium: 2.7 mg/dL — ABNORMAL HIGH (ref 1.7–2.4)

## 2017-06-07 LAB — CBC
HCT: 32.1 % — ABNORMAL LOW (ref 39.0–52.0)
HEMATOCRIT: 33.3 % — AB (ref 39.0–52.0)
HEMOGLOBIN: 10.8 g/dL — AB (ref 13.0–17.0)
Hemoglobin: 10.8 g/dL — ABNORMAL LOW (ref 13.0–17.0)
MCH: 29.7 pg (ref 26.0–34.0)
MCH: 30.9 pg (ref 26.0–34.0)
MCHC: 32.4 g/dL (ref 30.0–36.0)
MCHC: 33.6 g/dL (ref 30.0–36.0)
MCV: 91.5 fL (ref 78.0–100.0)
MCV: 91.7 fL (ref 78.0–100.0)
Platelets: 132 10*3/uL — ABNORMAL LOW (ref 150–400)
Platelets: 132 10*3/uL — ABNORMAL LOW (ref 150–400)
RBC: 3.64 MIL/uL — ABNORMAL LOW (ref 4.22–5.81)
RBC: 3.5 MIL/uL — ABNORMAL LOW (ref 4.22–5.81)
RDW: 12.7 % (ref 11.5–15.5)
RDW: 12.9 % (ref 11.5–15.5)
WBC: 11.8 10*3/uL — ABNORMAL HIGH (ref 4.0–10.5)
WBC: 9.5 10*3/uL (ref 4.0–10.5)

## 2017-06-07 LAB — HEMOGLOBIN AND HEMATOCRIT, BLOOD
HCT: 31.4 % — ABNORMAL LOW (ref 39.0–52.0)
Hemoglobin: 10.2 g/dL — ABNORMAL LOW (ref 13.0–17.0)

## 2017-06-07 LAB — PLATELET COUNT: Platelets: 161 10*3/uL (ref 150–400)

## 2017-06-07 LAB — PROTIME-INR
INR: 1.38
Prothrombin Time: 16.8 seconds — ABNORMAL HIGH (ref 11.4–15.2)

## 2017-06-07 LAB — APTT: aPTT: 35 seconds (ref 24–36)

## 2017-06-07 SURGERY — CORONARY ARTERY BYPASS GRAFTING (CABG)
Anesthesia: General | Site: Chest

## 2017-06-07 MED ORDER — METOPROLOL TARTRATE 12.5 MG HALF TABLET
12.5000 mg | ORAL_TABLET | Freq: Two times a day (BID) | ORAL | Status: DC
  Administered 2017-06-08 – 2017-06-10 (×6): 12.5 mg via ORAL
  Filled 2017-06-07 (×8): qty 1

## 2017-06-07 MED ORDER — ROCURONIUM BROMIDE 10 MG/ML (PF) SYRINGE
PREFILLED_SYRINGE | INTRAVENOUS | Status: AC
  Filled 2017-06-07: qty 15

## 2017-06-07 MED ORDER — SODIUM CHLORIDE 0.9 % IJ SOLN
INTRAMUSCULAR | Status: AC
  Filled 2017-06-07: qty 30

## 2017-06-07 MED ORDER — PROTAMINE SULFATE 10 MG/ML IV SOLN
INTRAVENOUS | Status: AC
  Filled 2017-06-07: qty 50

## 2017-06-07 MED ORDER — BISACODYL 10 MG RE SUPP
10.0000 mg | Freq: Every day | RECTAL | Status: DC
  Filled 2017-06-07: qty 1

## 2017-06-07 MED ORDER — ORAL CARE MOUTH RINSE
15.0000 mL | Freq: Four times a day (QID) | OROMUCOSAL | Status: DC
  Administered 2017-06-07: 15 mL via OROMUCOSAL

## 2017-06-07 MED ORDER — EPHEDRINE SULFATE 50 MG/ML IJ SOLN
INTRAMUSCULAR | Status: DC | PRN
  Administered 2017-06-07: 10 mg via INTRAVENOUS

## 2017-06-07 MED ORDER — "THROMBI-PAD 3""X3"" EX PADS"
1.0000 | MEDICATED_PAD | Freq: Once | CUTANEOUS | Status: DC
  Filled 2017-06-07 (×2): qty 1

## 2017-06-07 MED ORDER — METOPROLOL TARTRATE 12.5 MG HALF TABLET
12.5000 mg | ORAL_TABLET | Freq: Once | ORAL | Status: DC
  Filled 2017-06-07: qty 1

## 2017-06-07 MED ORDER — PHENYLEPHRINE 40 MCG/ML (10ML) SYRINGE FOR IV PUSH (FOR BLOOD PRESSURE SUPPORT)
PREFILLED_SYRINGE | INTRAVENOUS | Status: AC
  Filled 2017-06-07: qty 10

## 2017-06-07 MED ORDER — PHENYLEPHRINE HCL 10 MG/ML IJ SOLN
INTRAVENOUS | Status: DC | PRN
  Administered 2017-06-07: 25 ug/min via INTRAVENOUS

## 2017-06-07 MED ORDER — CEFUROXIME SODIUM 1.5 G IV SOLR
1.5000 g | Freq: Two times a day (BID) | INTRAVENOUS | Status: AC
  Administered 2017-06-07 – 2017-06-09 (×4): 1.5 g via INTRAVENOUS
  Filled 2017-06-07 (×4): qty 1.5

## 2017-06-07 MED ORDER — HEMOSTATIC AGENTS (NO CHARGE) OPTIME
TOPICAL | Status: DC | PRN
  Administered 2017-06-07 (×3): 1 via TOPICAL

## 2017-06-07 MED ORDER — METOPROLOL TARTRATE 5 MG/5ML IV SOLN
2.5000 mg | INTRAVENOUS | Status: DC | PRN
  Administered 2017-06-08: 5 mg via INTRAVENOUS
  Filled 2017-06-07 (×2): qty 5

## 2017-06-07 MED ORDER — METOPROLOL TARTRATE 25 MG/10 ML ORAL SUSPENSION: 12.5000 mg | Freq: Two times a day (BID) | ORAL | Status: DC

## 2017-06-07 MED ORDER — LACTATED RINGERS IV SOLN
INTRAVENOUS | Status: DC
  Administered 2017-06-07: 16:00:00 via INTRAVENOUS

## 2017-06-07 MED ORDER — SODIUM CHLORIDE 0.9 % IV SOLN
INTRAVENOUS | Status: DC
  Administered 2017-06-07: 18:00:00 via INTRAVENOUS

## 2017-06-07 MED ORDER — DEXMEDETOMIDINE HCL IN NACL 200 MCG/50ML IV SOLN
INTRAVENOUS | Status: AC
  Filled 2017-06-07: qty 100

## 2017-06-07 MED ORDER — EPHEDRINE 5 MG/ML INJ
INTRAVENOUS | Status: AC
  Filled 2017-06-07: qty 40

## 2017-06-07 MED ORDER — MORPHINE SULFATE (PF) 4 MG/ML IV SOLN
1.0000 mg | INTRAVENOUS | Status: DC | PRN
  Filled 2017-06-07: qty 1

## 2017-06-07 MED ORDER — PANTOPRAZOLE SODIUM 40 MG PO TBEC
40.0000 mg | DELAYED_RELEASE_TABLET | Freq: Every day | ORAL | Status: DC
  Administered 2017-06-08 – 2017-06-13 (×6): 40 mg via ORAL
  Filled 2017-06-07 (×6): qty 1

## 2017-06-07 MED ORDER — SODIUM CHLORIDE 0.9 % IV SOLN
0.0000 ug/min | INTRAVENOUS | Status: DC
  Filled 2017-06-07: qty 2

## 2017-06-07 MED ORDER — 0.9 % SODIUM CHLORIDE (POUR BTL) OPTIME
TOPICAL | Status: DC | PRN
  Administered 2017-06-07: 1000 mL

## 2017-06-07 MED ORDER — GLYCOPYRROLATE 0.2 MG/ML IJ SOLN
INTRAMUSCULAR | Status: DC | PRN
  Administered 2017-06-07: 0.1 mg via INTRAVENOUS
  Administered 2017-06-07: 0.2 mg via INTRAVENOUS

## 2017-06-07 MED ORDER — ARTIFICIAL TEARS OPHTHALMIC OINT
TOPICAL_OINTMENT | OPHTHALMIC | Status: DC | PRN
  Administered 2017-06-07: 1 via OPHTHALMIC

## 2017-06-07 MED ORDER — SODIUM CHLORIDE 0.9% FLUSH
3.0000 mL | Freq: Two times a day (BID) | INTRAVENOUS | Status: DC
  Administered 2017-06-08 – 2017-06-11 (×7): 3 mL via INTRAVENOUS

## 2017-06-07 MED ORDER — FAMOTIDINE 20 MG IN NS 100 ML IVPB
20.0000 mg | Freq: Two times a day (BID) | INTRAVENOUS | Status: AC
  Administered 2017-06-07: 20 mg via INTRAVENOUS
  Filled 2017-06-07 (×2): qty 100

## 2017-06-07 MED ORDER — MILRINONE LACTATE IN DEXTROSE 20-5 MG/100ML-% IV SOLN
0.1250 ug/kg/min | INTRAVENOUS | Status: DC
  Administered 2017-06-08: 0.125 ug/kg/min via INTRAVENOUS
  Filled 2017-06-07: qty 100

## 2017-06-07 MED ORDER — SODIUM CHLORIDE 0.9% FLUSH: 3.0000 mL | INTRAVENOUS | Status: DC | PRN

## 2017-06-07 MED ORDER — PROPOFOL 10 MG/ML IV BOLUS
INTRAVENOUS | Status: DC | PRN
  Administered 2017-06-07 (×2): 50 mg via INTRAVENOUS

## 2017-06-07 MED ORDER — MIDAZOLAM HCL 5 MG/5ML IJ SOLN
INTRAMUSCULAR | Status: DC | PRN
  Administered 2017-06-07: 4 mg via INTRAVENOUS
  Administered 2017-06-07: 1 mg via INTRAVENOUS
  Administered 2017-06-07: 2 mg via INTRAVENOUS

## 2017-06-07 MED ORDER — CHLORHEXIDINE GLUCONATE 0.12% ORAL RINSE (MEDLINE KIT): 15.0000 mL | Freq: Two times a day (BID) | OROMUCOSAL | Status: DC

## 2017-06-07 MED ORDER — ONDANSETRON HCL 4 MG/2ML IJ SOLN
4.0000 mg | Freq: Four times a day (QID) | INTRAMUSCULAR | Status: DC | PRN
  Administered 2017-06-09: 4 mg via INTRAVENOUS
  Filled 2017-06-07: qty 2

## 2017-06-07 MED ORDER — CHLORHEXIDINE GLUCONATE 4 % EX LIQD: 30.0000 mL | CUTANEOUS | Status: DC

## 2017-06-07 MED ORDER — LACTATED RINGERS IV SOLN
INTRAVENOUS | Status: DC | PRN
  Administered 2017-06-07: 07:00:00 via INTRAVENOUS

## 2017-06-07 MED ORDER — SODIUM CHLORIDE 0.9 % IV SOLN
INTRAVENOUS | Status: DC | PRN
  Administered 2017-06-07: .9 [IU]/h via INTRAVENOUS

## 2017-06-07 MED ORDER — MIDAZOLAM HCL 2 MG/2ML IJ SOLN: 2.0000 mg | INTRAMUSCULAR | Status: DC | PRN

## 2017-06-07 MED ORDER — PROTAMINE SULFATE 10 MG/ML IV SOLN
INTRAVENOUS | Status: DC | PRN
  Administered 2017-06-07: 280 mg via INTRAVENOUS
  Administered 2017-06-07: 20 mg via INTRAVENOUS

## 2017-06-07 MED ORDER — ROCURONIUM BROMIDE 10 MG/ML (PF) SYRINGE
PREFILLED_SYRINGE | INTRAVENOUS | Status: DC | PRN
  Administered 2017-06-07: 60 mg via INTRAVENOUS
  Administered 2017-06-07: 50 mg via INTRAVENOUS
  Administered 2017-06-07: 20 mg via INTRAVENOUS
  Administered 2017-06-07: 50 mg via INTRAVENOUS
  Administered 2017-06-07: 30 mg via INTRAVENOUS

## 2017-06-07 MED ORDER — ORAL CARE MOUTH RINSE
15.0000 mL | Freq: Two times a day (BID) | OROMUCOSAL | Status: DC
  Administered 2017-06-08 – 2017-06-13 (×7): 15 mL via OROMUCOSAL

## 2017-06-07 MED ORDER — SODIUM CHLORIDE 0.9 % IV SOLN: 250.0000 mL | INTRAVENOUS | Status: DC

## 2017-06-07 MED ORDER — HEMOSTATIC AGENTS (NO CHARGE) OPTIME
TOPICAL | Status: DC | PRN
  Administered 2017-06-07: 1 via TOPICAL

## 2017-06-07 MED ORDER — ASPIRIN EC 325 MG PO TBEC
325.0000 mg | DELAYED_RELEASE_TABLET | Freq: Every day | ORAL | Status: DC
  Administered 2017-06-08 – 2017-06-13 (×6): 325 mg via ORAL
  Filled 2017-06-07 (×6): qty 1

## 2017-06-07 MED ORDER — ALBUMIN HUMAN 5 % IV SOLN
250.0000 mL | INTRAVENOUS | Status: AC | PRN
  Administered 2017-06-07 (×3): 250 mL via INTRAVENOUS
  Filled 2017-06-07: qty 250

## 2017-06-07 MED ORDER — POTASSIUM CHLORIDE 10 MEQ/50ML IV SOLN: 10.0000 meq | INTRAVENOUS | Status: AC

## 2017-06-07 MED ORDER — ALBUMIN HUMAN 5 % IV SOLN
INTRAVENOUS | Status: DC | PRN
  Administered 2017-06-07 (×2): via INTRAVENOUS

## 2017-06-07 MED ORDER — FENTANYL CITRATE (PF) 250 MCG/5ML IJ SOLN
INTRAMUSCULAR | Status: AC
  Filled 2017-06-07: qty 5

## 2017-06-07 MED ORDER — SODIUM CHLORIDE 0.9 % IJ SOLN
INTRAMUSCULAR | Status: AC
  Filled 2017-06-07: qty 10

## 2017-06-07 MED ORDER — DEXTROSE 5 % IV SOLN
INTRAVENOUS | Status: DC | PRN
  Administered 2017-06-07: 20 ug/min via INTRAVENOUS

## 2017-06-07 MED ORDER — SODIUM CHLORIDE 0.9 % IV SOLN
INTRAVENOUS | Status: DC
  Administered 2017-06-07: 1.5 [IU]/h via INTRAVENOUS
  Filled 2017-06-07: qty 1

## 2017-06-07 MED ORDER — ACETAMINOPHEN 160 MG/5ML PO SOLN: 1000.0000 mg | Freq: Four times a day (QID) | ORAL | Status: AC

## 2017-06-07 MED ORDER — ACETAMINOPHEN 650 MG RE SUPP
650.0000 mg | Freq: Once | RECTAL | Status: AC
  Administered 2017-06-07: 650 mg via RECTAL

## 2017-06-07 MED ORDER — HEPARIN SODIUM (PORCINE) 1000 UNIT/ML IJ SOLN
INTRAMUSCULAR | Status: DC | PRN
  Administered 2017-06-07: 26000 [IU] via INTRAVENOUS
  Administered 2017-06-07 (×2): 2000 [IU] via INTRAVENOUS

## 2017-06-07 MED ORDER — CHLORHEXIDINE GLUCONATE 0.12 % MT SOLN
15.0000 mL | OROMUCOSAL | Status: AC
  Administered 2017-06-07: 15 mL via OROMUCOSAL

## 2017-06-07 MED ORDER — FENTANYL CITRATE (PF) 250 MCG/5ML IJ SOLN
INTRAMUSCULAR | Status: AC
  Filled 2017-06-07: qty 25

## 2017-06-07 MED ORDER — NITROGLYCERIN IN D5W 200-5 MCG/ML-% IV SOLN
0.0000 ug/min | INTRAVENOUS | Status: DC
  Administered 2017-06-07: 20 ug/min via INTRAVENOUS

## 2017-06-07 MED ORDER — ASPIRIN 81 MG PO CHEW
324.0000 mg | CHEWABLE_TABLET | Freq: Every day | ORAL | Status: DC
  Filled 2017-06-07: qty 4

## 2017-06-07 MED ORDER — SODIUM CHLORIDE 0.9 % IV SOLN
0.0000 ug/kg/h | INTRAVENOUS | Status: DC
  Administered 2017-06-07: 0.7 ug/kg/h via INTRAVENOUS
  Filled 2017-06-07: qty 2

## 2017-06-07 MED ORDER — MIDAZOLAM HCL 10 MG/2ML IJ SOLN
INTRAMUSCULAR | Status: AC
  Filled 2017-06-07: qty 2

## 2017-06-07 MED ORDER — OXYCODONE HCL 5 MG PO TABS
5.0000 mg | ORAL_TABLET | ORAL | Status: DC | PRN
  Administered 2017-06-08: 10 mg via ORAL
  Administered 2017-06-08: 5 mg via ORAL
  Administered 2017-06-08: 10 mg via ORAL
  Filled 2017-06-07: qty 1
  Filled 2017-06-07 (×2): qty 2

## 2017-06-07 MED ORDER — NITROGLYCERIN 0.2 MG/ML ON CALL CATH LAB
INTRAVENOUS | Status: DC | PRN
  Administered 2017-06-07 (×3): 20 ug via INTRAVENOUS

## 2017-06-07 MED ORDER — FENTANYL CITRATE (PF) 100 MCG/2ML IJ SOLN
INTRAMUSCULAR | Status: DC | PRN
  Administered 2017-06-07: 100 ug via INTRAVENOUS
  Administered 2017-06-07 (×2): 50 ug via INTRAVENOUS
  Administered 2017-06-07: 100 ug via INTRAVENOUS
  Administered 2017-06-07: 150 ug via INTRAVENOUS
  Administered 2017-06-07 (×2): 50 ug via INTRAVENOUS
  Administered 2017-06-07: 100 ug via INTRAVENOUS
  Administered 2017-06-07 (×6): 50 ug via INTRAVENOUS
  Administered 2017-06-07: 150 ug via INTRAVENOUS
  Administered 2017-06-07 (×3): 50 ug via INTRAVENOUS
  Administered 2017-06-07: 200 ug via INTRAVENOUS
  Administered 2017-06-07: 150 ug via INTRAVENOUS
  Administered 2017-06-07: 100 ug via INTRAVENOUS
  Administered 2017-06-07: 50 ug via INTRAVENOUS

## 2017-06-07 MED ORDER — SODIUM CHLORIDE 0.45 % IV SOLN: INTRAVENOUS | Status: DC | PRN

## 2017-06-07 MED ORDER — SODIUM CHLORIDE 0.9 % IV SOLN: Freq: Once | INTRAVENOUS | Status: DC

## 2017-06-07 MED ORDER — DOCUSATE SODIUM 100 MG PO CAPS
200.0000 mg | ORAL_CAPSULE | Freq: Every day | ORAL | Status: DC
  Administered 2017-06-08 – 2017-06-13 (×4): 200 mg via ORAL
  Filled 2017-06-07 (×5): qty 2

## 2017-06-07 MED ORDER — ACETAMINOPHEN 160 MG/5ML PO SOLN: 650.0000 mg | Freq: Once | ORAL | Status: AC

## 2017-06-07 MED ORDER — VANCOMYCIN HCL IN DEXTROSE 1-5 GM/200ML-% IV SOLN
1000.0000 mg | Freq: Once | INTRAVENOUS | Status: AC
  Administered 2017-06-07: 1000 mg via INTRAVENOUS
  Filled 2017-06-07: qty 200

## 2017-06-07 MED ORDER — PROTAMINE SULFATE 10 MG/ML IV SOLN
INTRAVENOUS | Status: AC
  Filled 2017-06-07: qty 10

## 2017-06-07 MED ORDER — MORPHINE SULFATE (PF) 4 MG/ML IV SOLN
2.0000 mg | INTRAVENOUS | Status: DC | PRN
  Administered 2017-06-07 – 2017-06-09 (×5): 4 mg via INTRAVENOUS
  Filled 2017-06-07 (×5): qty 1

## 2017-06-07 MED ORDER — TRAMADOL HCL 50 MG PO TABS
50.0000 mg | ORAL_TABLET | ORAL | Status: DC | PRN
  Administered 2017-06-08: 50 mg via ORAL
  Administered 2017-06-08: 100 mg via ORAL
  Filled 2017-06-07: qty 2
  Filled 2017-06-07: qty 1

## 2017-06-07 MED ORDER — FENTANYL CITRATE (PF) 250 MCG/5ML IJ SOLN
INTRAMUSCULAR | Status: AC
Start: 2017-06-07 — End: 2017-06-07
  Filled 2017-06-07: qty 5

## 2017-06-07 MED ORDER — HEPARIN SODIUM (PORCINE) 1000 UNIT/ML IJ SOLN
INTRAMUSCULAR | Status: AC
  Filled 2017-06-07: qty 2

## 2017-06-07 MED ORDER — MORPHINE SULFATE (PF) 2 MG/ML IV SOLN: 1.0000 mg | INTRAVENOUS | Status: DC | PRN

## 2017-06-07 MED ORDER — INSULIN REGULAR BOLUS VIA INFUSION
0.0000 [IU] | Freq: Three times a day (TID) | INTRAVENOUS | Status: DC
  Filled 2017-06-07: qty 10

## 2017-06-07 MED ORDER — ROSUVASTATIN CALCIUM 10 MG PO TABS
20.0000 mg | ORAL_TABLET | Freq: Every evening | ORAL | Status: DC
  Administered 2017-06-08 – 2017-06-12 (×4): 20 mg via ORAL
  Filled 2017-06-07 (×2): qty 1
  Filled 2017-06-07 (×2): qty 2

## 2017-06-07 MED ORDER — MAGNESIUM SULFATE 4 GM/100ML IV SOLN
4.0000 g | Freq: Once | INTRAVENOUS | Status: AC
  Administered 2017-06-07: 4 g via INTRAVENOUS
  Filled 2017-06-07: qty 100

## 2017-06-07 MED ORDER — CHLORHEXIDINE GLUCONATE 0.12 % MT SOLN
15.0000 mL | Freq: Once | OROMUCOSAL | Status: AC
  Administered 2017-06-07: 15 mL via OROMUCOSAL
  Filled 2017-06-07: qty 15

## 2017-06-07 MED ORDER — ACETAMINOPHEN 500 MG PO TABS
1000.0000 mg | ORAL_TABLET | Freq: Four times a day (QID) | ORAL | Status: AC
  Administered 2017-06-08 – 2017-06-12 (×14): 1000 mg via ORAL
  Filled 2017-06-07 (×15): qty 2

## 2017-06-07 MED ORDER — SODIUM BICARBONATE 8.4 % IV SOLN
50.0000 meq | Freq: Once | INTRAVENOUS | Status: AC
  Administered 2017-06-07: 50 meq via INTRAVENOUS

## 2017-06-07 MED ORDER — LIDOCAINE 2% (20 MG/ML) 5 ML SYRINGE
INTRAMUSCULAR | Status: AC
  Filled 2017-06-07: qty 5

## 2017-06-07 MED ORDER — CEFUROXIME SODIUM 750 MG IJ SOLR
INTRAMUSCULAR | Status: DC | PRN
  Administered 2017-06-07: 750 mg via INTRAVENOUS

## 2017-06-07 MED ORDER — PROPOFOL 10 MG/ML IV BOLUS
INTRAVENOUS | Status: AC
  Filled 2017-06-07: qty 20

## 2017-06-07 MED ORDER — MORPHINE SULFATE (PF) 2 MG/ML IV SOLN: 2.0000 mg | INTRAVENOUS | Status: DC | PRN

## 2017-06-07 MED ORDER — LACTATED RINGERS IV SOLN: 500.0000 mL | Freq: Once | INTRAVENOUS | Status: DC | PRN

## 2017-06-07 MED ORDER — BISACODYL 5 MG PO TBEC
10.0000 mg | DELAYED_RELEASE_TABLET | Freq: Every day | ORAL | Status: DC
  Administered 2017-06-08 – 2017-06-13 (×4): 10 mg via ORAL
  Filled 2017-06-07 (×4): qty 2

## 2017-06-07 MED ORDER — DEXMEDETOMIDINE HCL IN NACL 200 MCG/50ML IV SOLN
INTRAVENOUS | Status: DC | PRN
  Administered 2017-06-07: .3 ug/kg/h via INTRAVENOUS

## 2017-06-07 MED ORDER — LACTATED RINGERS IV SOLN
INTRAVENOUS | Status: DC
  Administered 2017-06-07 (×2): via INTRAVENOUS

## 2017-06-07 MED ORDER — POTASSIUM CHLORIDE 10 MEQ/50ML IV SOLN
10.0000 meq | INTRAVENOUS | Status: AC
  Administered 2017-06-07 – 2017-06-08 (×3): 10 meq via INTRAVENOUS

## 2017-06-07 SURGICAL SUPPLY — 92 items
ADAPTER CARDIO PERF ANTE/RETRO (ADAPTER) ×4 IMPLANT
BAG DECANTER FOR FLEXI CONT (MISCELLANEOUS) ×4 IMPLANT
BANDAGE ACE 4X5 VEL STRL LF (GAUZE/BANDAGES/DRESSINGS) ×4 IMPLANT
BANDAGE ACE 6X5 VEL STRL LF (GAUZE/BANDAGES/DRESSINGS) ×4 IMPLANT
BASKET HEART  (ORDER IN 25'S) (MISCELLANEOUS) ×1
BASKET HEART (ORDER IN 25'S) (MISCELLANEOUS) ×1
BASKET HEART (ORDER IN 25S) (MISCELLANEOUS) ×2 IMPLANT
BLADE CLIPPER SURG (BLADE) IMPLANT
BLADE STERNUM SYSTEM 6 (BLADE) ×4 IMPLANT
BLADE SURG 12 STRL SS (BLADE) ×4 IMPLANT
BNDG GAUZE ELAST 4 BULKY (GAUZE/BANDAGES/DRESSINGS) ×4 IMPLANT
CANISTER SUCT 3000ML PPV (MISCELLANEOUS) ×4 IMPLANT
CANNULA GUNDRY RCSP 15FR (MISCELLANEOUS) ×4 IMPLANT
CATH CPB KIT VANTRIGT (MISCELLANEOUS) ×4 IMPLANT
CATH ROBINSON RED A/P 18FR (CATHETERS) ×12 IMPLANT
CATH THORACIC 36FR RT ANG (CATHETERS) ×4 IMPLANT
CLIP RETRACTION 3.0MM CORONARY (MISCELLANEOUS) ×4 IMPLANT
CLIP VESOCCLUDE SM WIDE 24/CT (CLIP) ×4 IMPLANT
CRADLE DONUT ADULT HEAD (MISCELLANEOUS) ×4 IMPLANT
DRAIN CHANNEL 32F RND 10.7 FF (WOUND CARE) ×4 IMPLANT
DRAPE CARDIOVASCULAR INCISE (DRAPES) ×2
DRAPE SLUSH/WARMER DISC (DRAPES) ×4 IMPLANT
DRAPE SRG 135X102X78XABS (DRAPES) ×2 IMPLANT
DRSG AQUACEL AG ADV 3.5X14 (GAUZE/BANDAGES/DRESSINGS) ×4 IMPLANT
ELECT BLADE 4.0 EZ CLEAN MEGAD (MISCELLANEOUS) ×4
ELECT BLADE 6.5 EXT (BLADE) ×4 IMPLANT
ELECT CAUTERY BLADE 6.4 (BLADE) ×4 IMPLANT
ELECT REM PT RETURN 9FT ADLT (ELECTROSURGICAL) ×8
ELECTRODE BLDE 4.0 EZ CLN MEGD (MISCELLANEOUS) ×2 IMPLANT
ELECTRODE REM PT RTRN 9FT ADLT (ELECTROSURGICAL) ×4 IMPLANT
FELT TEFLON 1X6 (MISCELLANEOUS) ×8 IMPLANT
GAUZE SPONGE 4X4 12PLY STRL (GAUZE/BANDAGES/DRESSINGS) ×8 IMPLANT
GLOVE BIO SURGEON STRL SZ7.5 (GLOVE) ×12 IMPLANT
GLOVE BIO SURGEON STRL SZ8.5 (GLOVE) ×12 IMPLANT
GOWN STRL REUS W/ TWL LRG LVL3 (GOWN DISPOSABLE) ×8 IMPLANT
GOWN STRL REUS W/TWL LRG LVL3 (GOWN DISPOSABLE) ×8
HEMOSTAT POWDER SURGIFOAM 1G (HEMOSTASIS) ×12 IMPLANT
HEMOSTAT SURGICEL 2X14 (HEMOSTASIS) ×4 IMPLANT
INSERT FOGARTY XLG (MISCELLANEOUS) IMPLANT
KIT BASIN OR (CUSTOM PROCEDURE TRAY) ×4 IMPLANT
KIT ROOM TURNOVER OR (KITS) ×4 IMPLANT
KIT SUCTION CATH 14FR (SUCTIONS) ×4 IMPLANT
KIT VASOVIEW HEMOPRO VH 3000 (KITS) ×4 IMPLANT
LEAD PACING MYOCARDI (MISCELLANEOUS) ×4 IMPLANT
MARKER GRAFT CORONARY BYPASS (MISCELLANEOUS) ×12 IMPLANT
NS IRRIG 1000ML POUR BTL (IV SOLUTION) ×20 IMPLANT
PACK E OPEN HEART (SUTURE) ×4 IMPLANT
PACK OPEN HEART (CUSTOM PROCEDURE TRAY) ×4 IMPLANT
PAD ARMBOARD 7.5X6 YLW CONV (MISCELLANEOUS) ×8 IMPLANT
PAD ELECT DEFIB RADIOL ZOLL (MISCELLANEOUS) ×4 IMPLANT
PENCIL BUTTON HOLSTER BLD 10FT (ELECTRODE) ×4 IMPLANT
PUNCH AORTIC ROTATE  4.5MM 8IN (MISCELLANEOUS) ×4 IMPLANT
PUNCH AORTIC ROTATE 4.0MM (MISCELLANEOUS) IMPLANT
PUNCH AORTIC ROTATE 4.5MM 8IN (MISCELLANEOUS) IMPLANT
PUNCH AORTIC ROTATE 5MM 8IN (MISCELLANEOUS) IMPLANT
SET CARDIOPLEGIA MPS 5001102 (MISCELLANEOUS) ×4 IMPLANT
SPONGE LAP 18X18 X RAY DECT (DISPOSABLE) ×4 IMPLANT
SPONGE LAP 4X18 X RAY DECT (DISPOSABLE) ×4 IMPLANT
SURGIFLO W/THROMBIN 8M KIT (HEMOSTASIS) ×4 IMPLANT
SUT BONE WAX W31G (SUTURE) ×4 IMPLANT
SUT MNCRL AB 4-0 PS2 18 (SUTURE) IMPLANT
SUT PROLENE 3 0 SH DA (SUTURE) IMPLANT
SUT PROLENE 3 0 SH1 36 (SUTURE) IMPLANT
SUT PROLENE 4 0 RB 1 (SUTURE) ×2
SUT PROLENE 4 0 SH DA (SUTURE) ×4 IMPLANT
SUT PROLENE 4-0 RB1 .5 CRCL 36 (SUTURE) ×2 IMPLANT
SUT PROLENE 5 0 C 1 36 (SUTURE) ×4 IMPLANT
SUT PROLENE 6 0 C 1 30 (SUTURE) ×20 IMPLANT
SUT PROLENE 6 0 CC (SUTURE) ×12 IMPLANT
SUT PROLENE 8 0 BV175 6 (SUTURE) ×4 IMPLANT
SUT PROLENE BLUE 7 0 (SUTURE) ×8 IMPLANT
SUT SILK  1 MH (SUTURE)
SUT SILK 1 MH (SUTURE) IMPLANT
SUT SILK 2 0 SH CR/8 (SUTURE) ×4 IMPLANT
SUT SILK 3 0 SH CR/8 (SUTURE) IMPLANT
SUT STEEL 6MS V (SUTURE) ×8 IMPLANT
SUT STEEL SZ 6 DBL 3X14 BALL (SUTURE) ×4 IMPLANT
SUT VIC AB 1 CTX 36 (SUTURE) ×4
SUT VIC AB 1 CTX36XBRD ANBCTR (SUTURE) ×4 IMPLANT
SUT VIC AB 2-0 CT1 27 (SUTURE) ×2
SUT VIC AB 2-0 CT1 TAPERPNT 27 (SUTURE) ×2 IMPLANT
SUT VIC AB 2-0 CTX 27 (SUTURE) IMPLANT
SUT VIC AB 3-0 X1 27 (SUTURE) ×8 IMPLANT
SYSTEM SAHARA CHEST DRAIN ATS (WOUND CARE) ×4 IMPLANT
TAPE CLOTH SURG 4X10 WHT LF (GAUZE/BANDAGES/DRESSINGS) ×4 IMPLANT
TAPE PAPER 2X10 WHT MICROPORE (GAUZE/BANDAGES/DRESSINGS) ×4 IMPLANT
TOWEL GREEN STERILE (TOWEL DISPOSABLE) ×4 IMPLANT
TOWEL GREEN STERILE FF (TOWEL DISPOSABLE) ×4 IMPLANT
TRAY FOLEY SILVER 16FR TEMP (SET/KITS/TRAYS/PACK) ×4 IMPLANT
TUBING INSUFFLATION (TUBING) ×4 IMPLANT
UNDERPAD 30X30 (UNDERPADS AND DIAPERS) ×4 IMPLANT
WATER STERILE IRR 1000ML POUR (IV SOLUTION) ×8 IMPLANT

## 2017-06-07 NOTE — Procedures (Signed)
Extubation Procedure Note  Patient Details:   Name: Patrick Nguyen DOB: [birth year] MRN: [id number]   Airway Documentation:   Patient extubated per protocol. Pt had acceptable NIF and VC. Pt had positive cuff leak prior to extubation. Pt has strong cough and is able to voice. Incentive Spirometry instructed with teach back. Patient achieved 750 cc x5.   Evaluation  O2 sats: stable throughout Complications: No apparent complications Patient did tolerate procedure well. Bilateral Breath Sounds: Clear, Diminished   Yes  Ander Purpura 06/07/2017, 6:59 PM

## 2017-06-07 NOTE — Anesthesia Procedure Notes (Signed)
Procedure Name: Intubation Date/Time: 06/07/2017 7:57 AM Performed by: Glynda Jaeger, CRNA Pre-anesthesia Checklist: Patient identified, Emergency Drugs available, Suction available and Patient being monitored Patient Re-evaluated:Patient Re-evaluated prior to induction Oxygen Delivery Method: Circle System Utilized Preoxygenation: Pre-oxygenation with 100% oxygen Induction Type: IV induction Ventilation: Mask ventilation without difficulty, Two handed mask ventilation required and Oral airway inserted - appropriate to patient size Laryngoscope Size: Sabra Heck and 2 Grade View: Grade I Tube type: Oral Tube size: 8.0 mm Number of attempts: 1 Airway Equipment and Method: Stylet and Oral airway Placement Confirmation: ETT inserted through vocal cords under direct vision,  positive ETCO2 and breath sounds checked- equal and bilateral Secured at: 23 cm Tube secured with: Tape Dental Injury: Teeth and Oropharynx as per pre-operative assessment  Comments: Intubation done by Girard Cooter, SRNA

## 2017-06-07 NOTE — Progress Notes (Signed)
Patient ID: Patrick Nguyen, male   DOB: Feb 17, 1941, 77 y.o.   MRN: [id number]  TCTS Evening Rounds:   Hemodynamically stable  CI = 3 on milrinone 0.25  Extubated  Urine output good  CT output low  CBC    Component Value Date/Time   WBC 9.5 06/07/2017 2003   RBC 3.50 (L) 06/07/2017 2003   HGB 10.9 (L) 06/07/2017 2015   HGB 15.4 05/16/2017 1016   HCT 32.0 (L) 06/07/2017 2015   HCT 45.3 05/16/2017 1016   PLT 132 (L) 06/07/2017 2003   PLT 231 05/16/2017 1016   MCV 91.7 06/07/2017 2003   MCV 90 05/16/2017 1016   MCH 30.9 06/07/2017 2003   MCHC 33.6 06/07/2017 2003   RDW 12.9 06/07/2017 2003   RDW 13.4 05/16/2017 1016     BMET    Component Value Date/Time   NA 143 06/07/2017 2015   NA 141 05/16/2017 1016   K 3.6 06/07/2017 2015   CL 107 06/07/2017 2015   CO2 23 06/03/2017 0921   GLUCOSE 131 (H) 06/07/2017 2015   BUN 14 06/07/2017 2015   BUN 14 05/16/2017 1016   CREATININE 0.90 06/07/2017 2015   CALCIUM 9.0 06/03/2017 0921   GFRNONAA >60 06/07/2017 2003   GFRAA >60 06/07/2017 2003     A/P:  Stable postop course. Continue current plans

## 2017-06-07 NOTE — Anesthesia Procedure Notes (Signed)
Arterial Line Insertion Start/End2/26/2019 6:45 AM, 06/07/2017 7:03 AM Performed by: Glynda Jaeger, CRNA, CRNA  Preanesthetic checklist: patient identified, IV checked, site marked, risks and benefits discussed, surgical consent, monitors and equipment checked, pre-op evaluation, timeout performed and anesthesia consent Lidocaine 1% used for infiltration and patient sedated Right, radial was placed Catheter size: 20 G Hand hygiene performed  and maximum sterile barriers used  Allen's test indicative of satisfactory collateral circulation Attempts: 2 Procedure performed without using ultrasound guided technique. Following insertion, dressing applied and Biopatch. Post procedure assessment: normal  Patient tolerated the procedure well with no immediate complications. Additional procedure comments: Preformed by A Magri SRNA.

## 2017-06-07 NOTE — Anesthesia Procedure Notes (Addendum)
Central Venous Catheter Insertion Performed by: Duane Boston, MD, anesthesiologist Start/End2/26/2019 6:38 AM, 06/07/2017 6:48 AM Patient location: Pre-op. Preanesthetic checklist: patient identified, IV checked, site marked, risks and benefits discussed, surgical consent, monitors and equipment checked, pre-op evaluation, timeout performed and anesthesia consent Position: Trendelenburg Lidocaine 1% used for infiltration and patient sedated Hand hygiene performed , maximum sterile barriers used  and Seldinger technique used Catheter size: 8.5 Fr Total catheter length 8. PA cath was placed.Sheath introducer Swan type:thermodilution PA Cath depth:50 Procedure performed using ultrasound guided technique. Ultrasound Notes:anatomy identified, needle tip was noted to be adjacent to the nerve/plexus identified, no ultrasound evidence of intravascular and/or intraneural injection and image(s) printed for medical record Attempts: 1 Following insertion, line sutured, dressing applied and Biopatch. Post procedure assessment: free fluid flow, blood return through all ports and no air  Patient tolerated the procedure well with no immediate complications.

## 2017-06-07 NOTE — Brief Op Note (Signed)
06/07/2017  11:37 AM  PATIENT:  [name]  77 y.o. male  PRE-OPERATIVE DIAGNOSIS:  CAD  POST-OPERATIVE DIAGNOSIS:  CAD  PROCEDURE:  Procedure(s): CORONARY ARTERY BYPASS GRAFTING (CABG) x , using left internal mammary artery and right leg greater saphenous vein harvested endoscopically (N/A) TRANSESOPHAGEAL ECHOCARDIOGRAM (TEE) (N/A) LIMA-LAD SVG-OM SVG-PD  SURGEON:  Surgeon(s) and Role:    Ivin Poot, MD - Primary  PHYSICIAN ASSISTANT: Karenann Mcgrory PA-C  ANESTHESIA:   general  EBL:  SE ANESTH/PERFUSION RECORDS   BLOOD ADMINISTERED:none  DRAINS: CHEST DRAINS IN PLEURAL SPACE AND MEDIASTINUM   LOCAL MEDICATIONS USED:  NONE  SPECIMEN:  No Specimen  DISPOSITION OF SPECIMEN:  N/A  COUNTS:  YES  TOURNIQUET:  * No tourniquets in log *  DICTATION: .Other Dictation: Dictation Number PENDING  PLAN OF CARE: Admit to inpatient   PATIENT DISPOSITION:  ICU - intubated and hemodynamically stable.   Delay start of Pharmacological VTE agent (>24hrs) due to surgical blood loss or risk of bleeding: yes

## 2017-06-07 NOTE — OR Nursing (Signed)
12:30 - 45 minute call to SICU 13:05 - 20 minute call to SICU

## 2017-06-07 NOTE — Transfer of Care (Signed)
Immediate Anesthesia Transfer of Care Note  Patient: Patrick Nguyen  Procedure(s) Performed: CORONARY ARTERY BYPASS GRAFTING (CABG) x three, using left internal mammary artery and right leg greater saphenous vein harvested endoscopically (N/A Chest) TRANSESOPHAGEAL ECHOCARDIOGRAM (TEE) (N/A )  Patient Location: ICU  Anesthesia Type:General  Level of Consciousness: Patient remains intubated per anesthesia plan  Airway & Oxygen Therapy: Patient remains intubated per anesthesia plan and Patient placed on Ventilator (see vital sign flow sheet for setting)  Post-op Assessment: Post -op Vital signs reviewed and stable  Post vital signs: Reviewed and stable  Last Vitals:  Vitals:   06/07/17 0711 06/07/17 0712  BP:    Pulse: (!) 46 (!) 48  Resp: 15 15  Temp:    SpO2: 96% 96%    Last Pain:  Vitals:   06/07/17 0555  TempSrc: Oral      Patients Stated Pain Goal: 3 (55/21/74 7159)  Complications: No apparent anesthesia complications

## 2017-06-07 NOTE — Progress Notes (Signed)
Pt introducer bleeding from site. Thrombi Pad ordered. Lucianne Lei Trigt notified. Pressure held for 74mins and bleeding stopped. Dressing replaced with not sign of addition bleeding. Will continue to monitor pt.

## 2017-06-08 ENCOUNTER — Inpatient Hospital Stay (HOSPITAL_COMMUNITY): Payer: Medicare Other

## 2017-06-08 ENCOUNTER — Encounter (HOSPITAL_COMMUNITY): Payer: Self-pay | Admitting: Cardiothoracic Surgery

## 2017-06-08 ENCOUNTER — Other Ambulatory Visit: Payer: Self-pay

## 2017-06-08 LAB — POCT I-STAT, CHEM 8
BUN: 15 mg/dL (ref 6–20)
Calcium, Ion: 1.13 mmol/L — ABNORMAL LOW (ref 1.15–1.40)
Chloride: 104 mmol/L (ref 101–111)
Creatinine, Ser: 1 mg/dL (ref 0.61–1.24)
Glucose, Bld: 113 mg/dL — ABNORMAL HIGH (ref 65–99)
HCT: 30 % — ABNORMAL LOW (ref 39.0–52.0)
Hemoglobin: 10.2 g/dL — ABNORMAL LOW (ref 13.0–17.0)
Potassium: 4.1 mmol/L (ref 3.5–5.1)
Sodium: 137 mmol/L (ref 135–145)
TCO2: 25 mmol/L (ref 22–32)

## 2017-06-08 LAB — TYPE AND SCREEN
ABO/RH(D): O POS
Antibody Screen: NEGATIVE

## 2017-06-08 LAB — BPAM FFP
BLOOD PRODUCT EXPIRATION DATE: 201903022359
Blood Product Expiration Date: 201903022359
ISSUE DATE / TIME: 201902261135
ISSUE DATE / TIME: 201902261135
UNIT TYPE AND RH: 6200
Unit Type and Rh: 6200

## 2017-06-08 LAB — PREPARE FRESH FROZEN PLASMA
Unit division: 0
Unit division: 0

## 2017-06-08 LAB — GLUCOSE, CAPILLARY
Glucose-Capillary: 111 mg/dL — ABNORMAL HIGH (ref 65–99)
Glucose-Capillary: 116 mg/dL — ABNORMAL HIGH (ref 65–99)
Glucose-Capillary: 117 mg/dL — ABNORMAL HIGH (ref 65–99)
Glucose-Capillary: 117 mg/dL — ABNORMAL HIGH (ref 65–99)
Glucose-Capillary: 117 mg/dL — ABNORMAL HIGH (ref 65–99)
Glucose-Capillary: 121 mg/dL — ABNORMAL HIGH (ref 65–99)
Glucose-Capillary: 121 mg/dL — ABNORMAL HIGH (ref 65–99)
Glucose-Capillary: 122 mg/dL — ABNORMAL HIGH (ref 65–99)
Glucose-Capillary: 122 mg/dL — ABNORMAL HIGH (ref 65–99)
Glucose-Capillary: 126 mg/dL — ABNORMAL HIGH (ref 65–99)
Glucose-Capillary: 126 mg/dL — ABNORMAL HIGH (ref 65–99)
Glucose-Capillary: 128 mg/dL — ABNORMAL HIGH (ref 65–99)
Glucose-Capillary: 130 mg/dL — ABNORMAL HIGH (ref 65–99)
Glucose-Capillary: 132 mg/dL — ABNORMAL HIGH (ref 65–99)
Glucose-Capillary: 133 mg/dL — ABNORMAL HIGH (ref 65–99)
Glucose-Capillary: 141 mg/dL — ABNORMAL HIGH (ref 65–99)
Glucose-Capillary: 80 mg/dL (ref 65–99)

## 2017-06-08 LAB — CBC
HCT: 31.2 % — ABNORMAL LOW (ref 39.0–52.0)
HEMATOCRIT: 29.3 % — AB (ref 39.0–52.0)
HEMOGLOBIN: 9.6 g/dL — AB (ref 13.0–17.0)
Hemoglobin: 9.8 g/dL — ABNORMAL LOW (ref 13.0–17.0)
MCH: 30.4 pg (ref 26.0–34.0)
MCH: 29.3 pg (ref 26.0–34.0)
MCHC: 32.8 g/dL (ref 30.0–36.0)
MCHC: 31.4 g/dL (ref 30.0–36.0)
MCV: 92.7 fL (ref 78.0–100.0)
MCV: 93.4 fL (ref 78.0–100.0)
Platelets: 120 10*3/uL — ABNORMAL LOW (ref 150–400)
Platelets: 146 10*3/uL — ABNORMAL LOW (ref 150–400)
RBC: 3.16 MIL/uL — ABNORMAL LOW (ref 4.22–5.81)
RBC: 3.34 MIL/uL — ABNORMAL LOW (ref 4.22–5.81)
RDW: 13.1 % (ref 11.5–15.5)
RDW: 13.2 % (ref 11.5–15.5)
WBC: 8.6 10*3/uL (ref 4.0–10.5)
WBC: 13 10*3/uL — ABNORMAL HIGH (ref 4.0–10.5)

## 2017-06-08 LAB — ECHO TEE: FS: 36 % (ref 28–44)

## 2017-06-08 LAB — CREATININE, SERUM
Creatinine, Ser: 1.2 mg/dL (ref 0.61–1.24)
GFR calc Af Amer: >60 mL/min (ref >60)
GFR calc non Af Amer: 58 mL/min — ABNORMAL LOW (ref >60)

## 2017-06-08 LAB — MAGNESIUM
Magnesium: 2.5 mg/dL — ABNORMAL HIGH (ref 1.7–2.4)
Magnesium: 2.4 mg/dL (ref 1.7–2.4)

## 2017-06-08 LAB — BASIC METABOLIC PANEL
Anion gap: 6 (ref 5–15)
BUN: 12 mg/dL (ref 6–20)
CHLORIDE: 110 mmol/L (ref 101–111)
CO2: 24 mmol/L (ref 22–32)
CREATININE: 0.95 mg/dL (ref 0.61–1.24)
Calcium: 7.3 mg/dL — ABNORMAL LOW (ref 8.9–10.3)
GFR calc Af Amer: >60 mL/min (ref >60)
GFR calc non Af Amer: >60 mL/min (ref >60)
GLUCOSE: 122 mg/dL — AB (ref 65–99)
Potassium: 3.7 mmol/L (ref 3.5–5.1)
Sodium: 140 mmol/L (ref 135–145)

## 2017-06-08 MED ORDER — FUROSEMIDE 10 MG/ML IJ SOLN
40.0000 mg | Freq: Two times a day (BID) | INTRAMUSCULAR | Status: DC
  Administered 2017-06-09 (×2): 40 mg via INTRAVENOUS
  Filled 2017-06-08 (×3): qty 4

## 2017-06-08 MED ORDER — POTASSIUM CHLORIDE CRYS ER 20 MEQ PO TBCR
20.0000 meq | EXTENDED_RELEASE_TABLET | Freq: Two times a day (BID) | ORAL | Status: DC
  Administered 2017-06-08 – 2017-06-10 (×6): 20 meq via ORAL
  Filled 2017-06-08 (×7): qty 1

## 2017-06-08 MED ORDER — METOCLOPRAMIDE HCL 5 MG/ML IJ SOLN
10.0000 mg | Freq: Four times a day (QID) | INTRAMUSCULAR | Status: AC
  Administered 2017-06-08 – 2017-06-10 (×8): 10 mg via INTRAVENOUS
  Filled 2017-06-08 (×6): qty 2

## 2017-06-08 MED ORDER — VANCOMYCIN HCL IN DEXTROSE 1-5 GM/200ML-% IV SOLN
1000.0000 mg | Freq: Once | INTRAVENOUS | Status: AC
  Administered 2017-06-08: 1000 mg via INTRAVENOUS
  Filled 2017-06-08: qty 200

## 2017-06-08 MED ORDER — FUROSEMIDE 10 MG/ML IJ SOLN
20.0000 mg | Freq: Two times a day (BID) | INTRAMUSCULAR | Status: DC
  Administered 2017-06-08 (×2): 20 mg via INTRAVENOUS

## 2017-06-08 MED ORDER — LISINOPRIL 10 MG PO TABS
10.0000 mg | ORAL_TABLET | Freq: Every day | ORAL | Status: DC
  Administered 2017-06-08 – 2017-06-13 (×6): 10 mg via ORAL
  Filled 2017-06-08 (×6): qty 1

## 2017-06-08 MED ORDER — AMIODARONE HCL IN DEXTROSE 360-4.14 MG/200ML-% IV SOLN
30.0000 mg/h | INTRAVENOUS | Status: DC
  Administered 2017-06-09 (×3): 30 mg/h via INTRAVENOUS
  Filled 2017-06-08 (×3): qty 200

## 2017-06-08 MED ORDER — AMIODARONE LOAD VIA INFUSION
150.0000 mg | Freq: Once | INTRAVENOUS | Status: AC
  Administered 2017-06-08: 150 mg via INTRAVENOUS
  Filled 2017-06-08: qty 83.34

## 2017-06-08 MED ORDER — INSULIN ASPART 100 UNIT/ML ~~LOC~~ SOLN: 0.0000 [IU] | SUBCUTANEOUS | Status: DC

## 2017-06-08 MED ORDER — AMIODARONE HCL 200 MG PO TABS: 200.0000 mg | ORAL_TABLET | Freq: Every day | ORAL | Status: DC

## 2017-06-08 MED ORDER — CHLORHEXIDINE GLUCONATE CLOTH 2 % EX PADS
6.0000 | MEDICATED_PAD | Freq: Every day | CUTANEOUS | Status: DC
  Administered 2017-06-10 – 2017-06-11 (×2): 6 via TOPICAL

## 2017-06-08 MED ORDER — AMIODARONE HCL IN DEXTROSE 360-4.14 MG/200ML-% IV SOLN
60.0000 mg/h | INTRAVENOUS | Status: DC
  Administered 2017-06-08: 60 mg/h via INTRAVENOUS
  Filled 2017-06-08 (×2): qty 200

## 2017-06-08 MED ORDER — POTASSIUM CHLORIDE 10 MEQ/50ML IV SOLN
10.0000 meq | INTRAVENOUS | Status: AC
  Administered 2017-06-08 (×3): 10 meq via INTRAVENOUS
  Filled 2017-06-08 (×3): qty 50

## 2017-06-08 MED ORDER — AMIODARONE HCL 200 MG PO TABS: 200.0000 mg | ORAL_TABLET | Freq: Two times a day (BID) | ORAL | Status: DC

## 2017-06-08 MED FILL — Potassium Chloride Inj 2 mEq/ML: INTRAVENOUS | Qty: 20 | Status: AC

## 2017-06-08 MED FILL — Dexmedetomidine HCl in NaCl 0.9% IV Soln 400 MCG/100ML: INTRAVENOUS | Qty: 100 | Status: AC

## 2017-06-08 MED FILL — Magnesium Sulfate Inj 50%: INTRAMUSCULAR | Qty: 10 | Status: AC

## 2017-06-08 MED FILL — Heparin Sodium (Porcine) Inj 1000 Unit/ML: INTRAMUSCULAR | Qty: 30 | Status: AC

## 2017-06-08 NOTE — Anesthesia Postprocedure Evaluation (Signed)
Anesthesia Post Note  Patient: [name]  Procedure(s) Performed: CORONARY ARTERY BYPASS GRAFTING (CABG) x three, using left internal mammary artery and right leg greater saphenous vein harvested endoscopically (N/A Chest) TRANSESOPHAGEAL ECHOCARDIOGRAM (TEE) (N/A )     Patient location during evaluation: SICU Anesthesia Type: General Level of consciousness: awake Pain management: pain level controlled Vital Signs Assessment: post-procedure vital signs reviewed and stable Respiratory status: spontaneous breathing, nonlabored ventilation and respiratory function stable Cardiovascular status: stable Postop Assessment: no apparent nausea or vomiting Anesthetic complications: no    Last Vitals:  Vitals:   06/08/17 1934 06/08/17 2000  BP:  (!) 114/48  Pulse:  64  Resp: 18 16  Temp:    SpO2: 97% 96%    Last Pain:  Vitals:   06/08/17 2000  TempSrc:   PainSc: 3                  [name]

## 2017-06-08 NOTE — Progress Notes (Signed)
1 Day Post-Op Procedure(s) (LRB): CORONARY ARTERY BYPASS GRAFTING (CABG) x three, using left internal mammary artery and right leg greater saphenous vein harvested endoscopically (N/A) TRANSESOPHAGEAL ECHOCARDIOGRAM (TEE) (N/A) Subjective: Stable after CABGx3 BP high Objective: Vital signs in last 24 hours: Temp:  [95.4 F (35.2 C)-99.5 F (37.5 C)] 99 F (37.2 C) (02/27 0700) Pulse Rate:  [65-87] 72 (02/27 0700) Cardiac Rhythm: Normal sinus rhythm;Atrial paced (02/27 0200) Resp:  [5-26] 22 (02/27 0700) BP: (99-134)/(50-75) 120/52 (02/27 0700) SpO2:  [92 %-100 %] 98 % (02/27 0700) Arterial Line BP: (103-176)/(42-70) 156/46 (02/27 0700) FiO2 (%):  [40 %-50 %] 40 % (02/26 1827) Weight:  [196 lb 14.4 oz (89.3 kg)] 196 lb 14.4 oz (89.3 kg) (02/27 0500)  Hemodynamic parameters for last 24 hours: PAP: (19-38)/(5-17) 26/8 CO:  [3.9 L/min-6.6 L/min] 6.6 L/min CI:  [1.9 L/min/m2-3.2 L/min/m2] 3.2 L/min/m2  Intake/Output from previous day: 02/26 0701 - 02/27 0700 In: 5657.8 [P.O.:180; I.V.:2939.8; Blood:918; IV [id number]:1275] Out: 1700 [Urine:2850; Blood:1000; Chest Tube:330] Intake/Output this shift: Total I/O In: -  Out: 40 [Urine:40]       Exam    General- alert and comfortable    Neck- no JVD, no cervical adenopathy palpable, no carotid bruit   Lungs- clear without rales, wheezes   Cor- regular rate and rhythm, no murmur , gallop   Abdomen- soft, non-tender   Extremities - warm, non-tender, minimal edema   Neuro- oriented, appropriate, no focal weakness   Lab Results: Recent Labs    06/07/17 2003 06/07/17 2015 06/08/17 0351  WBC 9.5  --  8.6  HGB 10.8* 10.9* 9.6*  HCT 32.1* 32.0* 29.3*  PLT 132*  --  120*   BMET:  Recent Labs    06/07/17 2015 06/08/17 0351  NA 143 140  K 3.6 3.7  CL 107 110  CO2  --  24  GLUCOSE 131* 122*  BUN 14 12  CREATININE 0.90 0.95  CALCIUM  --  7.3*    PT/INR:  Recent Labs    06/07/17 1350  LABPROT 16.8*  INR 1.38    ABG    Component Value Date/Time   PHART 7.298 (L) 06/07/2017 2009   HCO3 22.4 06/07/2017 2009   TCO2 23 06/07/2017 2015   ACIDBASEDEF 4.0 (H) 06/07/2017 2009   O2SAT 95.0 06/07/2017 2009   CBG (last 3)  Recent Labs    06/08/17 0612 06/08/17 0647 06/08/17 0754  GLUCAP 126* 126* 80    Assessment/Plan: S/P Procedure(s) (LRB): CORONARY ARTERY BYPASS GRAFTING (CABG) x three, using left internal mammary artery and right leg greater saphenous vein harvested endoscopically (N/A) TRANSESOPHAGEAL ECHOCARDIOGRAM (TEE) (N/A) Mobilize Diuresis Diabetes control d/c tubes/lines See progression orders Small air leak from R lung- leave MT  LOS: 1 day    Tharon Aquas Trigt III 06/08/2017

## 2017-06-08 NOTE — Op Note (Deleted)
  The note originally documented on this encounter has been moved the the encounter in which it belongs.  

## 2017-06-08 NOTE — Progress Notes (Signed)
  Amiodarone Drug - Drug Interaction Consult Note  Recommendations: Monitor QTc closely with scheduled Reglan use Monitor K closely in setting of Lasix use  Amiodarone is metabolized by the cytochrome P450 system and therefore has the potential to cause many drug interactions. Amiodarone has an average plasma half-life of 50 days (range 20 to 100 days).   There is potential for drug interactions to occur several weeks or months after stopping treatment and the onset of drug interactions may be slow after initiating amiodarone.   [x]  Statins: Increased risk of myopathy. Simvastatin- restrict dose to 20mg  daily. Other statins: counsel patients to report any muscle pain or weakness immediately.  -OK with crestor  []  Anticoagulants: Amiodarone can increase anticoagulant effect. Consider warfarin dose reduction. Patients should be monitored closely and the dose of anticoagulant altered accordingly, remembering that amiodarone levels take several weeks to stabilize.  []  Antiepileptics: Amiodarone can increase plasma concentration of phenytoin, the dose should be reduced. Note that small changes in phenytoin dose can result in large changes in levels. Monitor patient and counsel on signs of toxicity.  [x]  Beta blockers: increased risk of bradycardia, AV block and myocardial depression. Sotalol - avoid concomitant use.  []   Calcium channel blockers (diltiazem and verapamil): increased risk of bradycardia, AV block and myocardial depression.  []   Cyclosporine: Amiodarone increases levels of cyclosporine. Reduced dose of cyclosporine is recommended.  []  Digoxin dose should be halved when amiodarone is started.  [x]  Diuretics: increased risk of cardiotoxicity if hypokalemia occurs.  []  Oral hypoglycemic agents (glyburide, glipizide, glimepiride): increased risk of hypoglycemia. Patient's glucose levels should be monitored closely when initiating amiodarone therapy.   [x]  Drugs that prolong the QT  interval:  Torsades de pointes risk may be increased with concurrent use - avoid if possible.  Monitor QTc, also keep magnesium/potassium WNL if concurrent therapy can't be avoided. Marland Kitchen Antibiotics: e.g. fluoroquinolones, erythromycin. . Antiarrhythmics: e.g. quinidine, procainamide, disopyramide, sotalol. . Antipsychotics: e.g. phenothiazines, haloperidol.  . Lithium, tricyclic antidepressants, and methadone. Thank You,  Narda Bonds  06/08/2017 11:12 PM

## 2017-06-08 NOTE — Progress Notes (Signed)
Patient ID: Logon Uttech, male   DOB: 05/10/1940, 77 y.o.   MRN: [id number] EVENING ROUNDS NOTE :     [address]       [place] [address]             [phone]                 1 Day Post-Op Procedure(s) (LRB): CORONARY ARTERY BYPASS GRAFTING (CABG) x three, using left internal mammary artery and right leg greater saphenous vein harvested endoscopically (N/A) TRANSESOPHAGEAL ECHOCARDIOGRAM (TEE) (N/A)  Total Length of Stay:  LOS: 1 day  BP (!) 115/52   Pulse 67   Temp 97.7 F (36.5 C) (Oral)   Resp (!) 23   Ht 5\' 10"  (1.778 m)   Wt 196 lb 14.4 oz (89.3 kg)   SpO2 96%   BMI 28.25 kg/m   .Intake/Output      02/26 0701 - 02/27 0700 02/27 0701 - 02/28 0700   P.O. 180 240   I.V. (mL/kg) 2939.8 (32.9) 264.2 (3)   Blood 918    Other 20    IV Piggyback 1600 100   Total Intake(mL/kg) 5657.8 (63.4) 604.2 (6.8)   Urine (mL/kg/hr) 2850 (1.3) 540 (0.5)   Blood 1000    Chest Tube 330 130   Total Output 4180 670   Net +1477.8 -65.8          . sodium chloride Stopped (06/08/17 0900)  . sodium chloride Stopped (06/08/17 0900)  . sodium chloride Stopped (06/08/17 0900)  . cefUROXime (ZINACEF)  IV Stopped (06/08/17 0830)  . lactated ringers Stopped (06/08/17 0800)  . lactated ringers 10 mL/hr at 06/08/17 0800  . milrinone 0.125 mcg/kg/min (06/08/17 1726)  . nitroGLYCERIN Stopped (06/08/17 1100)  . phenylephrine (NEO-SYNEPHRINE) Adult infusion Stopped (06/07/17 2134)     Lab Results  Component Value Date   WBC 13.0 (H) 06/08/2017   HGB 10.2 (L) 06/08/2017   HCT 30.0 (L) 06/08/2017   PLT 146 (L) 06/08/2017   GLUCOSE 113 (H) 06/08/2017   ALT 24 06/03/2017   AST 32 06/03/2017   NA 137 06/08/2017   K 4.1 06/08/2017   CL 104 06/08/2017   CREATININE 1.00 06/08/2017   BUN 15 06/08/2017   CO2 24 06/08/2017   TSH 2.260 05/16/2017   INR 1.38 06/07/2017   HGBA1C 5.8 (H) 06/03/2017   bp better On milrinone .125 Stable day Sinus rhythm    Grace Isaac MD  Beeper [phone] Office [phone] 06/08/2017 6:50 PM

## 2017-06-08 NOTE — Progress Notes (Signed)
Pt went into Afib (rate 100-130s) at 22:20. Given 5mg  IV Lopressor, rate now 80-100s. Dr. Servando Snare notified, given verbal orders for Amiodarone bolus and infusion order set, including transition orders to 200mg  PO amio per order set parameters as well as pharmacy consult.   Will administer and continue to monitor.   Henreitta Leber, RN 06/08/17

## 2017-06-08 NOTE — Op Note (Signed)
Patrick Nguyen, PEPPER NO.:  [id number]  MEDICAL RECORD NO.:  [id number]  LOCATION:  RESPT                        FACILITY:  [facility]  PHYSICIAN:  Ivin Poot, M.D.  DATE OF BIRTH:  22-Dec-1940  DATE OF PROCEDURE:  06/07/2017 DATE OF DISCHARGE:                              OPERATIVE REPORT   OPERATIONS: 1. Coronary artery bypass grafting x3 (left internal mammary artery to     left anterior descending, saphenous vein graft to obtuse marginal,     saphenous vein graft to posterior descending). 2. Endoscopic harvest of right leg greater saphenous vein.  SURGEON:  Ivin Poot, MD.  ASSISTANT:  John Giovanni, PA-C.  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  CLINICAL NOTE:  The patient is a 77 year old male, reformed smoker with positive family history of CAD and dyslipidemia, [place].  During the process of moving, he had developed chest pains and arrhythmias and had an assessment in [place].  He was recommended to see a cardiologist after he arrived here for cardiac catheterization.  He was evaluated by Dr. Percival Spanish who felt the patient was having symptoms of progressive angina including some angina arrest and a cardiac catheterization was performed.  This showed severe 3- vessel coronary artery disease with preserved LV function.  Surgical coronary revascularization was recommended and I saw the patient in consultation in the office and after reviewing his images of coronary angiograms, discussed the role of CABG for treatment of his severe CAD. I discussed the procedure in detail with the patient and his wife including the details of surgery involved with general anesthesia, cardiopulmonary bypass, location of the surgical incisions, and the expected  postoperative hospital recovery.  I discussed the risks to him of the operation including the risks of stroke, MI, bleeding, blood transfusion requirement, infection, postoperative pulmonary problems including pleural effusion, postoperative arrhythmia, postoperative organ failure, and death.  After reviewing these issues, he demonstrated his understanding and agreed to proceed with surgery under what I felt was an informed consent.  OPERATIVE FINDINGS: 1. Severe diffuse coronary artery disease with difficult targets,     adequate conduit. 2. No packed cell transfusion required for the surgery. 3. Preserved LV systolic function after separation from     cardiopulmonary bypass.  DESCRIPTION OF PROCEDURE:  The patient was brought to the operating room and placed supine on the operating table.  General anesthesia was induced under invasive hemodynamic monitoring.  A transesophageal echo probe was placed by the Anesthesia team.  The patient was prepped and draped as a sterile field.  A proper time-out was performed.  A sternal incision was made as the saphenous vein was harvested endoscopically from the right leg.  The left internal mammary artery was harvested as a pedicle graft from its origin at the subclavian vessels.  It was a 1.5- mm vessel with good flow.  The sternal retractor was placed, and the pericardium was opened and suspended.  Pursestrings were placed in the ascending aorta and right atrium, and heparin was administered.  When  the ACT was documented as being therapeutic, the patient was cannulated and placed on cardiopulmonary bypass.  The coronaries were identified for grafting, and the mammary artery and vein grafts were prepared for the distal anastomoses.  The diagonal branch to the LAD was too small to graft.  The patient was cooled to 32 degrees and the aortic crossclamp was applied.  One liter of cold blood cardioplegia was delivered in split doses between the  antegrade aortic and retrograde coronary sinus catheters.  There was good cardioplegic arrest, and septal temperature dropped less than 12 degrees.  Cardioplegia was redosed every 20 minutes while the crossclamp was in place.  The distal coronary anastomoses were performed.  The first distal anastomosis was to posterior descending.  This was a 1.5-mm vessel with high-grade proximal stenosis.  A reversed saphenous vein was sewn end-to- side with running 7-0 Prolene with good flow through the graft. Cardioplegia was redosed.  The second distal anastomosis was placed to the OM branch of the left coronary artery.  There was a 90% stenosis in this vessel.  A reversed saphenous vein was sewn end-to-side with running 7-0 Prolene to this 1.7- mm vessel.  There was good flow through the graft.  Cardioplegia was redosed.  The third distal anastomosis was to the distal LAD.  There was a proximal 75% stenosis, which was noted to be hemodynamically significant by FFR measurement.  The left IMA pedicle was brought through an opening, and the left lateral pericardium was brought down onto the LAD and sewn end-to-side with a running 8-0 Prolene.  There was good flow through the anastomosis after briefly releasing the pedicle bulldog on the mammary artery.  The bulldog was reapplied and the pedicle was secured to the epicardium.  Cardioplegia was redosed.  While the crossclamp was still in place, 2 proximal vein anastomoses were performed on the ascending aorta using a 4.5-mm punch and running 6- 0 Prolene.  Prior to tying down the final proximal anastomosis, air was vented from the coronaries with a dose of retrograde warm blood cardioplegia.  The crossclamp was removed.  The vein grafts were de-aired and opened and each had good flow. Hemostasis was documented at the proximal and distal sites.  The patient was rewarmed and reperfused.  The cardioplegia cannulas were removed. Temporary pacing  wires were applied.  The lungs were expanded and the ventilator was resumed.  The patient was weaned off cardiopulmonary bypass on low-dose milrinone with excellent hemodynamics and normal global LV function by echo.  Protamine was administered without adverse reaction.  The cannulas were removed.  The mediastinum was irrigated with warm antibiotic irrigation.  The patient tended to be hypertensive after separation from cardiopulmonary bypass and required titration of medications by the Anesthesia team.  Anterior mediastinal and left pleural drains were placed and brought out through separate incisions. The sternum was closed with interrupted wire.  The patient remained stable.  The pectoralis fascia was closed with a running #1 Vicryl, and the skin and subcutaneous layer were closed with running Vicryl.  Total cardiopulmonary bypass time was 101 minutes.     Ivin Poot, M.D.     PV/MEDQ  D:  06/08/2017  T:  06/08/2017  Job:  [id number]

## 2017-06-09 ENCOUNTER — Inpatient Hospital Stay (HOSPITAL_COMMUNITY): Payer: Medicare Other

## 2017-06-09 LAB — GLUCOSE, CAPILLARY
Glucose-Capillary: 118 mg/dL — ABNORMAL HIGH (ref 65–99)
Glucose-Capillary: 117 mg/dL — ABNORMAL HIGH (ref 65–99)
Glucose-Capillary: 123 mg/dL — ABNORMAL HIGH (ref 65–99)
Glucose-Capillary: 101 mg/dL — ABNORMAL HIGH (ref 65–99)

## 2017-06-09 LAB — CBC
HCT: 30.5 % — ABNORMAL LOW (ref 39.0–52.0)
Hemoglobin: 9.7 g/dL — ABNORMAL LOW (ref 13.0–17.0)
MCH: 30.1 pg (ref 26.0–34.0)
MCHC: 31.8 g/dL (ref 30.0–36.0)
MCV: 94.7 fL (ref 78.0–100.0)
Platelets: 136 10*3/uL — ABNORMAL LOW (ref 150–400)
RBC: 3.22 MIL/uL — ABNORMAL LOW (ref 4.22–5.81)
RDW: 13.3 % (ref 11.5–15.5)
WBC: 11.9 10*3/uL — ABNORMAL HIGH (ref 4.0–10.5)

## 2017-06-09 LAB — POCT I-STAT, CHEM 8
BUN: 17 mg/dL (ref 6–20)
Calcium, Ion: 1.09 mmol/L — ABNORMAL LOW (ref 1.15–1.40)
Chloride: 99 mmol/L — ABNORMAL LOW (ref 101–111)
Creatinine, Ser: 1 mg/dL (ref 0.61–1.24)
Glucose, Bld: 124 mg/dL — ABNORMAL HIGH (ref 65–99)
HCT: 32 % — ABNORMAL LOW (ref 39.0–52.0)
Hemoglobin: 10.9 g/dL — ABNORMAL LOW (ref 13.0–17.0)
Potassium: 4.1 mmol/L (ref 3.5–5.1)
Sodium: 136 mmol/L (ref 135–145)
TCO2: 26 mmol/L (ref 22–32)

## 2017-06-09 LAB — COOXEMETRY PANEL
Carboxyhemoglobin: 1.1 % (ref 0.5–1.5)
Methemoglobin: 1.4 % (ref 0.0–1.5)
O2 Saturation: 64.5 %
Total hemoglobin: 9.9 g/dL — ABNORMAL LOW (ref 12.0–16.0)

## 2017-06-09 LAB — BASIC METABOLIC PANEL
Anion gap: 7 (ref 5–15)
BUN: 14 mg/dL (ref 6–20)
CO2: 25 mmol/L (ref 22–32)
Calcium: 7.7 mg/dL — ABNORMAL LOW (ref 8.9–10.3)
Chloride: 103 mmol/L (ref 101–111)
Creatinine, Ser: 1.03 mg/dL (ref 0.61–1.24)
GFR calc Af Amer: >60 mL/min (ref >60)
GFR calc non Af Amer: >60 mL/min (ref >60)
Glucose, Bld: 118 mg/dL — ABNORMAL HIGH (ref 65–99)
Potassium: 4.1 mmol/L (ref 3.5–5.1)
Sodium: 135 mmol/L (ref 135–145)

## 2017-06-09 MED ORDER — KETOROLAC TROMETHAMINE 15 MG/ML IJ SOLN
15.0000 mg | Freq: Four times a day (QID) | INTRAMUSCULAR | Status: DC
  Administered 2017-06-09 – 2017-06-10 (×5): 15 mg via INTRAVENOUS
  Filled 2017-06-09 (×4): qty 1

## 2017-06-09 MED ORDER — SIMETHICONE 80 MG PO CHEW
80.0000 mg | CHEWABLE_TABLET | Freq: Four times a day (QID) | ORAL | Status: DC | PRN
  Administered 2017-06-09 – 2017-06-10 (×2): 80 mg via ORAL
  Filled 2017-06-09 (×2): qty 1

## 2017-06-09 MED ORDER — ENOXAPARIN SODIUM 40 MG/0.4ML ~~LOC~~ SOLN
40.0000 mg | SUBCUTANEOUS | Status: DC
  Administered 2017-06-09 – 2017-06-13 (×5): 40 mg via SUBCUTANEOUS
  Filled 2017-06-09 (×5): qty 0.4

## 2017-06-09 MED ORDER — INSULIN ASPART 100 UNIT/ML ~~LOC~~ SOLN: 0.0000 [IU] | Freq: Every day | SUBCUTANEOUS | Status: DC

## 2017-06-09 MED ORDER — CYCLOBENZAPRINE HCL 10 MG PO TABS
10.0000 mg | ORAL_TABLET | Freq: Every day | ORAL | Status: DC
  Administered 2017-06-09 – 2017-06-12 (×4): 10 mg via ORAL
  Filled 2017-06-09 (×4): qty 1

## 2017-06-09 MED ORDER — INSULIN ASPART 100 UNIT/ML ~~LOC~~ SOLN
0.0000 [IU] | Freq: Three times a day (TID) | SUBCUTANEOUS | Status: DC
Start: 2017-06-09 — End: 2017-06-10
  Administered 2017-06-09 (×2): 2 [IU] via SUBCUTANEOUS

## 2017-06-09 NOTE — Progress Notes (Signed)
2 Days Post-Op Procedure(s) (LRB): CORONARY ARTERY BYPASS GRAFTING (CABG) x three, using left internal mammary artery and right leg greater saphenous vein harvested endoscopically (N/A) TRANSESOPHAGEAL ECHOCARDIOGRAM (TEE) (N/A) Subjective: Postop a-fib- controlled on amio drip Small R PNTX with intermittent air leak from MT Walked in hall Objective: Vital signs in last 24 hours: Temp:  [97.7 F (36.5 C)-99 F (37.2 C)] 98.2 F (36.8 C) (02/28 0746) Pulse Rate:  [60-121] 98 (02/28 0800) Cardiac Rhythm: Atrial fibrillation (02/28 0800) Resp:  [10-25] 17 (02/28 0800) BP: (83-138)/(46-121) 128/70 (02/28 0800) SpO2:  [89 %-98 %] 94 % (02/28 0800) Arterial Line BP: (114-171)/(43-54) 157/48 (02/27 1700) Weight:  [197 lb 14.4 oz (89.8 kg)] 197 lb 14.4 oz (89.8 kg) (02/28 0500)  Hemodynamic parameters for last 24 hours: PAP: (29-34)/(9-12) 29/9  Intake/Output from previous day: 02/27 0701 - 02/28 0700 In: 1337.3 [P.O.:330; I.V.:807.3; IV Piggyback:200] Out: 1410 [Urine:1240; Chest Tube:170] Intake/Output this shift: Total I/O In: 29.9 [I.V.:29.9] Out: 75 [Urine:75]       Exam    General- alert and comfortable    Neck- no JVD, no cervical adenopathy palpable, no carotid bruit   Lungs- clear without rales, wheezes   Cor- regular rate and rhythm, no murmur , gallop   Abdomen- soft, non-tender   Extremities - warm, non-tender, minimal edema   Neuro- oriented, appropriate, no focal weakness   Lab Results: Recent Labs    06/08/17 1615 06/08/17 1619 06/09/17 0357  WBC 13.0*  --  11.9*  HGB 9.8* 10.2* 9.7*  HCT 31.2* 30.0* 30.5*  PLT 146*  --  136*   BMET:  Recent Labs    06/08/17 0351  06/08/17 1619 06/09/17 0357  NA 140  --  137 135  K 3.7  --  4.1 4.1  CL 110  --  104 103  CO2 24  --   --  25  GLUCOSE 122*  --  113* 118*  BUN 12  --  15 14  CREATININE 0.95   < > 1.00 1.03  CALCIUM 7.3*  --   --  7.7*   < > = values in this interval not displayed.    PT/INR:   Recent Labs    06/07/17 1350  LABPROT 16.8*  INR 1.38   ABG    Component Value Date/Time   PHART 7.298 (L) 06/07/2017 2009   HCO3 22.4 06/07/2017 2009   TCO2 25 06/08/2017 1619   ACIDBASEDEF 4.0 (H) 06/07/2017 2009   O2SAT 64.5 06/09/2017 0410   CBG (last 3)  Recent Labs    06/08/17 2003 06/08/17 2319 06/09/17 0750  GLUCAP 117* 122* 117*    Assessment/Plan: S/P Procedure(s) (LRB): CORONARY ARTERY BYPASS GRAFTING (CABG) x three, using left internal mammary artery and right leg greater saphenous vein harvested endoscopically (N/A) TRANSESOPHAGEAL ECHOCARDIOGRAM (TEE) (N/A) Mobilize Diuresis leave chest tube until leak resolved  iv amiodarone for afib   LOS: 2 days    Tharon Aquas Trigt III 06/09/2017

## 2017-06-09 NOTE — Progress Notes (Signed)
Radiologist called this RN to make aware of new 10% right pneumothorax. Dr. Prescott Gum already aware.

## 2017-06-10 ENCOUNTER — Inpatient Hospital Stay (HOSPITAL_COMMUNITY): Payer: Medicare Other

## 2017-06-10 LAB — BASIC METABOLIC PANEL
Anion gap: 9 (ref 5–15)
BUN: 16 mg/dL (ref 6–20)
CO2: 25 mmol/L (ref 22–32)
Calcium: 8 mg/dL — ABNORMAL LOW (ref 8.9–10.3)
Chloride: 103 mmol/L (ref 101–111)
Creatinine, Ser: 1.16 mg/dL (ref 0.61–1.24)
GFR calc Af Amer: >60 mL/min (ref >60)
GFR calc non Af Amer: 60 mL/min — ABNORMAL LOW (ref >60)
Glucose, Bld: 99 mg/dL (ref 65–99)
Potassium: 3.6 mmol/L (ref 3.5–5.1)
Sodium: 137 mmol/L (ref 135–145)

## 2017-06-10 LAB — GLUCOSE, CAPILLARY
Glucose-Capillary: 86 mg/dL (ref 65–99)
Glucose-Capillary: 94 mg/dL (ref 65–99)
Glucose-Capillary: 78 mg/dL (ref 65–99)

## 2017-06-10 LAB — CBC
HCT: 30.8 % — ABNORMAL LOW (ref 39.0–52.0)
Hemoglobin: 9.9 g/dL — ABNORMAL LOW (ref 13.0–17.0)
MCH: 29.9 pg (ref 26.0–34.0)
MCHC: 32.1 g/dL (ref 30.0–36.0)
MCV: 93.1 fL (ref 78.0–100.0)
Platelets: 144 10*3/uL — ABNORMAL LOW (ref 150–400)
RBC: 3.31 MIL/uL — ABNORMAL LOW (ref 4.22–5.81)
RDW: 13 % (ref 11.5–15.5)
WBC: 11.2 10*3/uL — ABNORMAL HIGH (ref 4.0–10.5)

## 2017-06-10 MED ORDER — POTASSIUM CHLORIDE 10 MEQ/50ML IV SOLN
INTRAVENOUS | Status: AC
  Filled 2017-06-10: qty 50

## 2017-06-10 MED ORDER — FUROSEMIDE 10 MG/ML IJ SOLN
40.0000 mg | Freq: Every day | INTRAMUSCULAR | Status: DC
  Filled 2017-06-10: qty 4

## 2017-06-10 MED ORDER — AMIODARONE HCL 200 MG PO TABS
200.0000 mg | ORAL_TABLET | Freq: Every day | ORAL | Status: DC
  Administered 2017-06-10 – 2017-06-13 (×4): 200 mg via ORAL
  Filled 2017-06-10 (×4): qty 1

## 2017-06-10 MED ORDER — POTASSIUM CHLORIDE 10 MEQ/50ML IV SOLN
10.0000 meq | INTRAVENOUS | Status: AC | PRN
  Administered 2017-06-10 (×4): 10 meq via INTRAVENOUS
  Filled 2017-06-10 (×3): qty 50

## 2017-06-10 NOTE — Progress Notes (Signed)
Patient ID: Patrick Nguyen, male   DOB: [birth year], 77 y.o.   MRN: [id number] [facility] DAILY ICU PROGRESS NOTE                   [address]            [place] [address]          [phone]   3 Days Post-Op Procedure(s) (LRB): CORONARY ARTERY BYPASS GRAFTING (CABG) x three, using left internal mammary artery and right leg greater saphenous vein harvested endoscopically (N/A) TRANSESOPHAGEAL ECHOCARDIOGRAM (TEE) (N/A)  Total Length of Stay:  LOS: 3 days   Subjective: Feels better this morning, ambulated around the unit, has converted from atrial fib remains in sinus now  Objective: Vital signs in last 24 hours: Temp:  [97.5 F (36.4 C)-98.5 F (36.9 C)] 98.5 F (36.9 C) (03/01 0753) Pulse Rate:  [53-124] 67 (03/01 0859) Cardiac Rhythm: Atrial fibrillation (02/28 2000) Resp:  [9-21] 9 (03/01 0700) BP: (99-151)/(49-110) 126/59 (03/01 0859) SpO2:  [87 %-99 %] 94 % (03/01 0700) Weight:  [194 lb 14.2 oz (88.4 kg)] 194 lb 14.2 oz (88.4 kg) (03/01 0600)  Filed Weights   06/08/17 0500 06/09/17 0500 06/10/17 0600  Weight: 196 lb 14.4 oz (89.3 kg) 197 lb 14.4 oz (89.8 kg) 194 lb 14.2 oz (88.4 kg)    Weight change: -0.2 oz (-1.367 kg)   Hemodynamic parameters for last 24 hours:    Intake/Output from previous day: 02/28 0701 - 03/01 0700 In: 668.1 [I.V.:618.1; IV Piggyback:50] Out: 2390 [Urine:2300; Chest Tube:90]  Intake/Output this shift: No intake/output data recorded.  Current Meds: Scheduled Meds: . acetaminophen  1,000 mg Oral Q6H   Or  . acetaminophen (TYLENOL) oral liquid 160 mg/5 mL  1,000 mg Per Tube Q6H  . aspirin EC  325 mg Oral Daily   Or  . aspirin  324 mg Per Tube Daily  . bisacodyl  10 mg Oral Daily   Or  . bisacodyl  10 mg Rectal Daily  . Chlorhexidine Gluconate Cloth  6 each Topical Q0600  . cyclobenzaprine  10 mg Oral QHS  . docusate sodium  200 mg Oral Daily  . enoxaparin (LOVENOX) injection  40 mg Subcutaneous Q24H  . [START ON  06/11/2017] furosemide  40 mg Intravenous Daily  . insulin aspart  0-15 Units Subcutaneous TID WC  . insulin aspart  0-5 Units Subcutaneous QHS  . lisinopril  10 mg Oral Daily  . mouth rinse  15 mL Mouth Rinse BID  . metoprolol tartrate  12.5 mg Oral BID   Or  . metoprolol tartrate  12.5 mg Per Tube BID  . pantoprazole  40 mg Oral Daily  . potassium chloride  20 mEq Oral BID  . rosuvastatin  20 mg Oral QPM  . sodium chloride flush  3 mL Intravenous Q12H  . THROMBI-PAD  1 each Topical Once   Continuous Infusions: . sodium chloride Stopped (06/08/17 0900)  . sodium chloride Stopped (06/08/17 0900)  . sodium chloride Stopped (06/08/17 0900)  . lactated ringers Stopped (06/08/17 0800)  . lactated ringers 10 mL/hr at 06/10/17 0600  . nitroGLYCERIN Stopped (06/08/17 1100)  . phenylephrine (NEO-SYNEPHRINE) Adult infusion Stopped (06/07/17 2134)   PRN Meds:.sodium chloride, metoprolol tartrate, midazolam, morphine injection, ondansetron (ZOFRAN) IV, oxyCODONE, simethicone, sodium chloride flush, traMADol  General appearance: alert, cooperative and no distress Neurologic: intact Heart: regular rate and rhythm, S1, S2 normal, no murmur, click, rub or gallop Lungs: diminished breath sounds  bibasilar Abdomen: soft, non-tender; bowel sounds normal; no masses,  no organomegaly Extremities: extremities normal, atraumatic, no cyanosis or edema Wound: Sternum is stable Patient has small intermittent air leak from remaining chest tube, very small minimal right apical pneumothorax on chest x-ray this morning  Lab Results: CBC: Recent Labs    06/09/17 0357 06/09/17 1639 06/10/17 0345  WBC 11.9*  --  11.2*  HGB 9.7* 10.9* 9.9*  HCT 30.5* 32.0* 30.8*  PLT 136*  --  144*   BMET:  Recent Labs    06/09/17 0357 06/09/17 1639 06/10/17 0345  NA 135 136 137  K 4.1 4.1 3.6  CL 103 99* 103  CO2 25  --  25  GLUCOSE 118* 124* 99  BUN 14 17 16   CREATININE 1.03 1.00 1.16  CALCIUM 7.7*  --  8.0*     CMET: Lab Results  Component Value Date   WBC 11.2 (H) 06/10/2017   HGB 9.9 (L) 06/10/2017   HCT 30.8 (L) 06/10/2017   PLT 144 (L) 06/10/2017   GLUCOSE 99 06/10/2017   ALT 24 06/03/2017   AST 32 06/03/2017   NA 137 06/10/2017   K 3.6 06/10/2017   CL 103 06/10/2017   CREATININE 1.16 06/10/2017   BUN 16 06/10/2017   CO2 25 06/10/2017   TSH 2.260 05/16/2017   INR 1.38 06/07/2017   HGBA1C 5.8 (H) 06/03/2017      PT/INR:  Recent Labs    06/07/17 1350  LABPROT 16.8*  INR 1.38   Radiology: Dg Chest Port 1 View  Result Date: 06/10/2017 CLINICAL DATA:  History of CABG EXAM: PORTABLE CHEST 1 VIEW COMPARISON:  Yesterday FINDINGS: Stable normal heart size. Status post CABG. There is a low volume chest with mild atelectasis and small pleural effusions. Unchanged small right apical pneumothorax. IMPRESSION: 1. Stable compared yesterday. 2. Small right apical pneumothorax. 3. Atelectasis and small effusions at the bases. Electronically Signed   By: Monte Fantasia M.D.   On: 06/10/2017 07:42     Assessment/Plan: S/P Procedure(s) (LRB): CORONARY ARTERY BYPASS GRAFTING (CABG) x three, using left internal mammary artery and right leg greater saphenous vein harvested endoscopically (N/A) TRANSESOPHAGEAL ECHOCARDIOGRAM (TEE) (N/A) Mobilize Diuresis Convert to p.o. Amiodarone Chest tube to waterseal but with small air leak will leave tube in place today DC Foley Follow-up chest x-ray in a.m. Decrease Lasix from 40 mg IV twice daily to once a day  Grace Isaac 06/10/2017 9:03 AM

## 2017-06-10 NOTE — Plan of Care (Signed)
  Progressing Education: Ability to demonstrate proper wound care will improve 06/10/2017 1214 - Progressing by Vickey Sages, RN Knowledge of disease or condition will improve 06/10/2017 1214 - Progressing by Vickey Sages, RN Knowledge of the prescribed therapeutic regimen will improve 06/10/2017 1214 - Progressing by Vickey Sages, RN Activity: Risk for activity intolerance will decrease 06/10/2017 1214 - Progressing by Vickey Sages, RN Cardiac: Hemodynamic stability will improve 06/10/2017 1214 - Progressing by Vickey Sages, RN Clinical Measurements: Postoperative complications will be avoided or minimized 06/10/2017 1214 - Progressing by Vickey Sages, RN Respiratory: Respiratory status will improve 06/10/2017 1214 - Progressing by Vickey Sages, RN Skin Integrity: Wound healing without signs and symptoms of infection 06/10/2017 1214 - Progressing by Vickey Sages, RN Risk for impaired skin integrity will decrease 06/10/2017 1214 - Progressing by Vickey Sages, RN Urinary Elimination: Ability to achieve and maintain adequate renal perfusion and functioning will improve 06/10/2017 1214 - Progressing by Vickey Sages, RN Education: Knowledge of General Education information will improve 06/10/2017 1214 - Progressing by Vickey Sages, [place] Behavior/Discharge Planning: Ability to manage health-related needs will improve 06/10/2017 1214 - Progressing by Vickey Sages, RN Clinical Measurements: Ability to maintain clinical measurements within normal limits will improve 06/10/2017 1214 - Progressing by Vickey Sages, RN Will remain free from infection 06/10/2017 1214 - Progressing by Vickey Sages, RN Diagnostic test results will improve 06/10/2017 1214 - Progressing by Vickey Sages, RN Respiratory complications will improve 06/10/2017 1214 - Progressing by Vickey Sages,  RN Cardiovascular complication will be avoided 06/10/2017 1214 - Progressing by Vickey Sages, RN Activity: Risk for activity intolerance will decrease 06/10/2017 1214 - Progressing by Vickey Sages, RN Nutrition: Adequate nutrition will be maintained 06/10/2017 1214 - Progressing by Vickey Sages, RN Coping: Level of anxiety will decrease 06/10/2017 1214 - Progressing by Vickey Sages, RN Elimination: Will not experience complications related to bowel motility 06/10/2017 1214 - Progressing by Vickey Sages, RN Will not experience complications related to urinary retention 06/10/2017 1214 - Progressing by Vickey Sages, RN Pain Managment: General experience of comfort will improve 06/10/2017 1214 - Progressing by Vickey Sages, RN Safety: Ability to remain free from injury will improve 06/10/2017 1214 - Progressing by Vickey Sages, RN Skin Integrity: Risk for impaired skin integrity will decrease 06/10/2017 1214 - Progressing by Vickey Sages, RN

## 2017-06-10 NOTE — Progress Notes (Signed)
      [address]       [place] [address]             [phone]      POD # 3  Stable day BP 135/68 (BP Location: Left Arm)   Pulse 64   Temp 98.2 F (36.8 C) (Oral)   Resp (!) 23   Ht 5\' 10"  (1.778 m)   Wt 194 lb 14.2 oz (88.4 kg)   SpO2 92%   BMI 27.96 kg/m   Intake/Output Summary (Last 24 hours) at 06/10/2017 1800 Last data filed at 06/10/2017 1400 Gross per 24 hour  Intake 1240.5 ml  Output 1230 ml  Net 10.5 ml   Continue current care  [place] Gavitt C. Roxan Hockey, MD [facility] [phone]

## 2017-06-10 NOTE — Discharge Summary (Signed)
Physician Discharge Summary       [address]       [place] [address]             [phone]    Patient ID: Patrick Nguyen MRN: [id number] DOB/AGE: 75-Feb-1942 75 y.o.  Admit date: 06/07/2017 Discharge date: 06/13/2017  Admission Diagnoses: Coronary artery disease  Discharge Diagnoses:  1.  S/P CABG x 3 2. ABL anemia 3. Post op a fib 4. History of hyperlipidemia 5. History of remote tobacco abuse  Procedure (s):  1. Coronary artery bypass grafting x3 (left internal mammary artery to left anterior descending, saphenous vein graft to obtuse marginal, saphenous vein graft to posterior descending). 2. Endoscopic harvest of right leg greater saphenous vein by Dr. Prescott Gum on 06/07/2017.   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 Prior to his move he had developed symptoms of angina and arrhythmias. He was recommended to see a cardiologist at the time of his arrival. The move was difficult for him with increased symptoms of chest pain shortness of breath. He recently had a terrible night with shortness of breath and chest tightness which scared his wife.  The patient was seen by Dr. [facility] and prepared for cardiac catheterization by Dr. [name]. This demonstrated severe three-vessel coronary disease with chronic occlusion of the RCA which filled via collaterals and a hemodynamically significant moderate 70% stenosis of the LAD. The cervix marginal had an 85-90% stenosis. LV EF was normal. LVEDP was 15. CABG was recommended.  Echocardiogram shows no significant valvular disease.  Dr. Prescott Gum discussed the need for coronary artery bypass grafting surgery. Potential risks, benefits, and complications were discussed with the patient and he agreed to proceed with surgery. Pre operative carotid duplex US showed no significant internal carotid artery stenosis  bilaterally. He underwent a CABG x 3 on 06/07/2017.   Brief Hospital Course:  The patient was extubated the evening of surgery without difficulty. He remained afebrile and hemodynamically stable. He was weaned off Milrinone drip. Gordy Councilman, and a line were removed early in the post operative course. Foley was removed on 03/01. Mediastinal chest tube had small air leak so it remained for a few days post op and then was removed. Lopressor was started and titrated accordingly. He went into a fib and was put on Amiodarone. He converted to sinus rhythm. He/she was volume over loaded and diuresed. He had ABL anemia. He did not require a post op transfusion. Last H and H was stable at 10 and 30.8. He was weaned off the insulin drip.  The patient's glucose remained well controlled.The patient's HGA1C pre op was 5.8. He is likely pre diabetic. He will need further surveillance of his HGA1C with his medical doctor after discharge. Diet information has been included in his discharge paperwork. The patient was felt surgically stable for transfer from the ICU to PCTU for further convalescence on 06/11/2017. He continues to progress with cardiac rehab. His Lopressor was increased to 50 mg bid on 03/03 as he was having some intermittent a fib.He was ambulating on room air. He has been tolerating a diet and has had a bowel movement. Epicardial pacing wires were removed on 06/13/2017. He was maintaining sinus rhythm in the 60's on 03/04. As a result, his Lopressor was decreased to 25 mg bid. Chest tube sutures will be removed the day of discharge. The patient is  felt surgically stable for discharge today.   Latest Vital Signs: Blood pressure (!) 148/71, pulse (!) 105, temperature 98.6 F (37 C), temperature source Oral, resp. rate 18, height 5\' 10"  (1.778 m), weight 185 lb 6.4 oz (84.1 kg), SpO2 94 %.  Physical Exam: Cardiovascular: RRR Pulmonary: Slightly diminished at bases Abdomen: Soft, non tender, bowel sounds  present. Extremities: Nol lower extremity edema. Wounds: Clean and dry.  No erythema or signs of infection.   Discharge Condition: Stable and discharged to home.  Recent laboratory studies:  Lab Results  Component Value Date   WBC 7.6 06/12/2017   HGB 10.1 (L) 06/12/2017   HCT 30.9 (L) 06/12/2017   MCV 92.5 06/12/2017   PLT 221 06/12/2017   Lab Results  Component Value Date   NA 139 06/13/2017   K 3.4 (L) 06/13/2017   CL 104 06/13/2017   CO2 25 06/13/2017   CREATININE 0.92 06/13/2017   GLUCOSE 98 06/13/2017    Diagnostic Studies: Dg Chest 2 View  Result Date: 06/12/2017 CLINICAL DATA:  Status post CABG EXAM: CHEST  2 VIEW COMPARISON:  06/11/2017 FINDINGS: Small bilateral pleural effusions with associated bibasilar scarring/atelectasis. No focal consolidation. No pneumothorax. The heart is normal in size. Postsurgical changes related to prior CABG. Degenerative changes of the visualized thoracolumbar spine. Median sternotomy. IMPRESSION: Small bilateral pleural effusions with associated bibasilar scarring/atelectasis. No pneumothorax. Electronically Signed   By: Julian Hy M.D.   On: 06/12/2017 07:57    Discharge Medications: Allergies as of 06/13/2017      Reactions   Atorvastatin Other (See Comments)   Made pt not like himself      Medication List    TAKE these medications   amiodarone 200 MG tablet Commonly known as:  PACERONE Take 1 tablet (200 mg total) by mouth daily.   aspirin 325 MG EC tablet Take 1 tablet (325 mg total) by mouth daily. What changed:    medication strength  how much to take   CRESTOR 20 MG tablet Generic drug:  rosuvastatin Take 20 mg by mouth every evening.   lisinopril 10 MG tablet Commonly known as:  PRINIVIL,ZESTRIL Take 1 tablet (10 mg total) by mouth daily.   metoprolol tartrate 25 MG tablet Commonly known as:  LOPRESSOR Take 1 tablet (25 mg total) by mouth 2 (two) times daily.   traMADol 50 MG tablet Commonly known  as:  ULTRAM Take 50 mg by mouth every 4-6 hours PRN severe pain.     The patient has been discharged on:   1.Beta Blocker:  Yes [  x ]                              No   [   ]                              If No, reason:  2.Ace Inhibitor/ARB: Yes [ x  ]                                     No  [    ]                                     If  No, reason:  3.Statin:   Yes [ x  ]                  No  [   ]                  If No, reason:  4.Ecasa:  Yes  [ x  ]                  No   [   ]                  If No, reason:  Follow Up Appointments: Follow-up Information    Ivin Poot, MD Follow up on 07/13/2017.   Specialty:  Cardiothoracic Surgery Why:  PA/LAT CXR to be taken (at [facility] which is in the same building as Dr. Prescott Gum' office) on 07/13/2017 at 11:30 am;Appointment time is at 12:00 pm Contact information: [place] [address] [phone]        [name], MD Follow up.   Specialty:  Internal Medicine Why:  Call for a follow up appointment regarding further surveillance of HGA1C 5.8. Patient has pre diabetes Contact information: [address] [place] [address] [phone]        Barrett, Evelene Croon, PA-C. Go on 06/28/2017.   Specialties:  Cardiology, Radiology Why:  Appointment time is at11:30 am  Contact information: [address] [place] [address] [phone]           Signed: Sharalyn Ink [facility] 06/13/2017, 8:17 AM

## 2017-06-10 NOTE — Discharge Instructions (Signed)
Coronary Artery Bypass Grafting, Care After This sheet gives you information about how to care for yourself after your procedure. Your health care provider may also give you more specific instructions. If you have problems or questions, contact your health care provider. What can I expect after the procedure? After the procedure, it is common to have:  Nausea and a lack of appetite.  Constipation.  Weakness and fatigue.  Depression or irritability.  Pain or discomfort in your incision areas.  Follow these instructions at home: Medicines  Take over-the-counter and prescription medicines only as told by your health care provider. Do not stop taking medicines or start any new medicines without approval from your health care provider.  If you were prescribed an antibiotic medicine, take it as told by your health care provider. Do not stop taking the antibiotic even if you start to feel better.  Do not drive or use heavy machinery while taking prescription pain medicine. Incision care  Follow instructions from your health care provider about how to take care of your incisions. Make sure you: ? Wash your hands with soap and water before you change your bandage (dressing). If soap and water are not available, use hand sanitizer. ? Change your dressing as told by your health care provider. ? Leave stitches (sutures), skin glue, or adhesive strips in place. These skin closures may need to stay in place for 2 weeks or longer. If adhesive strip edges start to loosen and curl up, you may trim the loose edges. Do not remove adhesive strips completely unless your health care provider tells you to do that.  Keep incision areas clean, dry, and protected.  Check your incision areas every day for signs of infection. Check for: ? More redness, swelling, or pain. ? More fluid or blood. ? Warmth. ? Pus or a bad smell.  If incisions were made in your legs: ? Avoid crossing your legs. ? Avoid  sitting for long periods of time. Change positions every 30 minutes. ? Raise (elevate) your legs when you are sitting. Bathing  Do not take baths, swim, or use a hot tub until your health care provider approves.  Only take sponge baths. Pat the incisions dry. Do not rub incisions with a washcloth or towel.  Ask your health care provider when you can shower. Eating and drinking  Eat foods that are high in fiber, such as raw fruits and vegetables, whole grains, beans, and nuts. Meats should be lean cut. Avoid canned, processed, and fried foods. This can help prevent constipation and is a recommended part of a heart-healthy diet.  Drink enough fluid to keep your urine clear or pale yellow.  Limit alcohol intake to no more than 1 drink a day for nonpregnant women and 2 drinks a day for men. One drink equals 12 oz of beer, 5 oz of wine, or 1 oz of hard liquor. Activity  Rest and limit your activity as told by your health care provider. You may be instructed to: ? Stop any activity right away if you have chest pain, shortness of breath, irregular heartbeats, or dizziness. Get help right away if you have any of these symptoms. ? Move around frequently for short periods or take short walks as directed by your health care provider. Gradually increase your activities. You may need physical therapy or cardiac rehabilitation to help strengthen your muscles and build your endurance. ? Avoid lifting, pushing, or pulling anything that is heavier than 10 lb (4.5 kg) for at  least 6 weeks or as told by your health care provider.  Do not drive until your health care provider approves.  Ask your health care provider when you may return to work.  Ask your health care provider when you may resume sexual activity. General instructions  Do not use any products that contain nicotine or tobacco, such as cigarettes and e-cigarettes. If you need help quitting, ask your health care provider.  Take 2-3 deep  breaths every few hours during the day, while you recover. This helps expand your lungs and prevent complications like pneumonia after surgery.  If you were given a device called an incentive spirometer, use it several times a day to practice deep breathing. Support your chest with a pillow or your arms when you take deep breaths or cough.  Wear compression stockings as told by your health care provider. These stockings help to prevent blood clots and reduce swelling in your legs.  Weigh yourself every day. This helps identify if your body is holding (retaining) fluid that may make your heart and lungs work harder.  Keep all follow-up visits as told by your health care provider. This is important. Contact a health care provider if:  You have more redness, swelling, or pain around any incision.  You have more fluid or blood coming from any incision.  Any incision feels warm to the touch.  You have pus or a bad smell coming from any incision  You have a fever.  You have swelling in your ankles or legs.  You have pain in your legs.  You gain 2 lb (0.9 kg) or more a day.  You are nauseous or you vomit.  You have diarrhea. Get help right away if:  You have chest pain that spreads to your jaw or arms.  You are short of breath.  You have a fast or irregular heartbeat.  You notice a "clicking" in your breastbone (sternum) when you move.  You have numbness or weakness in your arms or legs.  You feel dizzy or light-headed. Summary  After the procedure, it is common to have pain or discomfort in the incision areas.  Do not take baths, swim, or use a hot tub until your health care provider approves.  Gradually increase your activities. You may need physical therapy or cardiac rehabilitation to help strengthen your muscles and build your endurance.  Weigh yourself every day. This helps identify if your body is holding (retaining) fluid that may make your heart and lungs work  harder. This information is not intended to replace advice given to you by your health care provider. Make sure you discuss any questions you have with your health care provider. Document Released: 10/16/2004 Document Revised: 02/16/2016 Document Reviewed: 02/16/2016 Elsevier Interactive Patient Education  2018 [employer].  Prediabetes Eating Plan Prediabetes--also called impaired glucose tolerance or impaired fasting glucose--is a condition that causes blood sugar (blood glucose) levels to be higher than normal. Following a healthy diet can help to keep prediabetes under control. It can also help to lower the risk of type 2 diabetes and heart disease, which are increased in people who have prediabetes. Along with regular exercise, a healthy diet:  Promotes weight loss.  Helps to control blood sugar levels.  Helps to improve the way that the body uses insulin.  What do I need to know about this eating plan?  Use the glycemic index (GI) to plan your meals. The index tells you how quickly a food will raise your  blood sugar. Choose low-GI foods. These foods take a longer time to raise blood sugar.  Pay close attention to the amount of carbohydrates in the food that you eat. Carbohydrates increase blood sugar levels.  Keep track of how many calories you take in. Eating the right amount of calories will help you to achieve a healthy weight. Losing about 7 percent of your starting weight can help to prevent type 2 diabetes.  You may want to follow a Mediterranean diet. This diet includes a lot of vegetables, lean meats or fish, whole grains, fruits, and healthy oils and fats. What foods can I eat? Grains Whole grains, such as whole-wheat or whole-grain breads, crackers, cereals, and pasta. Unsweetened oatmeal. Bulgur. Barley. Quinoa. Brown rice. Corn or whole-wheat flour tortillas or taco shells. Vegetables Lettuce. Spinach. Peas. Beets. Cauliflower. Cabbage. Broccoli. Carrots. Tomatoes.  Squash. Eggplant. Herbs. Peppers. Onions. Cucumbers. Brussels sprouts. Fruits Berries. Bananas. Apples. Oranges. Grapes. Papaya. Mango. Pomegranate. Kiwi. Grapefruit. Cherries. Meats and Other Protein Sources Seafood. Lean meats, such as chicken and [place] or lean cuts of pork and beef. Tofu. Eggs. Nuts. Beans. Dairy Low-fat or fat-free dairy products, such as yogurt, cottage cheese, and cheese. Beverages Water. Tea. Coffee. Sugar-free or diet soda. Seltzer water. Milk. Milk alternatives, such as soy or almond milk. Condiments Mustard. Relish. Low-fat, low-sugar ketchup. Low-fat, low-sugar barbecue sauce. Low-fat or fat-free mayonnaise. Sweets and Desserts Sugar-free or low-fat pudding. Sugar-free or low-fat ice cream and other frozen treats. Fats and Oils Avocado. Walnuts. Olive oil. The items listed above may not be a complete list of recommended foods or beverages. Contact your dietitian for more options. What foods are not recommended? Grains Refined white flour and flour products, such as bread, pasta, snack foods, and cereals. Beverages Sweetened drinks, such as sweet iced tea and soda. Sweets and Desserts Baked goods, such as cake, cupcakes, pastries, cookies, and cheesecake. The items listed above may not be a complete list of foods and beverages to avoid. Contact your dietitian for more information. This information is not intended to replace advice given to you by your health care provider. Make sure you discuss any questions you have with your health care provider. Document Released: 08/13/2014 Document Revised: 09/04/2015 Document Reviewed: 04/24/2014 Elsevier Interactive Patient Education  2017 [employer].

## 2017-06-11 ENCOUNTER — Inpatient Hospital Stay (HOSPITAL_COMMUNITY): Payer: Medicare Other

## 2017-06-11 LAB — CBC
HCT: 30.8 % — ABNORMAL LOW (ref 39.0–52.0)
Hemoglobin: 10 g/dL — ABNORMAL LOW (ref 13.0–17.0)
MCH: 30.3 pg (ref 26.0–34.0)
MCHC: 32.5 g/dL (ref 30.0–36.0)
MCV: 93.3 fL (ref 78.0–100.0)
Platelets: 186 10*3/uL (ref 150–400)
RBC: 3.3 MIL/uL — ABNORMAL LOW (ref 4.22–5.81)
RDW: 13.5 % (ref 11.5–15.5)
WBC: 7.9 10*3/uL (ref 4.0–10.5)

## 2017-06-11 LAB — BASIC METABOLIC PANEL
Anion gap: 9 (ref 5–15)
BUN: 16 mg/dL (ref 6–20)
CO2: 24 mmol/L (ref 22–32)
Calcium: 8.2 mg/dL — ABNORMAL LOW (ref 8.9–10.3)
Chloride: 106 mmol/L (ref 101–111)
Creatinine, Ser: 0.92 mg/dL (ref 0.61–1.24)
GFR calc Af Amer: >60 mL/min (ref >60)
GFR calc non Af Amer: >60 mL/min (ref >60)
Glucose, Bld: 95 mg/dL (ref 65–99)
Potassium: 3.6 mmol/L (ref 3.5–5.1)
Sodium: 139 mmol/L (ref 135–145)

## 2017-06-11 LAB — GLUCOSE, CAPILLARY
Glucose-Capillary: 83 mg/dL (ref 65–99)
Glucose-Capillary: 102 mg/dL — ABNORMAL HIGH (ref 65–99)

## 2017-06-11 MED ORDER — SODIUM CHLORIDE 0.9% FLUSH
3.0000 mL | Freq: Two times a day (BID) | INTRAVENOUS | Status: DC
  Administered 2017-06-12 – 2017-06-13 (×3): 3 mL via INTRAVENOUS

## 2017-06-11 MED ORDER — ALUM & MAG HYDROXIDE-SIMETH 200-200-20 MG/5ML PO SUSP: 15.0000 mL | Freq: Four times a day (QID) | ORAL | Status: DC | PRN

## 2017-06-11 MED ORDER — SODIUM CHLORIDE 0.9 % IV SOLN: 250.0000 mL | INTRAVENOUS | Status: DC | PRN

## 2017-06-11 MED ORDER — SODIUM CHLORIDE 0.9% FLUSH: 3.0000 mL | INTRAVENOUS | Status: DC | PRN

## 2017-06-11 MED ORDER — MAGNESIUM HYDROXIDE 400 MG/5ML PO SUSP: 30.0000 mL | Freq: Every day | ORAL | Status: DC | PRN

## 2017-06-11 MED ORDER — POTASSIUM CHLORIDE CRYS ER 20 MEQ PO TBCR
40.0000 meq | EXTENDED_RELEASE_TABLET | Freq: Two times a day (BID) | ORAL | Status: DC
  Administered 2017-06-11 – 2017-06-13 (×5): 40 meq via ORAL
  Filled 2017-06-11 (×4): qty 2

## 2017-06-11 MED ORDER — FUROSEMIDE 40 MG PO TABS
40.0000 mg | ORAL_TABLET | Freq: Every day | ORAL | Status: DC
  Administered 2017-06-11 – 2017-06-13 (×3): 40 mg via ORAL
  Filled 2017-06-11 (×3): qty 1

## 2017-06-11 MED ORDER — METOPROLOL TARTRATE 25 MG PO TABS
25.0000 mg | ORAL_TABLET | Freq: Two times a day (BID) | ORAL | Status: DC
  Administered 2017-06-11 – 2017-06-12 (×3): 25 mg via ORAL
  Filled 2017-06-11 (×3): qty 1

## 2017-06-11 MED ORDER — ZOLPIDEM TARTRATE 5 MG PO TABS: 5.0000 mg | ORAL_TABLET | Freq: Every evening | ORAL | Status: DC | PRN

## 2017-06-11 MED ORDER — MOVING RIGHT ALONG BOOK
Freq: Once | Status: AC
  Administered 2017-06-11: 16:00:00
  Filled 2017-06-11: qty 1

## 2017-06-11 NOTE — Progress Notes (Signed)
Mr. Smoak had approximately a five minute run of multiple arrhythmias, PVC's, 5 beats of V tack, A flutter, and a heart rate of 140.  EKG was obtained and showed SR.  VSS b/p 139/75.  Patient denied any chest pain or sob.  Paged Evonnie Pat, PA, orders received for am labs, mag level and bmet.

## 2017-06-11 NOTE — Progress Notes (Signed)
4 Days Post-Op Procedure(s) (LRB): CORONARY ARTERY BYPASS GRAFTING (CABG) x three, using left internal mammary artery and right leg greater saphenous vein harvested endoscopically (N/A) TRANSESOPHAGEAL ECHOCARDIOGRAM (TEE) (N/A) Subjective: No complaints this AM  Objective: Vital signs in last 24 hours: Temp:  [97.3 F (36.3 C)-98.4 F (36.9 C)] 97.4 F (36.3 C) (03/02 0700) Pulse Rate:  [53-129] 57 (03/02 0800) Cardiac Rhythm: Normal sinus rhythm (03/02 0800) Resp:  [14-27] 17 (03/02 0800) BP: (88-146)/(53-77) 136/64 (03/02 0800) SpO2:  [91 %-100 %] 98 % (03/02 0800) Weight:  [190 lb 9.6 oz (86.5 kg)] 190 lb 9.6 oz (86.5 kg) (03/02 0500)  Hemodynamic parameters for last 24 hours:    Intake/Output from previous day: 03/01 0701 - 03/02 0700 In: 1093.4 [P.O.:840; I.V.:103.4; IV Piggyback:150] Out: 240 [Urine:150; Chest Tube:90] Intake/Output this shift: No intake/output data recorded.  General appearance: alert, cooperative and no distress Neurologic: intact Heart: regular rate and rhythm Lungs: clear to auscultation bilaterally Abdomen: normal findings: soft, non-tender no air leak  Lab Results: Recent Labs    06/10/17 0345 06/11/17 0825  WBC 11.2* 7.9  HGB 9.9* 10.0*  HCT 30.8* 30.8*  PLT 144* 186   BMET:  Recent Labs    06/10/17 0345 06/11/17 0825  NA 137 139  K 3.6 3.6  CL 103 106  CO2 25 24  GLUCOSE 99 95  BUN 16 16  CREATININE 1.16 0.92  CALCIUM 8.0* 8.2*    PT/INR: No results for input(s): LABPROT, INR in the last 72 hours. ABG    Component Value Date/Time   PHART 7.298 (L) 06/07/2017 2009   HCO3 22.4 06/07/2017 2009   TCO2 26 06/09/2017 1639   ACIDBASEDEF 4.0 (H) 06/07/2017 2009   O2SAT 64.5 06/09/2017 0410   CBG (last 3)  Recent Labs    06/10/17 1228 06/10/17 1756 06/11/17 0811  GLUCAP 94 78 83    Assessment/Plan: S/P Procedure(s) (LRB): CORONARY ARTERY BYPASS GRAFTING (CABG) x three, using left internal mammary artery and right  leg greater saphenous vein harvested endoscopically (N/A) TRANSESOPHAGEAL ECHOCARDIOGRAM (TEE) (N/A) Plan for transfer to step-down: see transfer orders  Doing well this AM Atrial fib overnight- back in SR this AM  Increase metoprolol, continue amiodarone No air leak and no significant pneumothorax- dc chest tube Supplement K, change lasix to PO   LOS: 4 days    Melrose Nakayama 06/11/2017

## 2017-06-11 NOTE — Plan of Care (Signed)
  Progressing Education: Ability to demonstrate proper wound care will improve 06/11/2017 0515 - Progressing by Beryle Quant, RN Knowledge of disease or condition will improve 06/11/2017 0515 - Progressing by Beryle Quant, RN Knowledge of the prescribed therapeutic regimen will improve 06/11/2017 0515 - Progressing by Beryle Quant, RN Activity: Risk for activity intolerance will decrease 06/11/2017 0515 - Progressing by Beryle Quant, RN Cardiac: Hemodynamic stability will improve 06/11/2017 0515 - Progressing by Beryle Quant, RN Clinical Measurements: Postoperative complications will be avoided or minimized 06/11/2017 0515 - Progressing by Beryle Quant, RN Respiratory: Respiratory status will improve 06/11/2017 0515 - Progressing by Beryle Quant, RN Skin Integrity: Wound healing without signs and symptoms of infection 06/11/2017 0515 - Progressing by Beryle Quant, RN Risk for impaired skin integrity will decrease 06/11/2017 0515 - Progressing by Beryle Quant, RN Urinary Elimination: Ability to achieve and maintain adequate renal perfusion and functioning will improve 06/11/2017 0515 - Progressing by Beryle Quant, RN Education: Knowledge of General Education information will improve 06/11/2017 0515 - Progressing by Beryle Quant, RN Health Behavior/Discharge Planning: Ability to manage health-related needs will improve 06/11/2017 0515 - Progressing by Beryle Quant, RN Clinical Measurements: Ability to maintain clinical measurements within normal limits will improve 06/11/2017 0515 - Progressing by Beryle Quant, RN Will remain free from infection 06/11/2017 0515 - Progressing by Beryle Quant, RN Diagnostic test results will improve 06/11/2017 0515 - Progressing by Beryle Quant, RN Respiratory complications will improve 06/11/2017 0515 - Progressing by Beryle Quant, RN Cardiovascular complication will be  avoided 06/11/2017 0515 - Progressing by Beryle Quant, RN Activity: Risk for activity intolerance will decrease 06/11/2017 0515 - Progressing by Beryle Quant, RN Nutrition: Adequate nutrition will be maintained 06/11/2017 0515 - Progressing by Beryle Quant, RN Coping: Level of anxiety will decrease 06/11/2017 0515 - Progressing by Beryle Quant, RN Elimination: Will not experience complications related to bowel motility 06/11/2017 0515 - Progressing by Beryle Quant, RN Will not experience complications related to urinary retention 06/11/2017 0515 - Progressing by Beryle Quant, RN Pain Managment: General experience of comfort will improve 06/11/2017 0515 - Progressing by Beryle Quant, RN Safety: Ability to remain free from injury will improve 06/11/2017 0515 - Progressing by Beryle Quant, RN Skin Integrity: Risk for impaired skin integrity will decrease 06/11/2017 0515 - Progressing by Beryle Quant, RN

## 2017-06-12 ENCOUNTER — Inpatient Hospital Stay (HOSPITAL_COMMUNITY): Payer: Medicare Other

## 2017-06-12 LAB — BASIC METABOLIC PANEL
Anion gap: 10 (ref 5–15)
BUN: 16 mg/dL (ref 6–20)
CHLORIDE: 103 mmol/L (ref 101–111)
CO2: 24 mmol/L (ref 22–32)
Calcium: 8.2 mg/dL — ABNORMAL LOW (ref 8.9–10.3)
Creatinine, Ser: 0.94 mg/dL (ref 0.61–1.24)
GFR calc Af Amer: >60 mL/min (ref >60)
GFR calc non Af Amer: >60 mL/min (ref >60)
GLUCOSE: 83 mg/dL (ref 65–99)
POTASSIUM: 3.4 mmol/L — AB (ref 3.5–5.1)
Sodium: 137 mmol/L (ref 135–145)

## 2017-06-12 LAB — CBC
HCT: 30.9 % — ABNORMAL LOW (ref 39.0–52.0)
HEMOGLOBIN: 10.1 g/dL — AB (ref 13.0–17.0)
MCH: 30.2 pg (ref 26.0–34.0)
MCHC: 32.7 g/dL (ref 30.0–36.0)
MCV: 92.5 fL (ref 78.0–100.0)
Platelets: 221 10*3/uL (ref 150–400)
RBC: 3.34 MIL/uL — AB (ref 4.22–5.81)
RDW: 13.3 % (ref 11.5–15.5)
WBC: 7.6 10*3/uL (ref 4.0–10.5)

## 2017-06-12 LAB — MAGNESIUM: Magnesium: 2 mg/dL (ref 1.7–2.4)

## 2017-06-12 MED ORDER — METOPROLOL TARTRATE 50 MG PO TABS
50.0000 mg | ORAL_TABLET | Freq: Two times a day (BID) | ORAL | Status: DC
  Administered 2017-06-12: 50 mg via ORAL
  Filled 2017-06-12: qty 1

## 2017-06-12 MED ORDER — MAGNESIUM OXIDE 400 (241.3 MG) MG PO TABS
400.0000 mg | ORAL_TABLET | Freq: Two times a day (BID) | ORAL | Status: DC
  Administered 2017-06-12 – 2017-06-13 (×3): 400 mg via ORAL
  Filled 2017-06-12 (×3): qty 1

## 2017-06-12 NOTE — Plan of Care (Signed)
Progressing

## 2017-06-12 NOTE — Progress Notes (Signed)
Patrick Nguyen request to sit on the side of the bed to take his daily medications, and to have his Potassium tablet given with apple sauce.

## 2017-06-12 NOTE — Progress Notes (Addendum)
[address]       [place], [address]             [phone]      5 Days Post-Op Procedure(s) (LRB): CORONARY ARTERY BYPASS GRAFTING (CABG) x three, using left internal mammary artery and right leg greater saphenous vein harvested endoscopically (N/A) TRANSESOPHAGEAL ECHOCARDIOGRAM (TEE) (N/A) Subjective: Feels well Some afib episodes and PVC's, mostly sinus rhythm  Objective: Vital signs in last 24 hours: Temp:  [98 F (36.7 C)-98.8 F (37.1 C)] 98 F (36.7 C) (03/03 0426) Pulse Rate:  [56-78] 78 (03/03 0636) Cardiac Rhythm: Normal sinus rhythm (03/03 0700) Resp:  [15-21] 18 (03/03 0636) BP: (108-139)/(62-89) 121/89 (03/03 0426) SpO2:  [92 %-98 %] 94 % (03/03 0636) Weight:  [187 lb 12.8 oz (85.2 kg)] 187 lb 12.8 oz (85.2 kg) (03/03 0636)  Hemodynamic parameters for last 24 hours:    Intake/Output from previous day: 03/02 0701 - 03/03 0700 In: 360 [P.O.:360] Out: 250 [Urine:250] Intake/Output this shift: No intake/output data recorded.  General appearance: alert, cooperative and no distress Heart: regular rate and rhythm Lungs: min dim in bases Abdomen: benign Extremities: min edema Wound: incis healing well  Lab Results: Recent Labs    06/11/17 0825 06/12/17 0410  WBC 7.9 7.6  HGB 10.0* 10.1*  HCT 30.8* 30.9*  PLT 186 221   BMET:  Recent Labs    06/11/17 0825 06/12/17 0410  NA 139 137  K 3.6 3.4*  CL 106 103  CO2 24 24  GLUCOSE 95 83  BUN 16 16  CREATININE 0.92 0.94  CALCIUM 8.2* 8.2*    PT/INR: No results for input(s): LABPROT, INR in the last 72 hours. ABG    Component Value Date/Time   PHART 7.298 (L) 06/07/2017 2009   HCO3 22.4 06/07/2017 2009   TCO2 26 06/09/2017 1639   ACIDBASEDEF 4.0 (H) 06/07/2017 2009   O2SAT 64.5 06/09/2017 0410   CBG (last 3)  Recent Labs    06/10/17 1756 06/11/17 0811 06/11/17 1600  GLUCAP 78 83 102*    Meds Scheduled Meds: . acetaminophen  1,000 mg Oral Q6H   Or  .  acetaminophen (TYLENOL) oral liquid 160 mg/5 mL  1,000 mg Per Tube Q6H  . amiodarone  200 mg Oral Daily  . aspirin EC  325 mg Oral Daily  . bisacodyl  10 mg Oral Daily   Or  . bisacodyl  10 mg Rectal Daily  . cyclobenzaprine  10 mg Oral QHS  . docusate sodium  200 mg Oral Daily  . enoxaparin (LOVENOX) injection  40 mg Subcutaneous Q24H  . furosemide  40 mg Oral Daily  . lisinopril  10 mg Oral Daily  . mouth rinse  15 mL Mouth Rinse BID  . metoprolol tartrate  25 mg Oral BID  . pantoprazole  40 mg Oral Daily  . potassium chloride  40 mEq Oral BID  . rosuvastatin  20 mg Oral QPM  . sodium chloride flush  3 mL Intravenous Q12H   Continuous Infusions: . sodium chloride     PRN Meds:.sodium chloride, alum & mag hydroxide-simeth, magnesium hydroxide, metoprolol tartrate, ondansetron (ZOFRAN) IV, oxyCODONE, simethicone, sodium chloride flush, traMADol, zolpidem  Xrays Dg Chest Port 1 View  Result Date: 06/11/2017 CLINICAL DATA:  Follow-up pneumothorax. EXAM: PORTABLE CHEST 1 VIEW COMPARISON:  June 10, 2017 FINDINGS: The previously identified right apical pneumothorax is less conspicuous in the interval measuring 3.5 mm. The sheath from a  right central line is stable. No left-sided pneumothorax. Small bilateral layering effusions with underlying atelectasis. No other changes. IMPRESSION: 1. Persistence of a 3.5 mm right apical pneumothorax, smaller in the interval. 2. Small bilateral layering effusions with underlying opacities/atelectasis. Electronically Signed   By: Dorise Bullion III M.D   On: 06/11/2017 07:33    Assessment/Plan: S/P Procedure(s) (LRB): CORONARY ARTERY BYPASS GRAFTING (CABG) x three, using left internal mammary artery and right leg greater saphenous vein harvested endoscopically (N/A) TRANSESOPHAGEAL ECHOCARDIOGRAM (TEE) (N/A)  1 overall doing very well 2 afib- currently sinus , some pvc's- on amio 200 qday, metoprolol25 bid K= 3.4, replace, Mg++ 2.0 - prob would  benefit from some replacement. CHA2DS2-VASc- +4 so may need to consider further ACRx 3 H/H very stable, platelet ct in normal range 4 cont gentle diuresis 5 routine pulm toilet and cardiac rehab 6 BP control is good on current rx 7 A1c is 5.8, CBG control is good- diet control for now  LOS: 5 days    John Giovanni 06/12/2017 Patient seen and examined, agree with above Overall looks good Still having some intermittent AF- will increase lopressor dose Supplement K Possibly home in next day or two  Remo Lipps C. Roxan Hockey, MD [facility] [phone]

## 2017-06-13 ENCOUNTER — Other Ambulatory Visit: Payer: Self-pay | Admitting: Cardiothoracic Surgery

## 2017-06-13 LAB — BASIC METABOLIC PANEL
Anion gap: 10 (ref 5–15)
BUN: 14 mg/dL (ref 6–20)
CALCIUM: 8.4 mg/dL — AB (ref 8.9–10.3)
CHLORIDE: 104 mmol/L (ref 101–111)
CO2: 25 mmol/L (ref 22–32)
CREATININE: 0.92 mg/dL (ref 0.61–1.24)
Glucose, Bld: 98 mg/dL (ref 65–99)
Potassium: 3.4 mmol/L — ABNORMAL LOW (ref 3.5–5.1)
SODIUM: 139 mmol/L (ref 135–145)

## 2017-06-13 LAB — GLUCOSE, CAPILLARY: Glucose-Capillary: 93 mg/dL (ref 65–99)

## 2017-06-13 MED ORDER — METOPROLOL TARTRATE 25 MG PO TABS
25.0000 mg | ORAL_TABLET | Freq: Two times a day (BID) | ORAL | Status: DC
  Administered 2017-06-13: 25 mg via ORAL
  Filled 2017-06-13: qty 1

## 2017-06-13 MED ORDER — TRAMADOL HCL 50 MG PO TABS: ORAL_TABLET | ORAL | 0 refills | Status: DC

## 2017-06-13 MED ORDER — ASPIRIN 325 MG PO TBEC: 325.0000 mg | DELAYED_RELEASE_TABLET | Freq: Every day | ORAL | 0 refills | Status: DC

## 2017-06-13 MED ORDER — METOPROLOL TARTRATE 25 MG PO TABS: 25.0000 mg | ORAL_TABLET | Freq: Two times a day (BID) | ORAL | 1 refills | Status: DC

## 2017-06-13 MED ORDER — POTASSIUM CHLORIDE CRYS ER 20 MEQ PO TBCR
20.0000 meq | EXTENDED_RELEASE_TABLET | Freq: Once | ORAL | Status: AC
  Administered 2017-06-13: 20 meq via ORAL
  Filled 2017-06-13: qty 1

## 2017-06-13 MED ORDER — LISINOPRIL 10 MG PO TABS: 10.0000 mg | ORAL_TABLET | Freq: Every day | ORAL | 1 refills | Status: DC

## 2017-06-13 MED ORDER — METOPROLOL TARTRATE 50 MG PO TABS: 50.0000 mg | ORAL_TABLET | Freq: Two times a day (BID) | ORAL | 1 refills | Status: DC

## 2017-06-13 MED ORDER — AMIODARONE HCL 200 MG PO TABS: 200.0000 mg | ORAL_TABLET | Freq: Every day | ORAL | 1 refills | Status: DC

## 2017-06-13 NOTE — Care Management Important Message (Signed)
Important Message  Patient Details  Name: Patrick Nguyen MRN: [id number] Date of Birth: March 11, 1941   Medicare Important Message Given:  Yes    Stepehn Eckard Montine Circle 06/13/2017, 12:07 PM

## 2017-06-13 NOTE — Progress Notes (Signed)
CARDIAC REHAB PHASE I   PRE:  Rate/Rhythm: 71 SR with a short burst of frequent PACs   BP:  Sitting: 143/46     SaO2: 93% RA  MODE:  Ambulation: 390 ft   POST:  Rate/Rhythm: 95 ST  BP:  Sitting: 150/71    SaO2: 90% RA  [id number] Pt ambulated with stand by assist. No complaints. Education completed including IS use, Diet, Exercise Guidelines, and sternal precautions.  Handouts given to patient. Verbalized understanding. CRP II Referral made for [place].    Noel Christmas, RN 06/13/2017 9:30 AM

## 2017-06-13 NOTE — Care Management Important Message (Signed)
Important Message  Patient Details  Name: Patrick Nguyen MRN: [id number] Date of Birth: [birth year]   Medicare Important Message Given:  Yes    Erenest Rasher, RN 06/13/2017, 11:36 AM

## 2017-06-13 NOTE — Care Management Note (Signed)
Case Management Note  Patient Details  Name: Patrick Nguyen MRN: [id number] Date of Birth: Feb 24, 1941  Subjective/Objective:     CABG  x3               Action/Plan: Spoke to pt and states no NCM needs. Has good family support. Will continue to follow for dc needs.   PCP [name] MD  Expected Discharge Date:                 Expected Discharge Plan:  Home/Self Care  In-House Referral:  NA  Discharge planning Services  CM Consult  Post Acute Care Choice:  NA Choice offered to:  NA  DME Arranged:  N/A DME Agency:  NA  HH Arranged:  NA HH Agency:  NA  Status of Service:  Completed, signed off  If discussed at [place] of Stay Meetings, dates discussed:    Additional Comments:  Erenest Rasher, RN 06/13/2017, 11:37 AM

## 2017-06-13 NOTE — Progress Notes (Addendum)
      [address]       [place] [address]             [phone]        6 Days Post-Op Procedure(s) (LRB): CORONARY ARTERY BYPASS GRAFTING (CABG) x three, using left internal mammary artery and right leg greater saphenous vein harvested endoscopically (N/A) TRANSESOPHAGEAL ECHOCARDIOGRAM (TEE) (N/A)  Subjective: Patient without complaints. He would like to go home.  Objective: Vital signs in last 24 hours: Temp:  [97.8 F (36.6 C)-98.6 F (37 C)] 98.6 F (37 C) (03/04 0556) Pulse Rate:  [72-105] 105 (03/03 1956) Cardiac Rhythm: Other (Comment) (03/03 2002) Resp:  [18-21] 18 (03/04 0650) BP: (125-156)/(71-122) 148/71 (03/04 0650) SpO2:  [90 %-94 %] 94 % (03/04 0556) Weight:  [185 lb 6.4 oz (84.1 kg)] 185 lb 6.4 oz (84.1 kg) (03/04 0556)  Pre op weight 86 kg Current Weight  06/13/17 185 lb 6.4 oz (84.1 kg)      Intake/Output from previous day: 03/03 0701 - 03/04 0700 In: 1740 [P.O.:1740] Out: 200 [Urine:200]   Physical Exam:  Cardiovascular: RRR Pulmonary: Slightly diminished at bases Abdomen: Soft, non tender, bowel sounds present. Extremities: Nol lower extremity edema. Wounds: Clean and dry.  No erythema or signs of infection.  Lab Results: CBC: Recent Labs    06/11/17 0825 06/12/17 0410  WBC 7.9 7.6  HGB 10.0* 10.1*  HCT 30.8* 30.9*  PLT 186 221   BMET:  Recent Labs    06/12/17 0410 06/13/17 0224  NA 137 139  K 3.4* 3.4*  CL 103 104  CO2 24 25  GLUCOSE 83 98  BUN 16 14  CREATININE 0.94 0.92  CALCIUM 8.2* 8.4*    PT/INR:  Lab Results  Component Value Date   INR 1.38 06/07/2017   INR 0.95 06/03/2017   INR 1.00 05/19/2017   ABG:  INR: Will add last result for INR, ABG once components are confirmed Will add last 4 CBG results once components are confirmed  Assessment/Plan:  1. CV - Previous a fib. SR in the 60's. On Amiodarone 200 mg daily, Lopressor 50 mg bid, and Lisinopril 10 mg daily. Will decrease  Lopressor to 25 mg bid as HR mostly in the 60's. 2.  Pulmonary - On room air. Encourage incentive spirometer. 3. Volume Overload - On Lasix 40 mg daily. 4.  Acute blood loss anemia - H and H stable at 10.1 and 30.9 5. Supplement potassium 6. Remove EPW 7. Likely discharge home later today  Naw Lasala M ZimmermanPA-C 06/13/2017,7:07 AM

## 2017-06-15 ENCOUNTER — Telehealth (HOSPITAL_COMMUNITY): Payer: Self-pay

## 2017-06-15 DIAGNOSIS — I4891 Unspecified atrial fibrillation: Secondary | ICD-10-CM | POA: Diagnosis not present

## 2017-06-15 DIAGNOSIS — I25810 Atherosclerosis of coronary artery bypass graft(s) without angina pectoris: Secondary | ICD-10-CM | POA: Diagnosis not present

## 2017-06-15 NOTE — Telephone Encounter (Signed)
Patients insurance is active and benefits verified through Medicare A/B - No co-pay, deductible amount of $185.00/$185.00 has been met, no out of pocket, 20% co-insurance, and no pre-authorization is required. Passport/reference [id number]  Will contact patient to see if interested in the program. If interested, patient will need to complete follow up appt. Once completed, patient will be contacted for scheduling upon review by the RN Navigator.

## 2017-06-15 NOTE — Telephone Encounter (Signed)
Called to speak with patient to see if interested in CR - patient is interested in the program. Explained scheduling process to patient and will call patient for scheduling after follow up appt is completed on 06/28/17. Went over insurance with patient and patient stated he understands.

## 2017-06-28 ENCOUNTER — Encounter: Payer: Self-pay | Admitting: Physician Assistant

## 2017-06-28 ENCOUNTER — Ambulatory Visit (INDEPENDENT_AMBULATORY_CARE_PROVIDER_SITE_OTHER): Payer: Medicare Other | Admitting: Physician Assistant

## 2017-06-28 VITALS — BP 116/60 | HR 54 | Ht 70.0 in | Wt 174.2 lb

## 2017-06-28 DIAGNOSIS — I2510 Atherosclerotic heart disease of native coronary artery without angina pectoris: Secondary | ICD-10-CM | POA: Diagnosis not present

## 2017-06-28 DIAGNOSIS — E785 Hyperlipidemia, unspecified: Secondary | ICD-10-CM | POA: Diagnosis not present

## 2017-06-28 DIAGNOSIS — I10 Essential (primary) hypertension: Secondary | ICD-10-CM

## 2017-06-28 MED ORDER — LISINOPRIL 5 MG PO TABS: 5.0000 mg | ORAL_TABLET | Freq: Every day | ORAL | 3 refills | Status: DC

## 2017-06-28 NOTE — Patient Instructions (Signed)
Medication Instructions:  DECREASE lisinopril to 5 mg daily  Follow-Up: 3 months with Dr. Percival Spanish  Any Other Special Instructions Will Be Listed Below (If Applicable).  Goal blood pressure is 130/80   If you need a refill on your cardiac medications before your next appointment, please call your pharmacy.

## 2017-06-28 NOTE — Progress Notes (Addendum)
Cardiology Office Note   Date:  06/28/2017   ID:  Patrick Nguyen, DOB [birth year], MRN [id number]  PCP:  [name], MD  Cardiologist:  Dr Percival Spanish,  05/13/2017 Rosaria Ferries, PA-C   Chief Complaint  Patient presents with  . Follow-up    pt denied chest pain and is feeling little tingling in incision    History of Present Illness: Patrick Nguyen is a 77 y.o. male with a history of HTN, HLD, GERD, MI, OA, rosaccea.  05/13/2017 office visit, records from [place] cardiologist reviewed, abnl calcium score and abnl MV with scar and peri-infarct ischemia, EF 61%>>Cath w/ 3v dz>>TCTS referral Admitted 02/26-03/07/2017 for CABG x 2 w/ LIMA-LAD, SVG-OM, SVG-PDA  Patrick Nguyen presents for cardiology follow up.  He is making progress every day. He is cooking some, but not doing anything strenuous.   He is watching the sodium in his diet.   He is walking some, more when the weather is better.   No LE edema, no orthopnea or PND. He has DOE but it is getting better.   The incision is healing well. No drainage or fevers.   No palpitations, no presyncope or syncope.  PCP follows his cholesterol.  His blood pressure has been normal.  He is a little concerned because his stools have been loose.  He wonders if the medications he is on are causing this.   Past Medical History:  Diagnosis Date  . Anginal pain ([place])   . Arthritis   . Dyspnea    w/ exertion   . Elevated coronary artery calcium score   . GERD (gastroesophageal reflux disease)    hx  . Hepatitis    in   high  school    . Hyperlipidemia   . Hypertension   . Myocardial infarction ([place])   . Rosacea     Past Surgical History:  Procedure Laterality Date  . CARDIAC CATHETERIZATION    . CORONARY ARTERY BYPASS GRAFT N/A 06/07/2017   Procedure: CORONARY ARTERY BYPASS GRAFTING (CABG) x three, using left internal mammary artery and right leg greater saphenous vein harvested endoscopically;  Surgeon: Ivin Poot, MD;  Location: [facility];  Service: Open Heart Surgery;  Laterality: N/A;  . INTRAVASCULAR PRESSURE WIRE/FFR STUDY N/A 05/19/2017   Procedure: INTRAVASCULAR PRESSURE WIRE/FFR STUDY;  Surgeon: [name], MD;  Location: [facility];  Service: Cardiovascular;  Laterality: N/A;  . LEFT HEART CATH AND CORONARY ANGIOGRAPHY N/A 05/19/2017   Procedure: LEFT HEART CATH AND CORONARY ANGIOGRAPHY;  Surgeon: [name], MD;  Location: [facility];  Service: Cardiovascular;  Laterality: N/A;  . ROTATOR CUFF REPAIR Left   . TEE WITHOUT CARDIOVERSION N/A 06/07/2017   Procedure: TRANSESOPHAGEAL ECHOCARDIOGRAM (TEE);  Surgeon: Prescott Gum, Collier Salina, MD;  Location: [facility];  Service: Open Heart Surgery;  Laterality: N/A;    Current Outpatient Medications  Medication Sig Dispense Refill  . amiodarone (PACERONE) 200 MG tablet Take 1 tablet (200 mg total) by mouth daily. 30 tablet 1  . aspirin EC 325 MG EC tablet Take 1 tablet (325 mg total) by mouth daily. 30 tablet 0  . lisinopril (PRINIVIL,ZESTRIL) 10 MG tablet Take 1 tablet (10 mg total) by mouth daily. 30 tablet 1  . metoprolol tartrate (LOPRESSOR) 25 MG tablet Take 1 tablet (25 mg total) by mouth 2 (two) times daily. 60 tablet 1  . rosuvastatin (CRESTOR) 20 MG tablet Take 20 mg by mouth every evening.  No current facility-administered medications for this visit.     Allergies:   Atorvastatin    Social History:  The patient  reports that he quit smoking about 31 years ago. he has never used smokeless tobacco. He reports that he drinks about 1.8 oz of alcohol per week. He reports that he does not use drugs.   Family History:  The patient's family history includes AAA (abdominal aortic aneurysm) in his father; Cancer in his father; Heart disease (age of onset: 45) in his mother; Hypertension in his mother.    ROS:  Please see the history of present illness. All other systems are reviewed and negative.    PHYSICAL EXAM: VS:  BP 116/60    Pulse (!) 54   Ht 5\' 10"  (1.778 m)   Wt 174 lb 3.2 oz (79 kg)   BMI 25.00 kg/m  , BMI Body mass index is 25 kg/m. GEN: Well nourished, well developed, male in no acute distress  HEENT: normal for age  Neck: no JVD, no carotid bruit, no masses Cardiac: RRR; no murmur, no rubs, or gallops Respiratory:  clear to auscultation bilaterally, normal work of breathing, incisions are healing well with no signs of infection. GI: soft, nontender, nondistended, + BS MS: no deformity or atrophy; no edema; distal pulses are 2+ in all 4 extremities   Skin: warm and dry, no rash; minor erythema around the lower right leg incision, no drainage or swelling. Neuro:  Strength and sensation are intact Psych: euthymic mood, full affect   EKG:  EKG is ordered today. The ekg ordered today demonstrates sinus bradycardia, heart rate 53, nonspecific T wave abnormality, inferior T waves are changed from ECG dated 06/11/2017   Recent Labs: 05/16/2017: TSH 2.260 06/03/2017: ALT 24 06/12/2017: Hemoglobin 10.1; Magnesium 2.0; Platelets 221 06/13/2017: BUN 14; Creatinine, Ser 0.92; Potassium 3.4; Sodium 139    Lipid Panel No results found for: CHOL, TRIG, HDL, CHOLHDL, VLDL, LDLCALC, LDLDIRECT   Wt Readings from Last 3 Encounters:  06/28/17 174 lb 3.2 oz (79 kg)  06/13/17 185 lb 6.4 oz (84.1 kg)  06/03/17 189 lb 9.6 oz (86 kg)     Other studies Reviewed: Additional studies/ records that were reviewed today include: Office notes, hospital records and testing.  ASSESSMENT AND PLAN:  1.  CAD, s/p CABG: He is recovering well.  He is advised to continue following the guidelines on his discharge paperwork regarding how much he can lift.  However, it is okay for him to increase his walking as tolerated.  He states that his ambulation is improving and he will continue to work on this.  Keep scheduled follow-up appointments.  2.  Hyperlipidemia: This is followed by Dr. Inda Merlin.  His last LDL was 67.  3.  Hypertension:  His blood pressure is well controlled on current medications.  However, because of the loose stools I will decrease the lisinopril to 5 mg daily and see if this helps his symptoms.  4.  Loose stools: They have been like this since the surgery.  Possibly side effect of medications, we will start by cutting back the lisinopril and see if this helps.  5.  PAF: He had this postoperatively.  It is the reason for the amiodarone.  He has had no palpitations since the surgery.  Continue beta-blocker.  He is bradycardic, but asymptomatic with this so no dose change. **Dr. Percival Spanish to address if we can stop the amiodarone**   Current medicines are reviewed at length with the  patient today.  The patient has concerns regarding medicines.  Concerns were addressed  The following changes have been made: Decrease lisinopril  Labs/ tests ordered today include:  No orders of the defined types were placed in this encounter.    Disposition:   FU with Dr Percival Spanish  Signed, Rosaria Ferries, PA-C  06/28/2017 11:42 AM    [place] Phone: [phone]; Fax: [phone]  This note was written with the assistance of speech recognition software. Please excuse any transcriptional errors.

## 2017-07-01 ENCOUNTER — Telehealth (HOSPITAL_COMMUNITY): Payer: Self-pay

## 2017-07-01 NOTE — Progress Notes (Signed)
OK to stop amiodarone

## 2017-07-01 NOTE — Telephone Encounter (Signed)
Called to speak with patient in regards to Cardiac Rehab - Scheduled orientation on 08/25/2017 at 8:45am. Patient will attend the 9:45am exc class. Mailed packet.

## 2017-07-11 ENCOUNTER — Telehealth: Payer: Self-pay

## 2017-07-11 NOTE — Telephone Encounter (Signed)
-----   Message from Lonn Georgia, PA-C sent at 07/07/2017  8:42 AM EDT ----- Please let him know that Dr. Percival Spanish said it is okay to stop the amiodarone.  Thanks

## 2017-07-11 NOTE — Telephone Encounter (Signed)
Left detailed message on patients voicemail, per drp, informing patient that Dr Percival Spanish said it is ok to discontinue Pacerone. Advised to cotnact office for questions and concerns.

## 2017-07-13 ENCOUNTER — Telehealth: Payer: Self-pay | Admitting: Cardiology

## 2017-07-13 ENCOUNTER — Ambulatory Visit: Payer: Medicare Other | Admitting: Cardiothoracic Surgery

## 2017-07-13 MED ORDER — METOPROLOL TARTRATE 25 MG PO TABS: 25.0000 mg | ORAL_TABLET | Freq: Two times a day (BID) | ORAL | 3 refills | Status: DC

## 2017-07-13 MED ORDER — LISINOPRIL 5 MG PO TABS: 5.0000 mg | ORAL_TABLET | Freq: Every day | ORAL | 3 refills | Status: DC

## 2017-07-13 NOTE — Telephone Encounter (Signed)
Patient has been made aware that the medications have been sent in for refills. He verbalized his understanding.

## 2017-07-13 NOTE — Telephone Encounter (Signed)
New message   Does patient need to continue taking this medication?  If so he will need refills called in to pharmacy    *STAT* If patient is at the pharmacy, call can be transferred to refill team.   1. Which medications need to be refilled? (please list name of each medication and dose if known)    lisinopril (PRINIVIL,ZESTRIL) 5 MG tablet Take 1 tablet (5 mg total) by mouth daily.   metoprolol tartrate (LOPRESSOR) 25 MG tablet Take 1 tablet (25 mg total) by mouth 2 (two) times daily.        2. Which pharmacy/location (including street and city if local pharmacy) is medication to be sent to? [facility]   3. Do they need a 30 day or 90 day supply?  [place]

## 2017-07-14 ENCOUNTER — Telehealth: Payer: Self-pay | Admitting: Cardiology

## 2017-07-14 NOTE — Telephone Encounter (Signed)
Closed Encounter  °

## 2017-07-19 ENCOUNTER — Other Ambulatory Visit: Payer: Self-pay | Admitting: Cardiothoracic Surgery

## 2017-07-19 DIAGNOSIS — Z951 Presence of aortocoronary bypass graft: Secondary | ICD-10-CM

## 2017-07-20 ENCOUNTER — Encounter: Payer: Self-pay | Admitting: Cardiothoracic Surgery

## 2017-07-20 ENCOUNTER — Other Ambulatory Visit: Payer: Self-pay

## 2017-07-20 ENCOUNTER — Ambulatory Visit (INDEPENDENT_AMBULATORY_CARE_PROVIDER_SITE_OTHER): Payer: Self-pay | Admitting: Cardiothoracic Surgery

## 2017-07-20 ENCOUNTER — Ambulatory Visit
Admission: RE | Admit: 2017-07-20 | Discharge: 2017-07-20 | Disposition: A | Discharge status: Home / Self Care | Payer: Medicare Other | Source: Ambulatory Visit | Attending: Cardiothoracic Surgery | Admitting: Cardiothoracic Surgery

## 2017-07-20 VITALS — BP 130/64 | HR 53 | Resp 18 | Ht 70.0 in | Wt 168.0 lb

## 2017-07-20 DIAGNOSIS — Z951 Presence of aortocoronary bypass graft: Secondary | ICD-10-CM

## 2017-07-20 MED ORDER — ROSUVASTATIN CALCIUM 20 MG PO TABS: 20.0000 mg | ORAL_TABLET | Freq: Every evening | ORAL | 2 refills | Status: AC

## 2017-07-20 NOTE — Progress Notes (Signed)
PCP is [name], MD Referring Provider is Minus Breeding, MD  Chief Complaint  Patient presents with  . Coronary Artery Disease    f/u with chest xray, s/p CABG x3 06/07/2017    HPI: Patient returns for scheduled postop visit 1 month after urgent CABG x3 for unstable angina.  Patient did well postop.  The patient had transient postoperative atrial fibrillation and was discharged home on amiodarone which has been discontinued.  He is walking 20 minutes daily.  He is scheduled to be oriented in cardiac rehab later this month.  He is ready to begin driving and doing normal daily activities.  We discussed his limitations on lifting up to 20 pounds maximum, no golfing.  Sling until June, no yard work until June.  The patient denies any problems with recurrent angina.  Surgical incisions are healing well.  His appetite has been poor and he is lost approximately 10-12 pounds which is expected.  Appetite is now improving.  Chest x-ray performed today reveals clear lung fields, no pleural effusion, sternal wires intact.  Past Medical History:  Diagnosis Date  . Anginal pain ([facility]) 05/13/2017  . Arthritis   . CAD (coronary artery disease) 05/13/2017   s/p CABG w/ / LIMA-LAD, SVG-OM, SVG-PDA  . Dyspnea    w/ exertion   . Elevated coronary artery calcium score   . GERD (gastroesophageal reflux disease)    hx  . Hepatitis    in   high  school    . Hyperlipidemia   . Hypertension   . Myocardial infarction ([facility])   . Rosacea     Past Surgical History:  Procedure Laterality Date  . CARDIAC CATHETERIZATION    . CORONARY ARTERY BYPASS GRAFT N/A 06/07/2017   Procedure: CORONARY ARTERY BYPASS GRAFTING (CABG) x three, using left internal mammary artery and right leg greater saphenous vein harvested endoscopically;  Surgeon: Ivin Poot, MD;  Location: [facility];  Service: Open Heart Surgery;  Laterality: N/A;  . INTRAVASCULAR PRESSURE WIRE/FFR STUDY N/A 05/19/2017   Procedure: INTRAVASCULAR  PRESSURE WIRE/FFR STUDY;  Surgeon: [name], MD;  Location: [facility];  Service: Cardiovascular;  Laterality: N/A;  . LEFT HEART CATH AND CORONARY ANGIOGRAPHY N/A 05/19/2017   Procedure: LEFT HEART CATH AND CORONARY ANGIOGRAPHY;  Surgeon: [name], MD;  Location: [facility];  Service: Cardiovascular;  Laterality: N/A;  . ROTATOR CUFF REPAIR Left   . TEE WITHOUT CARDIOVERSION N/A 06/07/2017   Procedure: TRANSESOPHAGEAL ECHOCARDIOGRAM (TEE);  Surgeon: Prescott Gum, Collier Salina, MD;  Location: [facility];  Service: Open Heart Surgery;  Laterality: N/A;    Family History  Problem Relation Age of Onset  . Heart disease Mother 77  . Hypertension Mother   . Cancer Father        Larynx  . AAA (abdominal aortic aneurysm) Father     Social History Social History   Tobacco Use  . Smoking status: Former Smoker    Last attempt to quit: 05/13/1986    Years since quitting: 31.2  . Smokeless tobacco: Never Used  Substance Use Topics  . Alcohol use: Yes    Alcohol/week: 1.8 oz    Types: 2 Cans of beer, 1 Glasses of wine per week    Comment: daily  . Drug use: No    Current Outpatient Medications  Medication Sig Dispense Refill  . aspirin EC 325 MG EC tablet Take 1 tablet (325 mg total) by mouth daily. 30 tablet 0  .  lisinopril (PRINIVIL,ZESTRIL) 5 MG tablet Take 1 tablet (5 mg total) by mouth daily. 30 tablet 3  . metoprolol tartrate (LOPRESSOR) 25 MG tablet Take 1 tablet (25 mg total) by mouth 2 (two) times daily. 60 tablet 3  . rosuvastatin (CRESTOR) 20 MG tablet Take 1 tablet (20 mg total) by mouth every evening. 30 tablet 2   No current facility-administered medications for this visit.     Allergies  Allergen Reactions  . Atorvastatin Other (See Comments)    Made pt not like himself    Review of Systems  Improved appetite and strength No fever No dizziness Difficulty swallowing  BP 130/64 (BP Location: Left Arm, Patient Position: Sitting, Cuff Size: Normal)    Pulse (!) 53   Resp 18   Ht 5\' 10"  (1.778 m)   Wt 168 lb (76.2 kg)   SpO2 95% Comment: RA  BMI 24.11 kg/m  Physical Exam      Exam    General- alert and comfortable    Neck- no JVD, no cervical adenopathy palpable, no carotid bruit   Lungs- clear without rales, wheezes   Cor- regular rate and rhythm, no murmur , gallop   Abdomen- soft, non-tender   Extremities - warm, non-tender, minimal edema   Neuro- oriented, appropriate, no focal weakness   Diagnostic Tests: Chest x-ray clear  Impression: Excellent early recovery after urgent CABG x3  Plan: Continue current medications including metoprolol, Crestor, aspirin, and lisinopril. Patient may drive, start phase 2 cardiac rehab, lift up to 20 pounds maximum until June 1. Return as needed for any surgical issues. [name], MD [facility] [phone]

## 2017-08-25 ENCOUNTER — Ambulatory Visit (HOSPITAL_COMMUNITY): Payer: Medicare Other

## 2017-08-29 ENCOUNTER — Encounter (HOSPITAL_COMMUNITY): Payer: Medicare Other

## 2017-08-31 ENCOUNTER — Encounter (HOSPITAL_COMMUNITY): Payer: Medicare Other

## 2017-09-02 ENCOUNTER — Encounter (HOSPITAL_COMMUNITY): Payer: Medicare Other

## 2017-09-07 ENCOUNTER — Encounter (HOSPITAL_COMMUNITY): Payer: Medicare Other

## 2017-09-09 ENCOUNTER — Encounter (HOSPITAL_COMMUNITY): Payer: Medicare Other

## 2017-09-12 ENCOUNTER — Encounter (HOSPITAL_COMMUNITY): Payer: Medicare Other

## 2017-09-14 ENCOUNTER — Encounter (HOSPITAL_COMMUNITY): Payer: Medicare Other

## 2017-09-15 DIAGNOSIS — M25511 Pain in right shoulder: Secondary | ICD-10-CM | POA: Diagnosis not present

## 2017-09-15 DIAGNOSIS — Z79899 Other long term (current) drug therapy: Secondary | ICD-10-CM | POA: Diagnosis not present

## 2017-09-15 DIAGNOSIS — I4891 Unspecified atrial fibrillation: Secondary | ICD-10-CM | POA: Diagnosis not present

## 2017-09-15 DIAGNOSIS — M25649 Stiffness of unspecified hand, not elsewhere classified: Secondary | ICD-10-CM | POA: Diagnosis not present

## 2017-09-15 DIAGNOSIS — I25810 Atherosclerosis of coronary artery bypass graft(s) without angina pectoris: Secondary | ICD-10-CM | POA: Diagnosis not present

## 2017-09-15 DIAGNOSIS — E785 Hyperlipidemia, unspecified: Secondary | ICD-10-CM | POA: Diagnosis not present

## 2017-09-16 ENCOUNTER — Encounter (HOSPITAL_COMMUNITY): Payer: Medicare Other

## 2017-09-16 DIAGNOSIS — H5203 Hypermetropia, bilateral: Secondary | ICD-10-CM | POA: Diagnosis not present

## 2017-09-16 DIAGNOSIS — H2513 Age-related nuclear cataract, bilateral: Secondary | ICD-10-CM | POA: Diagnosis not present

## 2017-09-19 ENCOUNTER — Encounter (HOSPITAL_COMMUNITY): Payer: Medicare Other

## 2017-09-21 ENCOUNTER — Encounter (HOSPITAL_COMMUNITY): Payer: Medicare Other

## 2017-09-21 ENCOUNTER — Telehealth (HOSPITAL_COMMUNITY): Payer: Self-pay | Admitting: Pharmacist

## 2017-09-21 NOTE — Telephone Encounter (Signed)
Cardiac Rehab Medication Review by a Pharmacist  Does the patient feel that his/her medications are working for him/her?  yes  Has the patient been experiencing any side effects to the medications prescribed?  no  Does the patient measure his/her own blood pressure or blood glucose at home?  no  Does the patient have any problems obtaining medications due to transportation or finances?   yes  Understanding of regimen: excellent Understanding of indications: good Potential of compliance: good   Pharmacist comments: Mr. Patrick Nguyen is a pleasant 77 y/o male who endorses compliance with all medications. Notably, he takes 1/2 tablet of his lisinopril so he gets only 2.5 mg daily instead of 5 mg. He does not take his BP at home but it is usually "fine" whenever he gets it checked in clinic visits. He reports no other issues and he does have an appointment on Friday 6/14 with his cardiologist.   Charlene Brooke, PharmD PGY1 Pharmacy Resident Phone: [phone] Email: [email or web address] 09/21/2017 5:54 PM

## 2017-09-22 NOTE — Progress Notes (Signed)
Cardiology Office Note    Date:  09/23/2017   ID:  Patrick Nguyen, DOB 17-Jan-1941, MRN [id number]  PCP:  [name], MD  Cardiologist:   No primary care provider on file. Referring:  [name], MD   Chief Complaint  Patient presents with  . Coronary Artery Disease      History of Present Illness: Patrick Nguyen is a 77 y.o. male who was referred by [name], MD for evaluation of CAD.  He had moved here from [place].   He had shortness of breath in 2016.  Stress echocardiogram in 2016 demonstrated a well-preserved ejection fraction.  There were no wall motion abnormalities.   There was another stress echo in December 2017 that  also demonstrated no wall motion abnormalities.  This year when he was again seen by cardiology had some palpitations and may be some vague chest discomfort.  They ordered a coronary calcium score.  This came back at 400 with 3 vessel distribution.  I was able to speak with his cardiologist and get the results of the stress test that they did prior to him moving from [place].   He was found to have a fixed defect with peri-infarct ischemia.  There was ST depression post exercise.  His ejection fraction was 61%.  He did wear a Holter and was found to have some nonsustained cardia atrial tachycardia, some PVCs and one 3 beat run of nonsustained V. tach.  After he moved here we sent him for a cardiac cath and he was found to have 3 vessel disease.  He is now status post CABG and returns for follow up.    He has done very well.  The patient denies any new symptoms such as chest discomfort, neck or arm discomfort. There has been no new shortness of breath, PND or orthopnea. There have been no reported palpitations, presyncope or syncope.  He is doing some walking around the neighborhood.  He is going to start cardiac rehab.     Past Medical History:  Diagnosis Date  . Anginal pain ([place]) 05/13/2017  . Arthritis   . CAD (coronary artery disease) 05/13/2017   s/p CABG w/ / LIMA-LAD, SVG-OM, SVG-PDA  . Dyspnea    w/ exertion   . Elevated coronary artery calcium score   . GERD (gastroesophageal reflux disease)    hx  . Hepatitis    in   high  school    . Hyperlipidemia   . Hypertension   . Myocardial infarction ([place])   . Rosacea      Current Outpatient Medications  Medication Sig Dispense Refill  . aspirin EC 81 MG tablet Take 81 mg by mouth daily.    Marland Kitchen lisinopril (PRINIVIL,ZESTRIL) 2.5 MG tablet Take 2.5 mg by mouth daily.    . metoprolol tartrate (LOPRESSOR) 25 MG tablet Take 1 tablet (25 mg total) by mouth 2 (two) times daily. 60 tablet 3  . rosuvastatin (CRESTOR) 20 MG tablet Take 1 tablet (20 mg total) by mouth every evening. 30 tablet 2   No current facility-administered medications for this visit.     Allergies:   Atorvastatin   ROS:  Please see the history of present illness.   Otherwise, review of systems are positive for none.   All other systems are reviewed and negative.    PHYSICAL EXAM: VS:  BP 138/60   Pulse (!) 56   Ht 5' 10.5" (1.791 m)   Wt 169 lb 9.6 oz (76.9 kg)  SpO2 92%   BMI 23.99 kg/m  , BMI Body mass index is 23.99 kg/m.  GENERAL:  Well appearing NECK:  No jugular venous distention, waveform within normal limits, carotid upstroke brisk and symmetric, no bruits, no thyromegaly LUNGS:  Clear to auscultation bilaterally CHEST:  Well healed sternotomy scar. HEART:  PMI not displaced or sustained,S1 and S2 within normal limits, no S3, no S4, no clicks, no rubs, no murmurs ABD:  Flat, positive bowel sounds normal in frequency in pitch, no bruits, no rebound, no guarding, no midline pulsatile mass, no hepatomegaly, no splenomegaly EXT:  2 plus pulses throughout, no edema, no cyanosis no clubbing    EKG:  EKG is not ordered today.    Recent Labs: 05/16/2017: TSH 2.260 06/03/2017: ALT 24 06/12/2017: Hemoglobin 10.1; Magnesium 2.0; Platelets 221 06/13/2017: BUN 14; Creatinine, Ser 0.92; Potassium 3.4; Sodium  139    Lipid Panel No results found for: CHOL, TRIG, HDL, CHOLHDL, VLDL, LDLCALC, LDLDIRECT    Wt Readings from Last 3 Encounters:  09/23/17 169 lb 9.6 oz (76.9 kg)  07/20/17 168 lb (76.2 kg)  06/28/17 174 lb 3.2 oz (79 kg)      Other studies Reviewed: Additional studies/ records that were reviewed today include: None Review of the above records demonstrates:     ASSESSMENT AND PLAN:  CAD/CABG:  The patient has no new sypmtoms.  No further cardiovascular testing is indicated.  We will continue with aggressive risk reduction and meds as listed.  DYSLIPIDEMIA:   LDL was 67 and HDL 57.  I will check a lipid profile.   HTN:  The blood pressure is at target. No change in medications is indicated. We will continue with therapeutic lifestyle changes (TLC).  ATRIAL FIB:  This was post CABG.  He is off the amiodarone.  He has had no recurrence.      Current medicines are reviewed at length with the patient today.  The patient does not have concerns regarding medicines.  The following changes have been made:  As above.    Labs/ tests ordered today include:   Orders Placed This Encounter  Procedures  . Lipid panel  . Hepatic function panel     Disposition:   FU with me in six months.  Ronnell Guadalajara, MD  09/23/2017 9:29 AM    [facility]

## 2017-09-23 ENCOUNTER — Encounter: Payer: Self-pay | Admitting: Cardiology

## 2017-09-23 ENCOUNTER — Encounter (HOSPITAL_COMMUNITY): Payer: Medicare Other

## 2017-09-23 ENCOUNTER — Ambulatory Visit (INDEPENDENT_AMBULATORY_CARE_PROVIDER_SITE_OTHER): Payer: Medicare Other | Admitting: Cardiology

## 2017-09-23 VITALS — BP 138/60 | HR 56 | Ht 70.5 in | Wt 169.6 lb

## 2017-09-23 DIAGNOSIS — I10 Essential (primary) hypertension: Secondary | ICD-10-CM | POA: Diagnosis not present

## 2017-09-23 DIAGNOSIS — E785 Hyperlipidemia, unspecified: Secondary | ICD-10-CM | POA: Diagnosis not present

## 2017-09-23 DIAGNOSIS — I2510 Atherosclerotic heart disease of native coronary artery without angina pectoris: Secondary | ICD-10-CM | POA: Diagnosis not present

## 2017-09-23 DIAGNOSIS — Z79899 Other long term (current) drug therapy: Secondary | ICD-10-CM

## 2017-09-23 NOTE — Patient Instructions (Signed)
Medication Instructions:  DECREASE- Aspirin 81 mg daily  If you need a refill on your cardiac medications before your next appointment, please call your pharmacy.  Labwork: Fasting Lipids Liver HERE IN OUR OFFICE AT [facility]  Take the provided lab slips with you to the lab for your blood draw.   You will need to fast. DO NOT EAT OR DRINK PAST MIDNIGHT.   Testing/Procedures: None Ordered   Follow-Up: Your physician wants you to follow-up in: 6 Months. You should receive a reminder letter in the mail two months in advance. If you do not receive a letter, please call our office [phone].      Thank you for choosing [facility] at [facility]!!

## 2017-09-26 ENCOUNTER — Encounter (HOSPITAL_COMMUNITY): Payer: Medicare Other

## 2017-09-26 ENCOUNTER — Ambulatory Visit: Payer: Medicare Other | Admitting: Cardiology

## 2017-09-26 DIAGNOSIS — Z79899 Other long term (current) drug therapy: Secondary | ICD-10-CM | POA: Diagnosis not present

## 2017-09-26 DIAGNOSIS — E785 Hyperlipidemia, unspecified: Secondary | ICD-10-CM | POA: Diagnosis not present

## 2017-09-26 DIAGNOSIS — I10 Essential (primary) hypertension: Secondary | ICD-10-CM | POA: Diagnosis not present

## 2017-09-26 LAB — HEPATIC FUNCTION PANEL
ALBUMIN: 4.1 g/dL (ref 3.5–4.8)
ALK PHOS: 60 IU/L (ref 39–117)
ALT: 25 IU/L (ref 0–44)
AST: 23 IU/L (ref 0–40)
BILIRUBIN TOTAL: 0.6 mg/dL (ref 0.0–1.2)
Bilirubin, Direct: 0.2 mg/dL (ref 0.00–0.40)
Total Protein: 6.5 g/dL (ref 6.0–8.5)

## 2017-09-26 LAB — LIPID PANEL
CHOLESTEROL TOTAL: 134 mg/dL (ref 100–199)
Chol/HDL Ratio: 1.9 ratio (ref 0.0–5.0)
HDL: 70 mg/dL (ref >39)
LDL Calculated: 49 mg/dL (ref 0–99)
TRIGLYCERIDES: 73 mg/dL (ref 0–149)
VLDL Cholesterol Cal: 15 mg/dL (ref 5–40)

## 2017-09-28 ENCOUNTER — Encounter (HOSPITAL_COMMUNITY): Payer: Medicare Other

## 2017-09-29 ENCOUNTER — Encounter (HOSPITAL_COMMUNITY): Payer: Self-pay

## 2017-09-29 ENCOUNTER — Encounter (HOSPITAL_COMMUNITY)
Admission: RE | Admit: 2017-09-29 | Discharge: 2017-09-29 | Disposition: A | Discharge status: Home / Self Care | Payer: Medicare Other | Source: Ambulatory Visit | Attending: Cardiology | Admitting: Cardiology

## 2017-09-29 VITALS — Ht 69.0 in | Wt 169.3 lb

## 2017-09-29 DIAGNOSIS — I10 Essential (primary) hypertension: Secondary | ICD-10-CM | POA: Diagnosis not present

## 2017-09-29 DIAGNOSIS — E785 Hyperlipidemia, unspecified: Secondary | ICD-10-CM | POA: Diagnosis not present

## 2017-09-29 DIAGNOSIS — Z87891 Personal history of nicotine dependence: Secondary | ICD-10-CM | POA: Insufficient documentation

## 2017-09-29 DIAGNOSIS — Z951 Presence of aortocoronary bypass graft: Secondary | ICD-10-CM | POA: Diagnosis not present

## 2017-09-29 DIAGNOSIS — M1990 Unspecified osteoarthritis, unspecified site: Secondary | ICD-10-CM | POA: Diagnosis not present

## 2017-09-29 DIAGNOSIS — Z7982 Long term (current) use of aspirin: Secondary | ICD-10-CM | POA: Insufficient documentation

## 2017-09-29 DIAGNOSIS — Z79899 Other long term (current) drug therapy: Secondary | ICD-10-CM | POA: Diagnosis not present

## 2017-09-29 DIAGNOSIS — Z7901 Long term (current) use of anticoagulants: Secondary | ICD-10-CM | POA: Insufficient documentation

## 2017-09-29 DIAGNOSIS — I219 Acute myocardial infarction, unspecified: Secondary | ICD-10-CM | POA: Diagnosis not present

## 2017-09-29 NOTE — Progress Notes (Signed)
Patrick Nguyen 77 y.o. male DOB: 1941/01/10 MRN: [id number]      Nutrition Note  1. S/P CABG (coronary artery bypass graft)    Past Medical History:  Diagnosis Date  . Anginal pain ([place]) 05/13/2017  . Arthritis   . CAD (coronary artery disease) 05/13/2017   s/p CABG w/ / LIMA-LAD, SVG-OM, SVG-PDA  . Dyspnea    w/ exertion   . Elevated coronary artery calcium score   . GERD (gastroesophageal reflux disease)    hx  . Hepatitis    in   high  school    . Hyperlipidemia   . Hypertension   . Myocardial infarction ([place])   . Rosacea    Meds reviewed. Crestor, lopressor, lisinopril noted  HT: Ht Readings from Last 1 Encounters:  09/23/17 5' 10.5" (1.791 m)    WT: Wt Readings from Last 5 Encounters:  09/23/17 169 lb 9.6 oz (76.9 kg)  07/20/17 168 lb (76.2 kg)  06/28/17 174 lb 3.2 oz (79 kg)  06/13/17 185 lb 6.4 oz (84.1 kg)  06/03/17 189 lb 9.6 oz (86 kg)       Current tobacco use? No   Labs:  Lipid Panel     Component Value Date/Time   CHOL 134 09/26/2017 0838   TRIG 73 09/26/2017 0838   HDL 70 09/26/2017 0838   CHOLHDL 1.9 09/26/2017 0838   LDLCALC 49 09/26/2017 0838    Lab Results  Component Value Date   HGBA1C 5.8 (H) 06/03/2017   CBG (last 3)  No results for input(s): GLUCAP in the last 72 hours.  Nutrition Note Spoke with pt. Nutrition plan and goals reviewed with pt. Pt is following Step 2 of the Therapeutic Lifestyle Changes diet. Pt shared that he and his wife have been following mediterranean diet, have increased lean protein, decreased refined carbohydrates/processed foods, increased complex carbohydrates consumed, and has increased healthy fats consumed in his diet. Pt set a goal to learn more about following a heart healthy diet. Pt expressed understanding of the information reviewed. Pt aware of nutrition education classes offered.  Nutrition Diagnosis ? Food-and nutrition-related knowledge deficit related to lack of exposure to information as  related to diagnosis of: ? CVD  ?  Nutrition Intervention ? Pt's individual nutrition plan and goals reviewed with pt.  Nutrition Goal(s):   ? Pt to identify and limit food sources of saturated fat, trans fat, and sodium   Plan:  Will provide client-centered nutrition education as part of interdisciplinary care.   Monitor and evaluate progress toward nutrition goal with team.  Laurina Bustle, MS, RD, LDN 09/29/2017 10:56 AM

## 2017-09-29 NOTE — Progress Notes (Signed)
Cardiac Individual Treatment Plan  Patient Details  Name: Patrick Nguyen MRN: [id number] Date of Birth: [birth year] Referring Provider:     CARDIAC REHAB PHASE II ORIENTATION from 09/29/2017 in [facility]  Referring Provider  Minus Breeding MD      Initial Encounter Date:    CARDIAC REHAB PHASE II ORIENTATION from 09/29/2017 in [facility]  Date  09/29/17  Referring Provider  Minus Breeding MD      Visit Diagnosis: S/P CABG (coronary artery bypass graft)  Patient's Home Medications on Admission:  Current Outpatient Medications:  .  aspirin EC 81 MG tablet, Take 81 mg by mouth daily., Disp: , Rfl:  .  lisinopril (PRINIVIL,ZESTRIL) 2.5 MG tablet, Take 2.5 mg by mouth daily., Disp: , Rfl:  .  metoprolol tartrate (LOPRESSOR) 25 MG tablet, Take 1 tablet (25 mg total) by mouth 2 (two) times daily., Disp: 60 tablet, Rfl: 3 .  rosuvastatin (CRESTOR) 20 MG tablet, Take 1 tablet (20 mg total) by mouth every evening., Disp: 30 tablet, Rfl: 2  Past Medical History: Past Medical History:  Diagnosis Date  . Anginal pain ([place]) 05/13/2017  . Arthritis   . CAD (coronary artery disease) 05/13/2017   s/p CABG w/ / LIMA-LAD, SVG-OM, SVG-PDA  . Dyspnea    w/ exertion   . Elevated coronary artery calcium score   . GERD (gastroesophageal reflux disease)    hx  . Hepatitis    in   high  school    . Hyperlipidemia   . Hypertension   . Myocardial infarction ([place])   . Rosacea     Tobacco Use: Social History   Tobacco Use  Smoking Status Former Smoker  . Last attempt to quit: 05/13/1986  . Years since quitting: 31.4  Smokeless Tobacco Never Used    Labs: Recent Chemical engineer    Labs for ITP Cardiac and Pulmonary Rehab Latest Ref Rng & Units 06/07/2017 06/07/2017 06/08/2017 06/09/2017 09/26/2017   Cholestrol 100 - 199 mg/dL - - - - 134   LDLCALC 0 - 99 mg/dL - - - - 49   HDL >39 mg/dL - - - - 70   Trlycerides 0 - 149  mg/dL - - - - 73   Hemoglobin A1c 4.8 - 5.6 % - - - - -   PHART 7.350 - 7.450 7.298(L) - - - -   PCO2ART 32.0 - 48.0 mmHg 45.7 - - - -   HCO3 20.0 - 28.0 mmol/L 22.4 - - - -   TCO2 22 - 32 mmol/L 24 23 25 26  -   ACIDBASEDEF 0.0 - 2.0 mmol/L 4.0(H) - - - -   O2SAT % 95.0 - - 64.5 -      Capillary Blood Glucose: Lab Results  Component Value Date   GLUCAP 102 (H) 06/11/2017   GLUCAP 93 06/11/2017   GLUCAP 83 06/11/2017   GLUCAP 78 06/10/2017   GLUCAP 94 06/10/2017     Exercise Target Goals: Date: 09/29/17  Exercise Program Goal: Individual exercise prescription set using results from initial 6 min walk test and THRR while considering  patient's activity barriers and safety.   Exercise Prescription Goal: Initial exercise prescription builds to 30-45 minutes a day of aerobic activity, 2-3 days per week.  Home exercise guidelines will be given to patient during program as part of exercise prescription that the participant will acknowledge.  Activity Barriers & Risk Stratification: Activity Barriers & Cardiac Risk Stratification -  09/29/17 4008      Activity Barriers & Cardiac Risk Stratification   Activity Barriers  Deconditioning;Muscular Weakness;Other (comment);Arthritis    Comments  L RTC repair, R shoulder pain and limitations    Cardiac Risk Stratification  High       6 Minute Walk: 6 Minute Walk    Row Name 09/29/17 1144         6 Minute Walk   Phase  Initial     Distance  1779 feet     Walk Time  6 minutes     # of Rest Breaks  0     MPH  3.4     METS  3.4     RPE  11     VO2 Peak  11.96     Symptoms  No     Resting HR  60 bpm     Resting BP  118/60     Resting Oxygen Saturation   95 %     Exercise Oxygen Saturation  during 6 min walk  96 %     Max Ex. HR  83 bpm     Max Ex. BP  120/80     2 Minute Post BP  126/68        Oxygen Initial Assessment:   Oxygen Re-Evaluation:   Oxygen Discharge (Final Oxygen Re-Evaluation):   Initial Exercise  Prescription: Initial Exercise Prescription - 09/29/17 1100      Date of Initial Exercise RX and Referring Provider   Date  09/29/17    Referring Provider  Minus Breeding MD      Treadmill   MPH  3    Grade  0    Minutes  10    METs  3.3      Bike   Level  1    Minutes  10    METs  3.4      NuStep   Level  3    SPM  80    Minutes  10    METs  3      Prescription Details   Frequency (times per week)  3    Duration  Progress to 30 minutes of continuous aerobic without signs/symptoms of physical distress      Intensity   THRR 40-80% of Max Heartrate  58-116    Ratings of Perceived Exertion  11-13    Perceived Dyspnea  0-4      Progression   Progression  Continue to progress workloads to maintain intensity without signs/symptoms of physical distress.      Resistance Training   Training Prescription  Yes    Weight  3lbs    Reps  10-15       Perform Capillary Blood Glucose checks as needed.  Exercise Prescription Changes:   Exercise Comments:   Exercise Goals and Review: Exercise Goals    Row Name 09/29/17 0918             Exercise Goals   Increase Physical Activity  Yes       Intervention  Provide advice, education, support and counseling about physical activity/exercise needs.;Develop an individualized exercise prescription for aerobic and resistive training based on initial evaluation findings, risk stratification, comorbidities and participant's personal goals.       Expected Outcomes  Short Term: Attend rehab on a regular basis to increase amount of physical activity.;Long Term: Exercising regularly at least 3-5 days a week.;Long Term: Add in home exercise to make exercise part  of routine and to increase amount of physical activity.       Increase Strength and Stamina  Yes       Intervention  Provide advice, education, support and counseling about physical activity/exercise needs.;Develop an individualized exercise prescription for aerobic and resistive  training based on initial evaluation findings, risk stratification, comorbidities and participant's personal goals.       Expected Outcomes  Short Term: Increase workloads from initial exercise prescription for resistance, speed, and METs.;Short Term: Perform resistance training exercises routinely during rehab and add in resistance training at home;Long Term: Improve cardiorespiratory fitness, muscular endurance and strength as measured by increased METs and functional capacity (6MWT)       Able to understand and use rate of perceived exertion (RPE) scale  Yes       Intervention  Provide education and explanation on how to use RPE scale       Expected Outcomes  Short Term: Able to use RPE daily in rehab to express subjective intensity level;Long Term:  Able to use RPE to guide intensity level when exercising independently       Knowledge and understanding of Target Heart Rate Range (THRR)  Yes       Intervention  Provide education and explanation of THRR including how the numbers were predicted and where they are located for reference       Expected Outcomes  Short Term: Able to state/look up THRR;Long Term: Able to use THRR to govern intensity when exercising independently;Short Term: Able to use daily as guideline for intensity in rehab       Able to check pulse independently  Yes       Intervention  Provide education and demonstration on how to check pulse in carotid and radial arteries.;Review the importance of being able to check your own pulse for safety during independent exercise       Expected Outcomes  Short Term: Able to explain why pulse checking is important during independent exercise;Long Term: Able to check pulse independently and accurately       Understanding of Exercise Prescription  Yes       Intervention  Provide education, explanation, and written materials on patient's individual exercise prescription       Expected Outcomes  Short Term: Able to explain program exercise  prescription;Long Term: Able to explain home exercise prescription to exercise independently          Exercise Goals Re-Evaluation :    Discharge Exercise Prescription (Final Exercise Prescription Changes):   Nutrition:  Target Goals: Understanding of nutrition guidelines, daily intake of sodium 1500mg , cholesterol 200mg , calories 30% from fat and 7% or less from saturated fats, daily to have 5 or more servings of fruits and vegetables.  Biometrics: Pre Biometrics - 09/29/17 1152      Pre Biometrics   Height  5\' 9"  (1.753 m)    Weight  169 lb 5 oz (76.8 kg)    Waist Circumference  39.25 inches    Hip Circumference  38.25 inches    Waist to Hip Ratio  1.03 %    BMI (Calculated)  24.99    Triceps Skinfold  23 mm    % Body Fat  28.2 %    Grip Strength  41 kg    Flexibility  0 in    Single Leg Stand  12.84 seconds        Nutrition Therapy Plan and Nutrition Goals: Nutrition Therapy & Goals - 09/29/17 1055  Nutrition Therapy   Diet  heart healthy      Intervention Plan   Intervention  Prescribe, educate and counsel regarding individualized specific dietary modifications aiming towards targeted core components such as weight, hypertension, lipid management, diabetes, heart failure and other comorbidities.    Expected Outcomes  Short Term Goal: Understand basic principles of dietary content, such as calories, fat, sodium, cholesterol and nutrients.;Long Term Goal: Adherence to prescribed nutrition plan.       Nutrition Assessments: Nutrition Assessments - 09/29/17 1056      MEDFICTS Scores   Pre Score  12       Nutrition Goals Re-Evaluation:   Nutrition Goals Re-Evaluation:   Nutrition Goals Discharge (Final Nutrition Goals Re-Evaluation):   Psychosocial: Target Goals: Acknowledge presence or absence of significant depression and/or stress, maximize coping skills, provide positive support system. Participant is able to verbalize types and ability to use  techniques and skills needed for reducing stress and depression.  Initial Review & Psychosocial Screening: Initial Psych Review & Screening - 09/29/17 1221      Initial Review   Current issues with  None Identified      Family Dynamics   Good Support System?  Yes Norwin reports his wife as a source of support.      Barriers   Psychosocial barriers to participate in program  There are no identifiable barriers or psychosocial needs.      Screening Interventions   Interventions  Encouraged to exercise       Quality of Life Scores: Quality of Life - 09/29/17 1157      Quality of Life Scores   Health/Function Pre  28.53 %    Socioeconomic Pre  30 %    Psych/Spiritual Pre  28.93 %    Family Pre  28.8 %    GLOBAL Pre  28.92 %      Scores of 19 and below usually indicate a poorer quality of life in these areas.  A difference of  2-3 points is a clinically meaningful difference.  A difference of 2-3 points in the total score of the Quality of Life Index has been associated with significant improvement in overall quality of life, self-image, physical symptoms, and general health in studies assessing change in quality of life.  PHQ-9: Recent Review Flowsheet Data    There is no flowsheet data to display.     Interpretation of Total Score  Total Score Depression Severity:  1-4 = Minimal depression, 5-9 = Mild depression, 10-14 = Moderate depression, 15-19 = Moderately severe depression, 20-27 = Severe depression   Psychosocial Evaluation and Intervention:   Psychosocial Re-Evaluation:   Psychosocial Discharge (Final Psychosocial Re-Evaluation):   Vocational Rehabilitation: Provide vocational rehab assistance to qualifying candidates.   Vocational Rehab Evaluation & Intervention: Vocational Rehab - 09/29/17 1221      Initial Vocational Rehab Evaluation & Intervention   Assessment shows need for Vocational Rehabilitation  No       Education: Education Goals: Education  classes will be provided on a weekly basis, covering required topics. Participant will state understanding/return demonstration of topics presented.  Learning Barriers/Preferences: Learning Barriers/Preferences - 09/29/17 9937      Learning Barriers/Preferences   Learning Barriers  Sight    Learning Preferences  Written Material;Pictoral;Video       Education Topics: Count Your Pulse:  -Group instruction provided by verbal instruction, demonstration, patient participation and written materials to support subject.  Instructors address importance of being able to find your pulse  and how to count your pulse when at home without a heart monitor.  Patients get hands on experience counting their pulse with staff help and individually.   Heart Attack, Angina, and Risk Factor Modification:  -Group instruction provided by verbal instruction, video, and written materials to support subject.  Instructors address signs and symptoms of angina and heart attacks.    Also discuss risk factors for heart disease and how to make changes to improve heart health risk factors.   Functional Fitness:  -Group instruction provided by verbal instruction, demonstration, patient participation, and written materials to support subject.  Instructors address safety measures for doing things around the house.  Discuss how to get up and down off the floor, how to pick things up properly, how to safely get out of a chair without assistance, and balance training.   Meditation and Mindfulness:  -Group instruction provided by verbal instruction, patient participation, and written materials to support subject.  Instructor addresses importance of mindfulness and meditation practice to help reduce stress and improve awareness.  Instructor also leads participants through a meditation exercise.    Stretching for Flexibility and Mobility:  -Group instruction provided by verbal instruction, patient participation, and written  materials to support subject.  Instructors lead participants through series of stretches that are designed to increase flexibility thus improving mobility.  These stretches are additional exercise for major muscle groups that are typically performed during regular warm up and cool down.   Hands Only CPR:  -Group verbal, video, and participation provides a basic overview of AHA guidelines for community CPR. Role-play of emergencies allow participants the opportunity to practice calling for help and chest compression technique with discussion of AED use.   Hypertension: -Group verbal and written instruction that provides a basic overview of hypertension including the most recent diagnostic guidelines, risk factor reduction with self-care instructions and medication management.    Nutrition I class: Heart Healthy Eating:  -Group instruction provided by PowerPoint slides, verbal discussion, and written materials to support subject matter. The instructor gives an explanation and review of the Therapeutic Lifestyle Changes diet recommendations, which includes a discussion on lipid goals, dietary fat, sodium, fiber, plant stanol/sterol esters, sugar, and the components of a well-balanced, healthy diet.   Nutrition II class: Lifestyle Skills:  -Group instruction provided by PowerPoint slides, verbal discussion, and written materials to support subject matter. The instructor gives an explanation and review of label reading, grocery shopping for heart health, heart healthy recipe modifications, and ways to make healthier choices when eating out.   Diabetes Question & Answer:  -Group instruction provided by PowerPoint slides, verbal discussion, and written materials to support subject matter. The instructor gives an explanation and review of diabetes co-morbidities, pre- and post-prandial blood glucose goals, pre-exercise blood glucose goals, signs, symptoms, and treatment of hypoglycemia and  hyperglycemia, and foot care basics.   Diabetes Blitz:  -Group instruction provided by PowerPoint slides, verbal discussion, and written materials to support subject matter. The instructor gives an explanation and review of the physiology behind type 1 and type 2 diabetes, diabetes medications and rational behind using different medications, pre- and post-prandial blood glucose recommendations and Hemoglobin A1c goals, diabetes diet, and exercise including blood glucose guidelines for exercising safely.    Portion Distortion:  -Group instruction provided by PowerPoint slides, verbal discussion, written materials, and food models to support subject matter. The instructor gives an explanation of serving size versus portion size, changes in portions sizes over the last 20 years, and  what consists of a serving from each food group.   Stress Management:  -Group instruction provided by verbal instruction, video, and written materials to support subject matter.  Instructors review role of stress in heart disease and how to cope with stress positively.     Exercising on Your Own:  -Group instruction provided by verbal instruction, power point, and written materials to support subject.  Instructors discuss benefits of exercise, components of exercise, frequency and intensity of exercise, and end points for exercise.  Also discuss use of nitroglycerin and activating EMS.  Review options of places to exercise outside of rehab.  Review guidelines for sex with heart disease.   Cardiac Drugs I:  -Group instruction provided by verbal instruction and written materials to support subject.  Instructor reviews cardiac drug classes: antiplatelets, anticoagulants, beta blockers, and statins.  Instructor discusses reasons, side effects, and lifestyle considerations for each drug class.   Cardiac Drugs II:  -Group instruction provided by verbal instruction and written materials to support subject.  Instructor  reviews cardiac drug classes: angiotensin converting enzyme inhibitors (ACE-I), angiotensin II receptor blockers (ARBs), nitrates, and calcium channel blockers.  Instructor discusses reasons, side effects, and lifestyle considerations for each drug class.   Anatomy and Physiology of the Circulatory System:  Group verbal and written instruction and models provide basic cardiac anatomy and physiology, with the coronary electrical and arterial systems. Review of: AMI, Angina, Valve disease, Heart Failure, Peripheral Artery Disease, Cardiac Arrhythmia, Pacemakers, and the ICD.   Other Education:  -Group or individual verbal, written, or video instructions that support the educational goals of the cardiac rehab program.   Holiday Eating Survival Tips:  -Group instruction provided by PowerPoint slides, verbal discussion, and written materials to support subject matter. The instructor gives patients tips, tricks, and techniques to help them not only survive but enjoy the holidays despite the onslaught of food that accompanies the holidays.   Knowledge Questionnaire Score: Knowledge Questionnaire Score - 09/29/17 1143      Knowledge Questionnaire Score   Pre Score  19/24       Core Components/Risk Factors/Patient Goals at Admission: Personal Goals and Risk Factors at Admission - 09/29/17 1152      Core Components/Risk Factors/Patient Goals on Admission    Weight Management  Yes;Weight Maintenance    Intervention  Weight Management: Develop a combined nutrition and exercise program designed to reach desired caloric intake, while maintaining appropriate intake of nutrient and fiber, sodium and fats, and appropriate energy expenditure required for the weight goal.;Weight Management: Provide education and appropriate resources to help participant work on and attain dietary goals.;Weight Management/Obesity: Establish reasonable short term and long term weight goals.    Admit Weight  169 lb 5 oz (76.8  kg)    Goal Weight: Short Term  169 lb (76.7 kg)    Goal Weight: Long Term  169 lb (76.7 kg)    Expected Outcomes  Short Term: Continue to assess and modify interventions until short term weight is achieved;Long Term: Adherence to nutrition and physical activity/exercise program aimed toward attainment of established weight goal;Weight Maintenance: Understanding of the daily nutrition guidelines, which includes 25-35% calories from fat, 7% or less cal from saturated fats, less than 200mg  cholesterol, less than 1.5gm of sodium, & 5 or more servings of fruits and vegetables daily;Understanding recommendations for meals to include 15-35% energy as protein, 25-35% energy from fat, 35-60% energy from carbohydrates, less than 200mg  of dietary cholesterol, 20-35 gm of total fiber daily;Understanding of  distribution of calorie intake throughout the day with the consumption of 4-5 meals/snacks    Hypertension  Yes    Intervention  Provide education on lifestyle modifcations including regular physical activity/exercise, weight management, moderate sodium restriction and increased consumption of fresh fruit, vegetables, and low fat dairy, alcohol moderation, and smoking cessation.;Monitor prescription use compliance.    Expected Outcomes  Short Term: Continued assessment and intervention until BP is < 140/14mm HG in hypertensive participants. < 130/40mm HG in hypertensive participants with diabetes, heart failure or chronic kidney disease.;Long Term: Maintenance of blood pressure at goal levels.    Lipids  Yes    Intervention  Provide education and support for participant on nutrition & aerobic/resistive exercise along with prescribed medications to achieve LDL 70mg , HDL >40mg .    Expected Outcomes  Short Term: Participant states understanding of desired cholesterol values and is compliant with medications prescribed. Participant is following exercise prescription and nutrition guidelines.;Long Term: Cholesterol  controlled with medications as prescribed, with individualized exercise RX and with personalized nutrition plan. Value goals: LDL < 70mg , HDL > 40 mg.       Core Components/Risk Factors/Patient Goals Review:    Core Components/Risk Factors/Patient Goals at Discharge (Final Review):    ITP Comments: ITP Comments    Row Name 09/27/17 1432           ITP Comments  Dr. Fransico Him, Medical Director          Comments: Patient attended orientation from [phone] to 0957 to review rules and guidelines for program. Completed 6 minute walk test, Intitial ITP, and exercise prescription.  VSS. Telemetry-SB/SR.  Asymptomatic.

## 2017-09-30 ENCOUNTER — Encounter (HOSPITAL_COMMUNITY): Payer: Medicare Other

## 2017-10-03 ENCOUNTER — Encounter (HOSPITAL_COMMUNITY): Payer: Medicare Other

## 2017-10-05 ENCOUNTER — Encounter (HOSPITAL_COMMUNITY): Payer: Medicare Other

## 2017-10-05 ENCOUNTER — Encounter (HOSPITAL_COMMUNITY)
Admission: RE | Admit: 2017-10-05 | Discharge: 2017-10-05 | Disposition: A | Discharge status: Home / Self Care | Payer: Medicare Other | Source: Ambulatory Visit | Attending: Cardiology | Admitting: Cardiology

## 2017-10-05 DIAGNOSIS — Z951 Presence of aortocoronary bypass graft: Secondary | ICD-10-CM | POA: Diagnosis not present

## 2017-10-05 DIAGNOSIS — Z79899 Other long term (current) drug therapy: Secondary | ICD-10-CM | POA: Diagnosis not present

## 2017-10-05 DIAGNOSIS — I10 Essential (primary) hypertension: Secondary | ICD-10-CM | POA: Diagnosis not present

## 2017-10-05 DIAGNOSIS — Z7982 Long term (current) use of aspirin: Secondary | ICD-10-CM | POA: Diagnosis not present

## 2017-10-05 DIAGNOSIS — Z7901 Long term (current) use of anticoagulants: Secondary | ICD-10-CM | POA: Diagnosis not present

## 2017-10-05 DIAGNOSIS — M1990 Unspecified osteoarthritis, unspecified site: Secondary | ICD-10-CM | POA: Diagnosis not present

## 2017-10-05 NOTE — Progress Notes (Signed)
Daily Session Note  Patient Details  Name: Wayland Baik MRN: [id number] Date of Birth: 1940/07/22 Referring Provider:     CARDIAC REHAB PHASE II ORIENTATION from 09/29/2017 in [place]  Referring Provider  Minus Breeding MD      Encounter Date: 10/05/2017  Check In: Session Check In - 10/05/17 1244      Check-In   Location  [facility]    Staff Present  Jiles Garter, RN BSN;Amber Fair, MS, ACSM RCEP, Exercise Physiologist;Tyara Carol Ada, MS,ACSM CEP, Exercise Physiologist;Siobhan Zaro Venetia Maxon, RN, BSN    Supervising physician immediately available to respond to emergencies  [facility] immediately available    Physician(s)  Dr Bonner Puna    Medication changes reported      No    Fall or balance concerns reported     No    Tobacco Cessation  No Change    Warm-up and Cool-down  Not performed (comment)    Resistance Training Performed  No    VAD Patient?  No    PAD/SET Patient?  No      Pain Assessment   Currently in Pain?  No/denies       Capillary Blood Glucose: No results found for this or any previous visit (from the past 24 hour(s)).  Exercise Prescription Changes - 10/05/17 0955      Response to Exercise   Blood Pressure (Admit)  112/60    Blood Pressure (Exercise)  142/84    Blood Pressure (Exit)  118/62    Heart Rate (Admit)  52 bpm    Heart Rate (Exercise)  98 bpm    Heart Rate (Exit)  60 bpm    Rating of Perceived Exertion (Exercise)  12    Symptoms  none    Duration  Progress to 30 minutes of  aerobic without signs/symptoms of physical distress    Intensity  THRR unchanged      Progression   Progression  Continue to progress workloads to maintain intensity without signs/symptoms of physical distress.    Average METs  3.1      Resistance Training   Training Prescription  No Relaxation day, no weights today.      Interval Training   Interval Training  No      Treadmill   MPH  3    Grade  0    Minutes  10     METs  3.3      Bike   Level  1    Minutes  10    METs  3.5      NuStep   Level  3    SPM  80    Minutes  10    METs  2.4       Social History   Tobacco Use  Smoking Status Former Smoker  . Last attempt to quit: 05/13/1986  . Years since quitting: 31.4  Smokeless Tobacco Never Used    Goals Met:  Exercise tolerated well  Goals Unmet:  Not Applicable  Comments: Pt started cardiac rehab today.  Pt tolerated light exercise without difficulty. VSS, telemetry-Sinus Rhythm, asymptomatic.  Medication list reconciled. Pt denies barriers to medicaiton compliance.  PSYCHOSOCIAL ASSESSMENT:  PHQ-0. Pt exhibits positive coping skills, hopeful outlook with supportive family. No psychosocial needs identified at this time, no psychosocial interventions necessary.    Pt enjoys playing musical instruments.   Pt oriented to exercise equipment and routine.    Understanding verbalized.   Dr. Tressia Miners  Turner is Market researcher for Cardiac Rehab at [facility].

## 2017-10-07 ENCOUNTER — Encounter (HOSPITAL_COMMUNITY): Payer: Medicare Other

## 2017-10-07 ENCOUNTER — Encounter (HOSPITAL_COMMUNITY)
Admission: RE | Admit: 2017-10-07 | Discharge: 2017-10-07 | Disposition: A | Discharge status: Home / Self Care | Payer: Medicare Other | Source: Ambulatory Visit | Attending: Cardiology | Admitting: Cardiology

## 2017-10-07 DIAGNOSIS — Z7982 Long term (current) use of aspirin: Secondary | ICD-10-CM | POA: Diagnosis not present

## 2017-10-07 DIAGNOSIS — M1990 Unspecified osteoarthritis, unspecified site: Secondary | ICD-10-CM | POA: Diagnosis not present

## 2017-10-07 DIAGNOSIS — Z951 Presence of aortocoronary bypass graft: Secondary | ICD-10-CM | POA: Diagnosis not present

## 2017-10-07 DIAGNOSIS — Z7901 Long term (current) use of anticoagulants: Secondary | ICD-10-CM | POA: Diagnosis not present

## 2017-10-07 DIAGNOSIS — Z79899 Other long term (current) drug therapy: Secondary | ICD-10-CM | POA: Diagnosis not present

## 2017-10-07 DIAGNOSIS — I10 Essential (primary) hypertension: Secondary | ICD-10-CM | POA: Diagnosis not present

## 2017-10-10 ENCOUNTER — Encounter (HOSPITAL_COMMUNITY): Payer: Medicare Other

## 2017-10-10 ENCOUNTER — Encounter (HOSPITAL_COMMUNITY)
Admission: RE | Admit: 2017-10-10 | Discharge: 2017-10-10 | Disposition: A | Discharge status: Home / Self Care | Payer: Medicare Other | Source: Ambulatory Visit | Attending: Cardiology | Admitting: Cardiology

## 2017-10-10 DIAGNOSIS — M1990 Unspecified osteoarthritis, unspecified site: Secondary | ICD-10-CM | POA: Insufficient documentation

## 2017-10-10 DIAGNOSIS — Z79899 Other long term (current) drug therapy: Secondary | ICD-10-CM | POA: Insufficient documentation

## 2017-10-10 DIAGNOSIS — I219 Acute myocardial infarction, unspecified: Secondary | ICD-10-CM | POA: Diagnosis not present

## 2017-10-10 DIAGNOSIS — Z7982 Long term (current) use of aspirin: Secondary | ICD-10-CM | POA: Diagnosis not present

## 2017-10-10 DIAGNOSIS — I10 Essential (primary) hypertension: Secondary | ICD-10-CM | POA: Diagnosis not present

## 2017-10-10 DIAGNOSIS — Z7901 Long term (current) use of anticoagulants: Secondary | ICD-10-CM | POA: Insufficient documentation

## 2017-10-10 DIAGNOSIS — E785 Hyperlipidemia, unspecified: Secondary | ICD-10-CM | POA: Insufficient documentation

## 2017-10-10 DIAGNOSIS — Z951 Presence of aortocoronary bypass graft: Secondary | ICD-10-CM | POA: Insufficient documentation

## 2017-10-10 DIAGNOSIS — Z87891 Personal history of nicotine dependence: Secondary | ICD-10-CM | POA: Diagnosis not present

## 2017-10-12 ENCOUNTER — Encounter (HOSPITAL_COMMUNITY)
Admission: RE | Admit: 2017-10-12 | Discharge: 2017-10-12 | Disposition: A | Discharge status: Home / Self Care | Payer: Medicare Other | Source: Ambulatory Visit | Attending: Cardiology | Admitting: Cardiology

## 2017-10-12 ENCOUNTER — Encounter (HOSPITAL_COMMUNITY): Payer: Medicare Other

## 2017-10-12 ENCOUNTER — Telehealth: Payer: Self-pay | Admitting: Cardiology

## 2017-10-12 DIAGNOSIS — I10 Essential (primary) hypertension: Secondary | ICD-10-CM | POA: Diagnosis not present

## 2017-10-12 DIAGNOSIS — M1990 Unspecified osteoarthritis, unspecified site: Secondary | ICD-10-CM | POA: Diagnosis not present

## 2017-10-12 DIAGNOSIS — Z951 Presence of aortocoronary bypass graft: Secondary | ICD-10-CM | POA: Diagnosis not present

## 2017-10-12 DIAGNOSIS — Z7982 Long term (current) use of aspirin: Secondary | ICD-10-CM | POA: Diagnosis not present

## 2017-10-12 DIAGNOSIS — Z79899 Other long term (current) drug therapy: Secondary | ICD-10-CM | POA: Diagnosis not present

## 2017-10-12 DIAGNOSIS — Z7901 Long term (current) use of anticoagulants: Secondary | ICD-10-CM | POA: Diagnosis not present

## 2017-10-12 NOTE — Progress Notes (Signed)
Reviewed home exercise guidelines with patient including endpoints, temperature precautions, target heart rate and rate of perceived exertion. Pt is walking 15-20 minutes outdoors or on treadmill at home, 3-4 days/week as his mode of home exercise. Pt has a total gym at home, which he plans to use pending clearance from his physician since it is a body weight exercise. Pt voices understanding of instructions given. Sol Passer, MS, ACSM CEP

## 2017-10-12 NOTE — Telephone Encounter (Signed)
New message    Patient calling to inquire about using "Total Gym" for exercise. Cardiac Rehab advised him to confirm usage

## 2017-10-12 NOTE — Telephone Encounter (Signed)
Good option  Glenetta Hew, MD For Dr. Percival Spanish

## 2017-10-14 ENCOUNTER — Encounter (HOSPITAL_COMMUNITY)
Admission: RE | Admit: 2017-10-14 | Discharge: 2017-10-14 | Disposition: A | Discharge status: Home / Self Care | Payer: Medicare Other | Source: Ambulatory Visit | Attending: Cardiology | Admitting: Cardiology

## 2017-10-14 ENCOUNTER — Encounter (HOSPITAL_COMMUNITY): Payer: Medicare Other

## 2017-10-14 DIAGNOSIS — Z951 Presence of aortocoronary bypass graft: Secondary | ICD-10-CM | POA: Diagnosis not present

## 2017-10-14 DIAGNOSIS — Z7982 Long term (current) use of aspirin: Secondary | ICD-10-CM | POA: Diagnosis not present

## 2017-10-14 DIAGNOSIS — I10 Essential (primary) hypertension: Secondary | ICD-10-CM | POA: Diagnosis not present

## 2017-10-14 DIAGNOSIS — M1990 Unspecified osteoarthritis, unspecified site: Secondary | ICD-10-CM | POA: Diagnosis not present

## 2017-10-14 DIAGNOSIS — Z79899 Other long term (current) drug therapy: Secondary | ICD-10-CM | POA: Diagnosis not present

## 2017-10-14 DIAGNOSIS — Z7901 Long term (current) use of anticoagulants: Secondary | ICD-10-CM | POA: Diagnosis not present

## 2017-10-14 NOTE — Telephone Encounter (Signed)
Patient aware of MD advice.  ?

## 2017-10-17 ENCOUNTER — Encounter (HOSPITAL_COMMUNITY)
Admission: RE | Admit: 2017-10-17 | Discharge: 2017-10-17 | Disposition: A | Discharge status: Home / Self Care | Payer: Medicare Other | Source: Ambulatory Visit | Attending: Cardiology | Admitting: Cardiology

## 2017-10-17 ENCOUNTER — Encounter (HOSPITAL_COMMUNITY): Payer: Medicare Other

## 2017-10-17 VITALS — Ht 69.0 in | Wt 167.1 lb

## 2017-10-17 DIAGNOSIS — Z79899 Other long term (current) drug therapy: Secondary | ICD-10-CM | POA: Diagnosis not present

## 2017-10-17 DIAGNOSIS — Z7982 Long term (current) use of aspirin: Secondary | ICD-10-CM | POA: Diagnosis not present

## 2017-10-17 DIAGNOSIS — Z951 Presence of aortocoronary bypass graft: Secondary | ICD-10-CM | POA: Diagnosis not present

## 2017-10-17 DIAGNOSIS — M1990 Unspecified osteoarthritis, unspecified site: Secondary | ICD-10-CM | POA: Diagnosis not present

## 2017-10-17 DIAGNOSIS — Z7901 Long term (current) use of anticoagulants: Secondary | ICD-10-CM | POA: Diagnosis not present

## 2017-10-17 DIAGNOSIS — I10 Essential (primary) hypertension: Secondary | ICD-10-CM | POA: Diagnosis not present

## 2017-10-17 NOTE — Progress Notes (Signed)
Patrick Nguyen 77 y.o. male Nutrition Note Spoke with pt. Nutrition Plan and Nutrition Survey goals reviewed with pt. Pt is following a Heart Healthy diet, shared he prefers to follow mediterranean style of eating. Pt shared he does not salt or add sugars to his food. As pts last A1c was elevated 5.8 discussed consistent carbohydrates across the day and discussed aiming for complex carbohydrates over refined grains and sugars. Pt shared largest barrier is how food tastes with less salt and sugar, discussed how to make spice blends without added salt and sugar, and discussed stores he can buy spices at in the area. Pt expressed understanding of the information reviewed. Pt aware of nutrition education classes offered, plans to attend.   Lab Results  Component Value Date   HGBA1C 5.8 (H) 06/03/2017    Wt Readings from Last 3 Encounters:  10/17/17 167 lb 1.7 oz (75.8 kg)  09/29/17 169 lb 5 oz (76.8 kg)  09/23/17 169 lb 9.6 oz (76.9 kg)    Nutrition Diagnosis  Food-and nutrition-related knowledge deficit related to lack of exposure to information as related to diagnosis of: ? CVD ?   Nutrition Intervention ? Pt's individual nutrition plan reviewed with pt. ? Benefits of adopting Heart Healthy diet discussed when Medficts reviewed.    Goal(s)  ? Pt to identify and limit food sources of saturated fat, trans fat, and sodium  Plan:  Pt to attend nutrition classes ? Nutrition I ? Nutrition II ? Portion Distortion  Will provide client-centered nutrition education as part of interdisciplinary care.   Monitor and evaluate progress toward nutrition goal with team.   Laurina Bustle, MS, RD, LDN 10/17/2017 10:37 AM

## 2017-10-19 ENCOUNTER — Encounter (HOSPITAL_COMMUNITY)
Admission: RE | Admit: 2017-10-19 | Discharge: 2017-10-19 | Disposition: A | Discharge status: Home / Self Care | Payer: Medicare Other | Source: Ambulatory Visit | Attending: Cardiology | Admitting: Cardiology

## 2017-10-19 ENCOUNTER — Encounter (HOSPITAL_COMMUNITY): Payer: Medicare Other

## 2017-10-19 DIAGNOSIS — M1990 Unspecified osteoarthritis, unspecified site: Secondary | ICD-10-CM | POA: Diagnosis not present

## 2017-10-19 DIAGNOSIS — Z7982 Long term (current) use of aspirin: Secondary | ICD-10-CM | POA: Diagnosis not present

## 2017-10-19 DIAGNOSIS — Z7901 Long term (current) use of anticoagulants: Secondary | ICD-10-CM | POA: Diagnosis not present

## 2017-10-19 DIAGNOSIS — Z951 Presence of aortocoronary bypass graft: Secondary | ICD-10-CM

## 2017-10-19 DIAGNOSIS — I10 Essential (primary) hypertension: Secondary | ICD-10-CM | POA: Diagnosis not present

## 2017-10-19 DIAGNOSIS — Z79899 Other long term (current) drug therapy: Secondary | ICD-10-CM | POA: Diagnosis not present

## 2017-10-20 NOTE — Progress Notes (Signed)
Cardiac Individual Treatment Plan  Patient Details  Name: Patrick Nguyen MRN: [id number] Date of Birth: Sep 05, 1940 Referring Provider:     CARDIAC REHAB PHASE II ORIENTATION from 09/29/2017 in [place]  Referring Provider  Minus Breeding MD      Initial Encounter Date:    CARDIAC REHAB PHASE II ORIENTATION from 09/29/2017 in [place]  Date  09/29/17      Visit Diagnosis: S/P CABG (coronary artery bypass graft)  Patient's Home Medications on Admission:  Current Outpatient Medications:  .  aspirin EC 81 MG tablet, Take 81 mg by mouth daily., Disp: , Rfl:  .  lisinopril (PRINIVIL,ZESTRIL) 2.5 MG tablet, Take 2.5 mg by mouth daily., Disp: , Rfl:  .  metoprolol tartrate (LOPRESSOR) 25 MG tablet, Take 1 tablet (25 mg total) by mouth 2 (two) times daily., Disp: 60 tablet, Rfl: 3 .  rosuvastatin (CRESTOR) 20 MG tablet, Take 1 tablet (20 mg total) by mouth every evening., Disp: 30 tablet, Rfl: 2  Past Medical History: Past Medical History:  Diagnosis Date  . Anginal pain ([place]) 05/13/2017  . Arthritis   . CAD (coronary artery disease) 05/13/2017   s/p CABG w/ / LIMA-LAD, SVG-OM, SVG-PDA  . Dyspnea    w/ exertion   . Elevated coronary artery calcium score   . GERD (gastroesophageal reflux disease)    hx  . Hepatitis    in   high  school    . Hyperlipidemia   . Hypertension   . Myocardial infarction ([place])   . Rosacea     Tobacco Use: Social History   Tobacco Use  Smoking Status Former Smoker  . Last attempt to quit: 05/13/1986  . Years since quitting: 31.4  Smokeless Tobacco Never Used    Labs: Recent Chemical engineer    Labs for ITP Cardiac and Pulmonary Rehab Latest Ref Rng & Units 06/07/2017 06/07/2017 06/08/2017 06/09/2017 09/26/2017   Cholestrol 100 - 199 mg/dL - - - - 134   LDLCALC 0 - 99 mg/dL - - - - 49   HDL >39 mg/dL - - - - 70   Trlycerides 0 - 149 mg/dL - - - - 73   Hemoglobin A1c 4.8 - 5.6  % - - - - -   PHART 7.350 - 7.450 7.298(L) - - - -   PCO2ART 32.0 - 48.0 mmHg 45.7 - - - -   HCO3 20.0 - 28.0 mmol/L 22.4 - - - -   TCO2 22 - 32 mmol/L 24 23 25 26  -   ACIDBASEDEF 0.0 - 2.0 mmol/L 4.0(H) - - - -   O2SAT % 95.0 - - 64.5 -      Capillary Blood Glucose: Lab Results  Component Value Date   GLUCAP 102 (H) 06/11/2017   GLUCAP 93 06/11/2017   GLUCAP 83 06/11/2017   GLUCAP 78 06/10/2017   GLUCAP 94 06/10/2017     Exercise Target Goals:    Exercise Program Goal: Individual exercise prescription set using results from initial 6 min walk test and THRR while considering  patient's activity barriers and safety.   Exercise Prescription Goal: Initial exercise prescription builds to 30-45 minutes a day of aerobic activity, 2-3 days per week.  Home exercise guidelines will be given to patient during program as part of exercise prescription that the participant will acknowledge.  Activity Barriers & Risk Stratification: Activity Barriers & Cardiac Risk Stratification - 09/29/17 0865  Activity Barriers & Cardiac Risk Stratification   Activity Barriers  Deconditioning;Muscular Weakness;Other (comment);Arthritis    Comments  L RTC repair, R shoulder pain and limitations    Cardiac Risk Stratification  High       6 Minute Walk: 6 Minute Walk    Row Name 09/29/17 1144         6 Minute Walk   Phase  Initial     Distance  1779 feet     Walk Time  6 minutes     # of Rest Breaks  0     MPH  3.4     METS  3.4     RPE  11     VO2 Peak  11.96     Symptoms  No     Resting HR  60 bpm     Resting BP  118/60     Resting Oxygen Saturation   95 %     Exercise Oxygen Saturation  during 6 min walk  96 %     Max Ex. HR  83 bpm     Max Ex. BP  120/80     2 Minute Post BP  126/68        Oxygen Initial Assessment:   Oxygen Re-Evaluation:   Oxygen Discharge (Final Oxygen Re-Evaluation):   Initial Exercise Prescription: Initial Exercise Prescription - 09/29/17  1100      Date of Initial Exercise RX and Referring Provider   Date  09/29/17    Referring Provider  Minus Breeding MD      Treadmill   MPH  3    Grade  0    Minutes  10    METs  3.3      Bike   Level  1    Minutes  10    METs  3.4      NuStep   Level  3    SPM  80    Minutes  10    METs  3      Prescription Details   Frequency (times per week)  3    Duration  Progress to 30 minutes of continuous aerobic without signs/symptoms of physical distress      Intensity   THRR 40-80% of Max Heartrate  58-116    Ratings of Perceived Exertion  11-13    Perceived Dyspnea  0-4      Progression   Progression  Continue to progress workloads to maintain intensity without signs/symptoms of physical distress.      Resistance Training   Training Prescription  Yes    Weight  3lbs    Reps  10-15       Perform Capillary Blood Glucose checks as needed.  Exercise Prescription Changes: Exercise Prescription Changes    Row Name 10/05/17 0955 10/10/17 0948           Response to Exercise   Blood Pressure (Admit)  112/60  120/70      Blood Pressure (Exercise)  142/84  128/70      Blood Pressure (Exit)  118/62  134/68      Heart Rate (Admit)  52 bpm  60 bpm      Heart Rate (Exercise)  98 bpm  105 bpm      Heart Rate (Exit)  60 bpm  58 bpm      Rating of Perceived Exertion (Exercise)  12  13      Symptoms  none  none  Duration  Progress to 30 minutes of  aerobic without signs/symptoms of physical distress  Progress to 30 minutes of  aerobic without signs/symptoms of physical distress      Intensity  THRR unchanged  THRR unchanged        Progression   Progression  Continue to progress workloads to maintain intensity without signs/symptoms of physical distress.  Continue to progress workloads to maintain intensity without signs/symptoms of physical distress.      Average METs  3.1  4        Resistance Training   Training Prescription  No Relaxation day, no weights today.  Yes       Weight  -  3lbs      Reps  -  10-15      Time  -  10 Minutes        Interval Training   Interval Training  No  No        Treadmill   MPH  3  3      Grade  0  3      Minutes  10  10      METs  3.3  4.54        Bike   Level  1  1.4      Minutes  10  10      METs  3.5  4.51        NuStep   Level  3  3      SPM  80  80      Minutes  10  10      METs  2.4  3         Exercise Comments: Exercise Comments    Row Name 10/05/17 1045 10/10/17 1049 10/12/17 1013       Exercise Comments  Patient tolerated exercise well without c/o.  Reviewed METs and goals with patient.   Reviewed home exercise guidelines with patient.         Exercise Goals and Review: Exercise Goals    Row Name 09/29/17 0918             Exercise Goals   Increase Physical Activity  Yes       Intervention  Provide advice, education, support and counseling about physical activity/exercise needs.;Develop an individualized exercise prescription for aerobic and resistive training based on initial evaluation findings, risk stratification, comorbidities and participant's personal goals.       Expected Outcomes  Short Term: Attend rehab on a regular basis to increase amount of physical activity.;Long Term: Exercising regularly at least 3-5 days a week.;Long Term: Add in home exercise to make exercise part of routine and to increase amount of physical activity.       Increase Strength and Stamina  Yes       Intervention  Provide advice, education, support and counseling about physical activity/exercise needs.;Develop an individualized exercise prescription for aerobic and resistive training based on initial evaluation findings, risk stratification, comorbidities and participant's personal goals.       Expected Outcomes  Short Term: Increase workloads from initial exercise prescription for resistance, speed, and METs.;Short Term: Perform resistance training exercises routinely during rehab and add in resistance  training at home;Long Term: Improve cardiorespiratory fitness, muscular endurance and strength as measured by increased METs and functional capacity (6MWT)       Able to understand and use rate of perceived exertion (RPE) scale  Yes       Intervention  Provide education and explanation on how to use RPE scale       Expected Outcomes  Short Term: Able to use RPE daily in rehab to express subjective intensity level;Long Term:  Able to use RPE to guide intensity level when exercising independently       Knowledge and understanding of Target Heart Rate Range (THRR)  Yes       Intervention  Provide education and explanation of THRR including how the numbers were predicted and where they are located for reference       Expected Outcomes  Short Term: Able to state/look up THRR;Long Term: Able to use THRR to govern intensity when exercising independently;Short Term: Able to use daily as guideline for intensity in rehab       Able to check pulse independently  Yes       Intervention  Provide education and demonstration on how to check pulse in carotid and radial arteries.;Review the importance of being able to check your own pulse for safety during independent exercise       Expected Outcomes  Short Term: Able to explain why pulse checking is important during independent exercise;Long Term: Able to check pulse independently and accurately       Understanding of Exercise Prescription  Yes       Intervention  Provide education, explanation, and written materials on patient's individual exercise prescription       Expected Outcomes  Short Term: Able to explain program exercise prescription;Long Term: Able to explain home exercise prescription to exercise independently          Exercise Goals Re-Evaluation : Exercise Goals Re-Evaluation    Row Name 10/05/17 1045 10/10/17 1049 10/12/17 1013         Exercise Goal Re-Evaluation   Exercise Goals Review  Able to understand and use rate of perceived exertion  (RPE) scale  Able to understand and use rate of perceived exertion (RPE) scale;Increase Physical Activity  Able to understand and use rate of perceived exertion (RPE) scale;Understanding of Exercise Prescription;Knowledge and understanding of Target Heart Rate Range (THRR);Able to check pulse independently     Comments  Patient able to understand and use RPE scale appropriately.  Patient is walking 20-25 minutes, 4 days/week in addition to his exercise at [facility].   Reviewed home exercise guidelines with patient including THRR, RPE scale, and endpoints for exercise. Instructed patient on how to count pulse and pt was able to accurately obtain pulse. Pt is walking and has a treadmill at home. Pt also has a total gym at home, which he plans to use pending clearance from physician.     Expected Outcomes  Patient will exercise at least 30 minutes, 5-7 days/week to help achieve health and fitness goals.  Patient will increase walking duration at home from 20-25 minutes to 30 minutes 4 days/week to help improve stamina and cardiorespiratory fitness.  Patient will walk outdoors or on treadmill at home 3-4 days/week increasing duration to 30 minutes.         Discharge Exercise Prescription (Final Exercise Prescription Changes): Exercise Prescription Changes - 10/10/17 0948      Response to Exercise   Blood Pressure (Admit)  120/70    Blood Pressure (Exercise)  128/70    Blood Pressure (Exit)  134/68    Heart Rate (Admit)  60 bpm    Heart Rate (Exercise)  105 bpm    Heart Rate (Exit)  58 bpm    Rating of Perceived Exertion (Exercise)  13    Symptoms  none    Duration  Progress to 30 minutes of  aerobic without signs/symptoms of physical distress    Intensity  THRR unchanged      Progression   Progression  Continue to progress workloads to maintain intensity without signs/symptoms of physical distress.    Average METs  4      Resistance Training   Training Prescription  Yes    Weight  3lbs    Reps   10-15    Time  10 Minutes      Interval Training   Interval Training  No      Treadmill   MPH  3    Grade  3    Minutes  10    METs  4.54      Bike   Level  1.4    Minutes  10    METs  4.51      NuStep   Level  3    SPM  80    Minutes  10    METs  3       Nutrition:  Target Goals: Understanding of nutrition guidelines, daily intake of sodium 1500mg , cholesterol 200mg , calories 30% from fat and 7% or less from saturated fats, daily to have 5 or more servings of fruits and vegetables.  Biometrics: Pre Biometrics - 09/29/17 1152      Pre Biometrics   Height  5\' 9"  (1.753 m)    Weight  169 lb 5 oz (76.8 kg)    Waist Circumference  39.25 inches    Hip Circumference  38.25 inches    Waist to Hip Ratio  1.03 %    BMI (Calculated)  24.99    Triceps Skinfold  23 mm    % Body Fat  28.2 %    Grip Strength  41 kg    Flexibility  0 in    Single Leg Stand  12.84 seconds        Nutrition Therapy Plan and Nutrition Goals: Nutrition Therapy & Goals - 10/17/17 1034      Nutrition Therapy   Diet  heart healthy      Personal Nutrition Goals   Nutrition Goal  pt to identify and limit sources of saturated fat, trans fat, and sodium in diet      Intervention Plan   Intervention  Prescribe, educate and counsel regarding individualized specific dietary modifications aiming towards targeted core components such as weight, hypertension, lipid management, diabetes, heart failure and other comorbidities.    Expected Outcomes  Short Term Goal: Understand basic principles of dietary content, such as calories, fat, sodium, cholesterol and nutrients.;Long Term Goal: Adherence to prescribed nutrition plan.       Nutrition Assessments: Nutrition Assessments - 09/29/17 1056      MEDFICTS Scores   Pre Score  12       Nutrition Goals Re-Evaluation: Nutrition Goals Re-Evaluation    [place] Name 10/17/17 1035             Goals   Nutrition Goal  Pt to identify and limit sources of  saturated fat, trans fat, and sodium in diet          Nutrition Goals Re-Evaluation: Nutrition Goals Re-Evaluation    [place] Name 10/17/17 1035             Goals   Nutrition Goal  Pt to identify and limit sources of saturated fat, trans fat, and sodium in diet  Nutrition Goals Discharge (Final Nutrition Goals Re-Evaluation): Nutrition Goals Re-Evaluation - 10/17/17 1035      Goals   Nutrition Goal  Pt to identify and limit sources of saturated fat, trans fat, and sodium in diet       Psychosocial: Target Goals: Acknowledge presence or absence of significant depression and/or stress, maximize coping skills, provide positive support system. Participant is able to verbalize types and ability to use techniques and skills needed for reducing stress and depression.  Initial Review & Psychosocial Screening: Initial Psych Review & Screening - 09/29/17 1221      Initial Review   Current issues with  None Identified      Family Dynamics   Good Support System?  Yes Nguyen reports his wife as a source of support.      Barriers   Psychosocial barriers to participate in program  There are no identifiable barriers or psychosocial needs.      Screening Interventions   Interventions  Encouraged to exercise       Quality of Life Scores: Quality of Life - 09/29/17 1157      Quality of Life Scores   Health/Function Pre  28.53 %    Socioeconomic Pre  30 %    Psych/Spiritual Pre  28.93 %    Family Pre  28.8 %    GLOBAL Pre  28.92 %      Scores of 19 and below usually indicate a poorer quality of life in these areas.  A difference of  2-3 points is a clinically meaningful difference.  A difference of 2-3 points in the total score of the Quality of Life Index has been associated with significant improvement in overall quality of life, self-image, physical symptoms, and general health in studies assessing change in quality of life.  PHQ-9: Recent Review Flowsheet Data     Depression screen [facility] 2/9 10/05/2017   Decreased Interest 0   Down, Depressed, Hopeless 0   PHQ - 2 Score 0     Interpretation of Total Score  Total Score Depression Severity:  1-4 = Minimal depression, 5-9 = Mild depression, 10-14 = Moderate depression, 15-19 = Moderately severe depression, 20-27 = Severe depression   Psychosocial Evaluation and Intervention:   Psychosocial Re-Evaluation: Psychosocial Re-Evaluation    [place] Name 10/20/17 1033             Psychosocial Re-Evaluation   Current issues with  None Identified       Interventions  Encouraged to attend Cardiac Rehabilitation for the exercise       Continue Psychosocial Services   No Follow up required          Psychosocial Discharge (Final Psychosocial Re-Evaluation): Psychosocial Re-Evaluation - 10/20/17 1033      Psychosocial Re-Evaluation   Current issues with  None Identified    Interventions  Encouraged to attend Cardiac Rehabilitation for the exercise    Continue Psychosocial Services   No Follow up required       Vocational Rehabilitation: Provide vocational rehab assistance to qualifying candidates.   Vocational Rehab Evaluation & Intervention: Vocational Rehab - 09/29/17 1221      Initial Vocational Rehab Evaluation & Intervention   Assessment shows need for Vocational Rehabilitation  No       Education: Education Goals: Education classes will be provided on a weekly basis, covering required topics. Participant will state understanding/return demonstration of topics presented.  Learning Barriers/Preferences: Learning Barriers/Preferences - 09/29/17 1610      Learning Barriers/Preferences  Learning Barriers  Sight    Learning Preferences  Written Material;Pictoral;Video       Education Topics: Count Your Pulse:  -Group instruction provided by verbal instruction, demonstration, patient participation and written materials to support subject.  Instructors address importance of being able to  find your pulse and how to count your pulse when at home without a heart monitor.  Patients get hands on experience counting their pulse with staff help and individually.   Heart Attack, Angina, and Risk Factor Modification:  -Group instruction provided by verbal instruction, video, and written materials to support subject.  Instructors address signs and symptoms of angina and heart attacks.    Also discuss risk factors for heart disease and how to make changes to improve heart health risk factors.   Functional Fitness:  -Group instruction provided by verbal instruction, demonstration, patient participation, and written materials to support subject.  Instructors address safety measures for doing things around the house.  Discuss how to get up and down off the floor, how to pick things up properly, how to safely get out of a chair without assistance, and balance training.   Meditation and Mindfulness:  -Group instruction provided by verbal instruction, patient participation, and written materials to support subject.  Instructor addresses importance of mindfulness and meditation practice to help reduce stress and improve awareness.  Instructor also leads participants through a meditation exercise.    Stretching for Flexibility and Mobility:  -Group instruction provided by verbal instruction, patient participation, and written materials to support subject.  Instructors lead participants through series of stretches that are designed to increase flexibility thus improving mobility.  These stretches are additional exercise for major muscle groups that are typically performed during regular warm up and cool down.   Hands Only CPR:  -Group verbal, video, and participation provides a basic overview of AHA guidelines for community CPR. Role-play of emergencies allow participants the opportunity to practice calling for help and chest compression technique with discussion of AED  use.   Hypertension: -Group verbal and written instruction that provides a basic overview of hypertension including the most recent diagnostic guidelines, risk factor reduction with self-care instructions and medication management.    Nutrition I class: Heart Healthy Eating:  -Group instruction provided by PowerPoint slides, verbal discussion, and written materials to support subject matter. The instructor gives an explanation and review of the Therapeutic Lifestyle Changes diet recommendations, which includes a discussion on lipid goals, dietary fat, sodium, fiber, plant stanol/sterol esters, sugar, and the components of a well-balanced, healthy diet.   Nutrition II class: Lifestyle Skills:  -Group instruction provided by PowerPoint slides, verbal discussion, and written materials to support subject matter. The instructor gives an explanation and review of label reading, grocery shopping for heart health, heart healthy recipe modifications, and ways to make healthier choices when eating out.   Diabetes Question & Answer:  -Group instruction provided by PowerPoint slides, verbal discussion, and written materials to support subject matter. The instructor gives an explanation and review of diabetes co-morbidities, pre- and post-prandial blood glucose goals, pre-exercise blood glucose goals, signs, symptoms, and treatment of hypoglycemia and hyperglycemia, and foot care basics.   Diabetes Blitz:  -Group instruction provided by PowerPoint slides, verbal discussion, and written materials to support subject matter. The instructor gives an explanation and review of the physiology behind type 1 and type 2 diabetes, diabetes medications and rational behind using different medications, pre- and post-prandial blood glucose recommendations and Hemoglobin A1c goals, diabetes diet, and exercise including  blood glucose guidelines for exercising safely.    Portion Distortion:  -Group instruction provided by  PowerPoint slides, verbal discussion, written materials, and food models to support subject matter. The instructor gives an explanation of serving size versus portion size, changes in portions sizes over the last 20 years, and what consists of a serving from each food group.   Stress Management:  -Group instruction provided by verbal instruction, video, and written materials to support subject matter.  Instructors review role of stress in heart disease and how to cope with stress positively.     Exercising on Your Own:  -Group instruction provided by verbal instruction, power point, and written materials to support subject.  Instructors discuss benefits of exercise, components of exercise, frequency and intensity of exercise, and end points for exercise.  Also discuss use of nitroglycerin and activating EMS.  Review options of places to exercise outside of rehab.  Review guidelines for sex with heart disease.   Cardiac Drugs I:  -Group instruction provided by verbal instruction and written materials to support subject.  Instructor reviews cardiac drug classes: antiplatelets, anticoagulants, beta blockers, and statins.  Instructor discusses reasons, side effects, and lifestyle considerations for each drug class.   CARDIAC REHAB PHASE II EXERCISE from 10/19/2017 in [place]  Date  10/19/17  Instruction Review Code  2- Demonstrated Understanding      Cardiac Drugs II:  -Group instruction provided by verbal instruction and written materials to support subject.  Instructor reviews cardiac drug classes: angiotensin converting enzyme inhibitors (ACE-I), angiotensin II receptor blockers (ARBs), nitrates, and calcium channel blockers.  Instructor discusses reasons, side effects, and lifestyle considerations for each drug class.   Anatomy and Physiology of the Circulatory System:  Group verbal and written instruction and models provide basic cardiac anatomy and  physiology, with the coronary electrical and arterial systems. Review of: AMI, Angina, Valve disease, Heart Failure, Peripheral Artery Disease, Cardiac Arrhythmia, Pacemakers, and the ICD.   Other Education:  -Group or individual verbal, written, or video instructions that support the educational goals of the cardiac rehab program.   Holiday Eating Survival Tips:  -Group instruction provided by PowerPoint slides, verbal discussion, and written materials to support subject matter. The instructor gives patients tips, tricks, and techniques to help them not only survive but enjoy the holidays despite the onslaught of food that accompanies the holidays.   Knowledge Questionnaire Score: Knowledge Questionnaire Score - 09/29/17 1143      Knowledge Questionnaire Score   Pre Score  19/24       Core Components/Risk Factors/Patient Goals at Admission: Personal Goals and Risk Factors at Admission - 09/29/17 1152      Core Components/Risk Factors/Patient Goals on Admission    Weight Management  Yes;Weight Maintenance    Intervention  Weight Management: Develop a combined nutrition and exercise program designed to reach desired caloric intake, while maintaining appropriate intake of nutrient and fiber, sodium and fats, and appropriate energy expenditure required for the weight goal.;Weight Management: Provide education and appropriate resources to help participant work on and attain dietary goals.;Weight Management/Obesity: Establish reasonable short term and long term weight goals.    Admit Weight  169 lb 5 oz (76.8 kg)    Goal Weight: Short Term  169 lb (76.7 kg)    Goal Weight: Long Term  169 lb (76.7 kg)    Expected Outcomes  Short Term: Continue to assess and modify interventions until short term weight is achieved;Long Term: Adherence to  nutrition and physical activity/exercise program aimed toward attainment of established weight goal;Weight Maintenance: Understanding of the daily nutrition  guidelines, which includes 25-35% calories from fat, 7% or less cal from saturated fats, less than 200mg  cholesterol, less than 1.5gm of sodium, & 5 or more servings of fruits and vegetables daily;Understanding recommendations for meals to include 15-35% energy as protein, 25-35% energy from fat, 35-60% energy from carbohydrates, less than 200mg  of dietary cholesterol, 20-35 gm of total fiber daily;Understanding of distribution of calorie intake throughout the day with the consumption of 4-5 meals/snacks    Hypertension  Yes    Intervention  Provide education on lifestyle modifcations including regular physical activity/exercise, weight management, moderate sodium restriction and increased consumption of fresh fruit, vegetables, and low fat dairy, alcohol moderation, and smoking cessation.;Monitor prescription use compliance.    Expected Outcomes  Short Term: Continued assessment and intervention until BP is < 140/73mm HG in hypertensive participants. < 130/37mm HG in hypertensive participants with diabetes, heart failure or chronic kidney disease.;Long Term: Maintenance of blood pressure at goal levels.    Lipids  Yes    Intervention  Provide education and support for participant on nutrition & aerobic/resistive exercise along with prescribed medications to achieve LDL 70mg , HDL >40mg .    Expected Outcomes  Short Term: Participant states understanding of desired cholesterol values and is compliant with medications prescribed. Participant is following exercise prescription and nutrition guidelines.;Long Term: Cholesterol controlled with medications as prescribed, with individualized exercise RX and with personalized nutrition plan. Value goals: LDL < 70mg , HDL > 40 mg.       Core Components/Risk Factors/Patient Goals Review:  Goals and Risk Factor Review    Row Name 10/20/17 1023             Core Components/Risk Factors/Patient Goals Review   Personal Goals Review  Weight  Management/Obesity;Hypertension;Lipids       Review  Miner's vital signs and weights have been stable at cardiac rehab. Waring is doing well with exercise.       Expected Outcomes  Patient will continue to partcipate in phase 2 cardiac rehab, follow a heart healthy diet and make lifestyle modifications.          Core Components/Risk Factors/Patient Goals at Discharge (Final Review):  Goals and Risk Factor Review - 10/20/17 1023      Core Components/Risk Factors/Patient Goals Review   Personal Goals Review  Weight Management/Obesity;Hypertension;Lipids    Review  Yablonski's vital signs and weights have been stable at cardiac rehab. Robson is doing well with exercise.    Expected Outcomes  Patient will continue to partcipate in phase 2 cardiac rehab, follow a heart healthy diet and make lifestyle modifications.       ITP Comments: ITP Comments    Row Name 09/27/17 1432 10/20/17 1009         ITP Comments  Dr. Fransico Him, Medical Director  30 Day ITP Review. Patient with good participation and attendance at phase 2 cardiac rehab.         Comments: See ITP comments.Harrell Gave RN BSN

## 2017-10-21 ENCOUNTER — Encounter (HOSPITAL_COMMUNITY): Payer: Medicare Other

## 2017-10-21 ENCOUNTER — Encounter (HOSPITAL_COMMUNITY)
Admission: RE | Admit: 2017-10-21 | Discharge: 2017-10-21 | Disposition: A | Discharge status: Home / Self Care | Payer: Medicare Other | Source: Ambulatory Visit | Attending: Cardiology | Admitting: Cardiology

## 2017-10-21 DIAGNOSIS — I10 Essential (primary) hypertension: Secondary | ICD-10-CM | POA: Diagnosis not present

## 2017-10-21 DIAGNOSIS — Z79899 Other long term (current) drug therapy: Secondary | ICD-10-CM | POA: Diagnosis not present

## 2017-10-21 DIAGNOSIS — Z951 Presence of aortocoronary bypass graft: Secondary | ICD-10-CM

## 2017-10-21 DIAGNOSIS — Z7982 Long term (current) use of aspirin: Secondary | ICD-10-CM | POA: Diagnosis not present

## 2017-10-21 DIAGNOSIS — Z7901 Long term (current) use of anticoagulants: Secondary | ICD-10-CM | POA: Diagnosis not present

## 2017-10-21 DIAGNOSIS — M1990 Unspecified osteoarthritis, unspecified site: Secondary | ICD-10-CM | POA: Diagnosis not present

## 2017-10-24 ENCOUNTER — Encounter (HOSPITAL_COMMUNITY): Payer: Medicare Other

## 2017-10-24 ENCOUNTER — Encounter (HOSPITAL_COMMUNITY)
Admission: RE | Admit: 2017-10-24 | Discharge: 2017-10-24 | Disposition: A | Discharge status: Home / Self Care | Payer: Medicare Other | Source: Ambulatory Visit | Attending: Cardiology | Admitting: Cardiology

## 2017-10-24 DIAGNOSIS — Z79899 Other long term (current) drug therapy: Secondary | ICD-10-CM | POA: Diagnosis not present

## 2017-10-24 DIAGNOSIS — Z7901 Long term (current) use of anticoagulants: Secondary | ICD-10-CM | POA: Diagnosis not present

## 2017-10-24 DIAGNOSIS — M1990 Unspecified osteoarthritis, unspecified site: Secondary | ICD-10-CM | POA: Diagnosis not present

## 2017-10-24 DIAGNOSIS — Z7982 Long term (current) use of aspirin: Secondary | ICD-10-CM | POA: Diagnosis not present

## 2017-10-24 DIAGNOSIS — Z951 Presence of aortocoronary bypass graft: Secondary | ICD-10-CM | POA: Diagnosis not present

## 2017-10-24 DIAGNOSIS — I10 Essential (primary) hypertension: Secondary | ICD-10-CM | POA: Diagnosis not present

## 2017-10-26 ENCOUNTER — Other Ambulatory Visit: Payer: Self-pay | Admitting: Cardiothoracic Surgery

## 2017-10-26 ENCOUNTER — Encounter (HOSPITAL_COMMUNITY)
Admission: RE | Admit: 2017-10-26 | Discharge: 2017-10-26 | Disposition: A | Discharge status: Home / Self Care | Payer: Medicare Other | Source: Ambulatory Visit | Attending: Cardiology | Admitting: Cardiology

## 2017-10-26 ENCOUNTER — Encounter (HOSPITAL_COMMUNITY): Payer: Medicare Other

## 2017-10-26 DIAGNOSIS — I10 Essential (primary) hypertension: Secondary | ICD-10-CM | POA: Diagnosis not present

## 2017-10-26 DIAGNOSIS — Z951 Presence of aortocoronary bypass graft: Secondary | ICD-10-CM

## 2017-10-26 DIAGNOSIS — Z7901 Long term (current) use of anticoagulants: Secondary | ICD-10-CM | POA: Diagnosis not present

## 2017-10-26 DIAGNOSIS — Z79899 Other long term (current) drug therapy: Secondary | ICD-10-CM | POA: Diagnosis not present

## 2017-10-26 DIAGNOSIS — Z7982 Long term (current) use of aspirin: Secondary | ICD-10-CM | POA: Diagnosis not present

## 2017-10-26 DIAGNOSIS — M1990 Unspecified osteoarthritis, unspecified site: Secondary | ICD-10-CM | POA: Diagnosis not present

## 2017-10-28 ENCOUNTER — Encounter (HOSPITAL_COMMUNITY)
Admission: RE | Admit: 2017-10-28 | Discharge: 2017-10-28 | Disposition: A | Discharge status: Home / Self Care | Payer: Medicare Other | Source: Ambulatory Visit | Attending: Cardiology | Admitting: Cardiology

## 2017-10-28 ENCOUNTER — Encounter (HOSPITAL_COMMUNITY): Payer: Medicare Other

## 2017-10-28 DIAGNOSIS — Z7982 Long term (current) use of aspirin: Secondary | ICD-10-CM | POA: Diagnosis not present

## 2017-10-28 DIAGNOSIS — I10 Essential (primary) hypertension: Secondary | ICD-10-CM | POA: Diagnosis not present

## 2017-10-28 DIAGNOSIS — Z951 Presence of aortocoronary bypass graft: Secondary | ICD-10-CM

## 2017-10-28 DIAGNOSIS — M1990 Unspecified osteoarthritis, unspecified site: Secondary | ICD-10-CM | POA: Diagnosis not present

## 2017-10-28 DIAGNOSIS — Z7901 Long term (current) use of anticoagulants: Secondary | ICD-10-CM | POA: Diagnosis not present

## 2017-10-28 DIAGNOSIS — Z79899 Other long term (current) drug therapy: Secondary | ICD-10-CM | POA: Diagnosis not present

## 2017-10-31 ENCOUNTER — Encounter (HOSPITAL_COMMUNITY): Payer: Medicare Other

## 2017-10-31 ENCOUNTER — Encounter (HOSPITAL_COMMUNITY)
Admission: RE | Admit: 2017-10-31 | Discharge: 2017-10-31 | Disposition: A | Discharge status: Home / Self Care | Payer: Medicare Other | Source: Ambulatory Visit | Attending: Cardiology | Admitting: Cardiology

## 2017-10-31 DIAGNOSIS — Z951 Presence of aortocoronary bypass graft: Secondary | ICD-10-CM

## 2017-10-31 DIAGNOSIS — Z79899 Other long term (current) drug therapy: Secondary | ICD-10-CM | POA: Diagnosis not present

## 2017-10-31 DIAGNOSIS — Z7982 Long term (current) use of aspirin: Secondary | ICD-10-CM | POA: Diagnosis not present

## 2017-10-31 DIAGNOSIS — Z7901 Long term (current) use of anticoagulants: Secondary | ICD-10-CM | POA: Diagnosis not present

## 2017-10-31 DIAGNOSIS — I10 Essential (primary) hypertension: Secondary | ICD-10-CM | POA: Diagnosis not present

## 2017-10-31 DIAGNOSIS — M1990 Unspecified osteoarthritis, unspecified site: Secondary | ICD-10-CM | POA: Diagnosis not present

## 2017-11-02 ENCOUNTER — Encounter (HOSPITAL_COMMUNITY): Payer: Medicare Other

## 2017-11-02 ENCOUNTER — Encounter (HOSPITAL_COMMUNITY)
Admission: RE | Admit: 2017-11-02 | Discharge: 2017-11-02 | Disposition: A | Discharge status: Home / Self Care | Payer: Medicare Other | Source: Ambulatory Visit | Attending: Cardiology | Admitting: Cardiology

## 2017-11-02 DIAGNOSIS — Z951 Presence of aortocoronary bypass graft: Secondary | ICD-10-CM

## 2017-11-02 DIAGNOSIS — Z7901 Long term (current) use of anticoagulants: Secondary | ICD-10-CM | POA: Diagnosis not present

## 2017-11-02 DIAGNOSIS — M1990 Unspecified osteoarthritis, unspecified site: Secondary | ICD-10-CM | POA: Diagnosis not present

## 2017-11-02 DIAGNOSIS — Z7982 Long term (current) use of aspirin: Secondary | ICD-10-CM | POA: Diagnosis not present

## 2017-11-02 DIAGNOSIS — I10 Essential (primary) hypertension: Secondary | ICD-10-CM | POA: Diagnosis not present

## 2017-11-02 DIAGNOSIS — Z79899 Other long term (current) drug therapy: Secondary | ICD-10-CM | POA: Diagnosis not present

## 2017-11-04 ENCOUNTER — Encounter (HOSPITAL_COMMUNITY): Payer: Medicare Other

## 2017-11-04 ENCOUNTER — Encounter (HOSPITAL_COMMUNITY)
Admission: RE | Admit: 2017-11-04 | Discharge: 2017-11-04 | Disposition: A | Discharge status: Home / Self Care | Payer: Medicare Other | Source: Ambulatory Visit | Attending: Cardiology | Admitting: Cardiology

## 2017-11-04 DIAGNOSIS — Z951 Presence of aortocoronary bypass graft: Secondary | ICD-10-CM | POA: Diagnosis not present

## 2017-11-04 DIAGNOSIS — M1990 Unspecified osteoarthritis, unspecified site: Secondary | ICD-10-CM | POA: Diagnosis not present

## 2017-11-04 DIAGNOSIS — I10 Essential (primary) hypertension: Secondary | ICD-10-CM | POA: Diagnosis not present

## 2017-11-04 DIAGNOSIS — Z7982 Long term (current) use of aspirin: Secondary | ICD-10-CM | POA: Diagnosis not present

## 2017-11-04 DIAGNOSIS — Z7901 Long term (current) use of anticoagulants: Secondary | ICD-10-CM | POA: Diagnosis not present

## 2017-11-04 DIAGNOSIS — Z79899 Other long term (current) drug therapy: Secondary | ICD-10-CM | POA: Diagnosis not present

## 2017-11-07 ENCOUNTER — Encounter (HOSPITAL_COMMUNITY)
Admission: RE | Admit: 2017-11-07 | Discharge: 2017-11-07 | Disposition: A | Discharge status: Home / Self Care | Payer: Medicare Other | Source: Ambulatory Visit | Attending: Cardiology | Admitting: Cardiology

## 2017-11-07 ENCOUNTER — Encounter (HOSPITAL_COMMUNITY): Payer: Medicare Other

## 2017-11-07 DIAGNOSIS — M1990 Unspecified osteoarthritis, unspecified site: Secondary | ICD-10-CM | POA: Diagnosis not present

## 2017-11-07 DIAGNOSIS — Z951 Presence of aortocoronary bypass graft: Secondary | ICD-10-CM | POA: Diagnosis not present

## 2017-11-07 DIAGNOSIS — Z7982 Long term (current) use of aspirin: Secondary | ICD-10-CM | POA: Diagnosis not present

## 2017-11-07 DIAGNOSIS — Z79899 Other long term (current) drug therapy: Secondary | ICD-10-CM | POA: Diagnosis not present

## 2017-11-07 DIAGNOSIS — Z7901 Long term (current) use of anticoagulants: Secondary | ICD-10-CM | POA: Diagnosis not present

## 2017-11-07 DIAGNOSIS — I10 Essential (primary) hypertension: Secondary | ICD-10-CM | POA: Diagnosis not present

## 2017-11-09 ENCOUNTER — Encounter (HOSPITAL_COMMUNITY): Payer: Medicare Other

## 2017-11-09 ENCOUNTER — Other Ambulatory Visit: Payer: Self-pay | Admitting: Cardiology

## 2017-11-09 ENCOUNTER — Encounter (HOSPITAL_COMMUNITY)
Admission: RE | Admit: 2017-11-09 | Discharge: 2017-11-09 | Disposition: A | Discharge status: Home / Self Care | Payer: Medicare Other | Source: Ambulatory Visit | Attending: Cardiology | Admitting: Cardiology

## 2017-11-09 DIAGNOSIS — I10 Essential (primary) hypertension: Secondary | ICD-10-CM | POA: Diagnosis not present

## 2017-11-09 DIAGNOSIS — Z951 Presence of aortocoronary bypass graft: Secondary | ICD-10-CM

## 2017-11-09 DIAGNOSIS — Z79899 Other long term (current) drug therapy: Secondary | ICD-10-CM | POA: Diagnosis not present

## 2017-11-09 DIAGNOSIS — Z7901 Long term (current) use of anticoagulants: Secondary | ICD-10-CM | POA: Diagnosis not present

## 2017-11-09 DIAGNOSIS — Z7982 Long term (current) use of aspirin: Secondary | ICD-10-CM | POA: Diagnosis not present

## 2017-11-09 DIAGNOSIS — M1990 Unspecified osteoarthritis, unspecified site: Secondary | ICD-10-CM | POA: Diagnosis not present

## 2017-11-09 NOTE — Telephone Encounter (Signed)
Rx sent to pharmacy   

## 2017-11-11 ENCOUNTER — Encounter (HOSPITAL_COMMUNITY)
Admission: RE | Admit: 2017-11-11 | Discharge: 2017-11-11 | Disposition: A | Discharge status: Home / Self Care | Payer: Medicare Other | Source: Ambulatory Visit | Attending: Cardiology | Admitting: Cardiology

## 2017-11-11 ENCOUNTER — Encounter (HOSPITAL_COMMUNITY): Payer: Medicare Other

## 2017-11-11 DIAGNOSIS — Z951 Presence of aortocoronary bypass graft: Secondary | ICD-10-CM | POA: Diagnosis not present

## 2017-11-11 DIAGNOSIS — Z7982 Long term (current) use of aspirin: Secondary | ICD-10-CM | POA: Diagnosis not present

## 2017-11-11 DIAGNOSIS — Z79899 Other long term (current) drug therapy: Secondary | ICD-10-CM | POA: Diagnosis not present

## 2017-11-11 DIAGNOSIS — M1990 Unspecified osteoarthritis, unspecified site: Secondary | ICD-10-CM | POA: Diagnosis not present

## 2017-11-11 DIAGNOSIS — I219 Acute myocardial infarction, unspecified: Secondary | ICD-10-CM | POA: Diagnosis not present

## 2017-11-11 DIAGNOSIS — I10 Essential (primary) hypertension: Secondary | ICD-10-CM | POA: Diagnosis not present

## 2017-11-11 DIAGNOSIS — Z87891 Personal history of nicotine dependence: Secondary | ICD-10-CM | POA: Diagnosis not present

## 2017-11-11 DIAGNOSIS — E785 Hyperlipidemia, unspecified: Secondary | ICD-10-CM | POA: Diagnosis not present

## 2017-11-11 DIAGNOSIS — Z7901 Long term (current) use of anticoagulants: Secondary | ICD-10-CM | POA: Insufficient documentation

## 2017-11-14 ENCOUNTER — Encounter (HOSPITAL_COMMUNITY)
Admission: RE | Admit: 2017-11-14 | Discharge: 2017-11-14 | Disposition: A | Discharge status: Home / Self Care | Payer: Medicare Other | Source: Ambulatory Visit | Attending: Cardiology | Admitting: Cardiology

## 2017-11-14 ENCOUNTER — Encounter (HOSPITAL_COMMUNITY): Payer: Medicare Other

## 2017-11-14 DIAGNOSIS — Z7901 Long term (current) use of anticoagulants: Secondary | ICD-10-CM | POA: Diagnosis not present

## 2017-11-14 DIAGNOSIS — Z951 Presence of aortocoronary bypass graft: Secondary | ICD-10-CM | POA: Diagnosis not present

## 2017-11-14 DIAGNOSIS — Z7982 Long term (current) use of aspirin: Secondary | ICD-10-CM | POA: Diagnosis not present

## 2017-11-14 DIAGNOSIS — M1990 Unspecified osteoarthritis, unspecified site: Secondary | ICD-10-CM | POA: Diagnosis not present

## 2017-11-14 DIAGNOSIS — I10 Essential (primary) hypertension: Secondary | ICD-10-CM | POA: Diagnosis not present

## 2017-11-14 DIAGNOSIS — Z79899 Other long term (current) drug therapy: Secondary | ICD-10-CM | POA: Diagnosis not present

## 2017-11-15 NOTE — Progress Notes (Signed)
Cardiac Individual Treatment Plan  Patient Details  Name: Patrick Nguyen MRN: [id number] Date of Birth: 11-07-1940 Referring Provider:     CARDIAC REHAB PHASE II ORIENTATION from 09/29/2017 in [facility] [place]  Referring Provider  Minus Breeding MD      Initial Encounter Date:    CARDIAC REHAB PHASE II ORIENTATION from 09/29/2017 in [facility]  Date  09/29/17      Visit Diagnosis: S/P CABG (coronary artery bypass graft)  Patient's Home Medications on Admission:  Current Outpatient Medications:  .  aspirin EC 81 MG tablet, Take 81 mg by mouth daily., Disp: , Rfl:  .  lisinopril (PRINIVIL,ZESTRIL) 2.5 MG tablet, Take 2.5 mg by mouth daily., Disp: , Rfl:  .  metoprolol tartrate (LOPRESSOR) 25 MG tablet, TAKE 1 TABLET(25 MG) BY MOUTH TWICE DAILY, Disp: 60 tablet, Rfl: 3 .  rosuvastatin (CRESTOR) 20 MG tablet, Take 1 tablet (20 mg total) by mouth every evening., Disp: 30 tablet, Rfl: 2  Past Medical History: Past Medical History:  Diagnosis Date  . Anginal pain ([place]) 05/13/2017  . Arthritis   . CAD (coronary artery disease) 05/13/2017   s/p CABG w/ / LIMA-LAD, SVG-OM, SVG-PDA  . Dyspnea    w/ exertion   . Elevated coronary artery calcium score   . GERD (gastroesophageal reflux disease)    hx  . Hepatitis    in   high  school    . Hyperlipidemia   . Hypertension   . Myocardial infarction ([place])   . Rosacea     Tobacco Use: Social History   Tobacco Use  Smoking Status Former Smoker  . Last attempt to quit: 05/13/1986  . Years since quitting: 31.5  Smokeless Tobacco Never Used    Labs: Recent Chemical engineer    Labs for ITP Cardiac and Pulmonary Rehab Latest Ref Rng & Units 06/07/2017 06/07/2017 06/08/2017 06/09/2017 09/26/2017   Cholestrol 100 - 199 mg/dL - - - - 134   LDLCALC 0 - 99 mg/dL - - - - 49   HDL >39 mg/dL - - - - 70   Trlycerides 0 - 149 mg/dL - - - - 73   Hemoglobin A1c 4.8 - 5.6 % - - - - -    PHART 7.350 - 7.450 7.298(L) - - - -   PCO2ART 32.0 - 48.0 mmHg 45.7 - - - -   HCO3 20.0 - 28.0 mmol/L 22.4 - - - -   TCO2 22 - 32 mmol/L 24 23 25 26  -   ACIDBASEDEF 0.0 - 2.0 mmol/L 4.0(H) - - - -   O2SAT % 95.0 - - 64.5 -      Capillary Blood Glucose: Lab Results  Component Value Date   GLUCAP 102 (H) 06/11/2017   GLUCAP 93 06/11/2017   GLUCAP 83 06/11/2017   GLUCAP 78 06/10/2017   GLUCAP 94 06/10/2017     Exercise Target Goals:    Exercise Program Goal: Individual exercise prescription set using results from initial 6 min walk test and THRR while considering  patient's activity barriers and safety.   Exercise Prescription Goal: Initial exercise prescription builds to 30-45 minutes a day of aerobic activity, 2-3 days per week.  Home exercise guidelines will be given to patient during program as part of exercise prescription that the participant will acknowledge.  Activity Barriers & Risk Stratification: Activity Barriers & Cardiac Risk Stratification - 09/29/17 0917      Activity Barriers & Cardiac  Risk Stratification   Activity Barriers  Deconditioning;Muscular Weakness;Other (comment);Arthritis    Comments  L RTC repair, R shoulder pain and limitations    Cardiac Risk Stratification  High       6 Minute Walk: 6 Minute Walk    Row Name 09/29/17 1144         6 Minute Walk   Phase  Initial     Distance  1779 feet     Walk Time  6 minutes     # of Rest Breaks  0     MPH  3.4     METS  3.4     RPE  11     VO2 Peak  11.96     Symptoms  No     Resting HR  60 bpm     Resting BP  118/60     Resting Oxygen Saturation   95 %     Exercise Oxygen Saturation  during 6 min walk  96 %     Max Ex. HR  83 bpm     Max Ex. BP  120/80     2 Minute Post BP  126/68        Oxygen Initial Assessment:   Oxygen Re-Evaluation:   Oxygen Discharge (Final Oxygen Re-Evaluation):   Initial Exercise Prescription: Initial Exercise Prescription - 09/29/17 1100      Date  of Initial Exercise RX and Referring Provider   Date  09/29/17    Referring Provider  Minus Breeding MD      Treadmill   MPH  3    Grade  0    Minutes  10    METs  3.3      Bike   Level  1    Minutes  10    METs  3.4      NuStep   Level  3    SPM  80    Minutes  10    METs  3      Prescription Details   Frequency (times per week)  3    Duration  Progress to 30 minutes of continuous aerobic without signs/symptoms of physical distress      Intensity   THRR 40-80% of Max Heartrate  58-116    Ratings of Perceived Exertion  11-13    Perceived Dyspnea  0-4      Progression   Progression  Continue to progress workloads to maintain intensity without signs/symptoms of physical distress.      Resistance Training   Training Prescription  Yes    Weight  3lbs    Reps  10-15       Perform Capillary Blood Glucose checks as needed.  Exercise Prescription Changes: Exercise Prescription Changes    Row Name 10/05/17 0955 10/10/17 0948 10/24/17 0952 11/09/17 0948       Response to Exercise   Blood Pressure (Admit)  112/60  120/70  120/70  116/68    Blood Pressure (Exercise)  142/84  128/70  132/80  138/80    Blood Pressure (Exit)  118/62  134/68  130/78  104/62    Heart Rate (Admit)  52 bpm  60 bpm  64 bpm  59 bpm    Heart Rate (Exercise)  98 bpm  105 bpm  111 bpm  117 bpm    Heart Rate (Exit)  60 bpm  58 bpm  64 bpm  60 bpm    Rating of Perceived Exertion (Exercise)  12  13  13  13     Symptoms  none  none  none  none    Duration  Progress to 30 minutes of  aerobic without signs/symptoms of physical distress  Progress to 30 minutes of  aerobic without signs/symptoms of physical distress  Continue with 30 min of aerobic exercise without signs/symptoms of physical distress.  Continue with 30 min of aerobic exercise without signs/symptoms of physical distress.    Intensity  THRR unchanged  THRR unchanged  THRR unchanged  THRR unchanged      Progression   Progression  Continue to  progress workloads to maintain intensity without signs/symptoms of physical distress.  Continue to progress workloads to maintain intensity without signs/symptoms of physical distress.  Continue to progress workloads to maintain intensity without signs/symptoms of physical distress.  Continue to progress workloads to maintain intensity without signs/symptoms of physical distress.    Average METs  3.1  4  4.1  5.1      Resistance Training   Training Prescription  No Relaxation day, no weights today.  Yes  Yes  No Relaxation day, no weights.    Weight  -  3lbs  4lbs  -    Reps  -  10-15  10-15  -    Time  -  10 Minutes  10 Minutes  -      Interval Training   Interval Training  No  No  No  Yes    Equipment  -  -  -  Treadmill    Comments  -  -  -  Patient walks 3.0/4 for 8 minutes and 3.3/4 for 2 minutes.      Treadmill   MPH  3  3  3  3  3.3 mph for 2 minutes    Grade  0  3  3  4     Minutes  10  10  10  10     METs  3.3  4.54  4.54  4.95 5.35 METs for 2 minutes      Bike   Level  1  1.4  1.4  2.1    Minutes  10  10  10  10     METs  3.5  4.51  4.47  6.27      NuStep   Level  3  3  4  5     SPM  80  80  80  80    Minutes  10  10  10  10     METs  2.4  3  3.4  3.9      Home Exercise Plan   Plans to continue exercise at  -  -  Home (comment)  Home (comment)    Frequency  -  -  Add 4 additional days to program exercise sessions.  Add 4 additional days to program exercise sessions.    Initial Home Exercises Provided  -  -  10/12/17  10/12/17       Exercise Comments: Exercise Comments    Row Name 10/05/17 1045 10/10/17 1049 10/12/17 1013 10/24/17 1013 11/09/17 1020   Exercise Comments  Patient tolerated exercise well without c/o.  Reviewed METs and goals with patient.   Reviewed home exercise guidelines with patient.   Reviewed METs with patient.   Reviewed METs and goals with patient.       Exercise Goals and Review: Exercise Goals    Row Name 09/29/17 [phone]  Exercise Goals   Increase Physical Activity  Yes       Intervention  Provide advice, education, support and counseling about physical activity/exercise needs.;Develop an individualized exercise prescription for aerobic and resistive training based on initial evaluation findings, risk stratification, comorbidities and participant's personal goals.       Expected Outcomes  Short Term: Attend rehab on a regular basis to increase amount of physical activity.;Long Term: Exercising regularly at least 3-5 days a week.;Long Term: Add in home exercise to make exercise part of routine and to increase amount of physical activity.       Increase Strength and Stamina  Yes       Intervention  Provide advice, education, support and counseling about physical activity/exercise needs.;Develop an individualized exercise prescription for aerobic and resistive training based on initial evaluation findings, risk stratification, comorbidities and participant's personal goals.       Expected Outcomes  Short Term: Increase workloads from initial exercise prescription for resistance, speed, and METs.;Short Term: Perform resistance training exercises routinely during rehab and add in resistance training at home;Long Term: Improve cardiorespiratory fitness, muscular endurance and strength as measured by increased METs and functional capacity (6MWT)       Able to understand and use rate of perceived exertion (RPE) scale  Yes       Intervention  Provide education and explanation on how to use RPE scale       Expected Outcomes  Short Term: Able to use RPE daily in rehab to express subjective intensity level;Long Term:  Able to use RPE to guide intensity level when exercising independently       Knowledge and understanding of Target Heart Rate Range (THRR)  Yes       Intervention  Provide education and explanation of THRR including how the numbers were predicted and where they are located for reference       Expected Outcomes  Short  Term: Able to state/look up THRR;Long Term: Able to use THRR to govern intensity when exercising independently;Short Term: Able to use daily as guideline for intensity in rehab       Able to check pulse independently  Yes       Intervention  Provide education and demonstration on how to check pulse in carotid and radial arteries.;Review the importance of being able to check your own pulse for safety during independent exercise       Expected Outcomes  Short Term: Able to explain why pulse checking is important during independent exercise;Long Term: Able to check pulse independently and accurately       Understanding of Exercise Prescription  Yes       Intervention  Provide education, explanation, and written materials on patient's individual exercise prescription       Expected Outcomes  Short Term: Able to explain program exercise prescription;Long Term: Able to explain home exercise prescription to exercise independently          Exercise Goals Re-Evaluation : Exercise Goals Re-Evaluation    Row Name 10/05/17 1045 10/10/17 1049 10/12/17 1013 11/09/17 1020       Exercise Goal Re-Evaluation   Exercise Goals Review  Able to understand and use rate of perceived exertion (RPE) scale  Able to understand and use rate of perceived exertion (RPE) scale;Increase Physical Activity  Able to understand and use rate of perceived exertion (RPE) scale;Understanding of Exercise Prescription;Knowledge and understanding of Target Heart Rate Range (THRR);Able to check pulse independently  Understanding of Exercise Prescription  Comments  Patient able to understand and use RPE scale appropriately.  Patient is walking 20-25 minutes, 4 days/week in addition to his exercise at [facility].   Reviewed home exercise guidelines with patient including THRR, RPE scale, and endpoints for exercise. Instructed patient on how to count pulse and pt was able to accurately obtain pulse. Pt is walking and has a treadmill at home. Pt also  has a total gym at home, which he plans to use pending clearance from physician.  Patient is walking 20-30 minutes daily as his mode of home exercise. Pt is not using total gym at home at this time because of shoulder pains. Making excellent progress with exercise at [facility].    Expected Outcomes  Patient will exercise at least 30 minutes, 5-7 days/week to help achieve health and fitness goals.  Patient will increase walking duration at home from 20-25 minutes to 30 minutes 4 days/week to help improve stamina and cardiorespiratory fitness.  Patient will walk outdoors or on treadmill at home 3-4 days/week increasing duration to 30 minutes.  Continue current exercise routine to help achieve personal health and fitness goals.        Discharge Exercise Prescription (Final Exercise Prescription Changes): Exercise Prescription Changes - 11/09/17 0948      Response to Exercise   Blood Pressure (Admit)  116/68    Blood Pressure (Exercise)  138/80    Blood Pressure (Exit)  104/62    Heart Rate (Admit)  59 bpm    Heart Rate (Exercise)  117 bpm    Heart Rate (Exit)  60 bpm    Rating of Perceived Exertion (Exercise)  13    Symptoms  none    Duration  Continue with 30 min of aerobic exercise without signs/symptoms of physical distress.    Intensity  THRR unchanged      Progression   Progression  Continue to progress workloads to maintain intensity without signs/symptoms of physical distress.    Average METs  5.1      Resistance Training   Training Prescription  No Relaxation day, no weights.      Interval Training   Interval Training  Yes    Equipment  Treadmill    Comments  Patient walks 3.0/4 for 8 minutes and 3.3/4 for 2 minutes.      Treadmill   MPH  3 3.3 mph for 2 minutes    Grade  4    Minutes  10    METs  4.95 5.35 METs for 2 minutes      Bike   Level  2.1    Minutes  10    METs  6.27      NuStep   Level  5    SPM  80    Minutes  10    METs  3.9      Home Exercise Plan    Plans to continue exercise at  Home (comment)    Frequency  Add 4 additional days to program exercise sessions.    Initial Home Exercises Provided  10/12/17       Nutrition:  Target Goals: Understanding of nutrition guidelines, daily intake of sodium 1500mg , cholesterol 200mg , calories 30% from fat and 7% or less from saturated fats, daily to have 5 or more servings of fruits and vegetables.  Biometrics: Pre Biometrics - 09/29/17 1152      Pre Biometrics   Height  5\' 9"  (1.753 m)    Weight  169 lb 5 oz (76.8  kg)    Waist Circumference  39.25 inches    Hip Circumference  38.25 inches    Waist to Hip Ratio  1.03 %    BMI (Calculated)  24.99    Triceps Skinfold  23 mm    % Body Fat  28.2 %    Grip Strength  41 kg    Flexibility  0 in    Single Leg Stand  12.84 seconds        Nutrition Therapy Plan and Nutrition Goals: Nutrition Therapy & Goals - 10/17/17 1034      Nutrition Therapy   Diet  heart healthy      Personal Nutrition Goals   Nutrition Goal  pt to identify and limit sources of saturated fat, trans fat, and sodium in diet      Intervention Plan   Intervention  Prescribe, educate and counsel regarding individualized specific dietary modifications aiming towards targeted core components such as weight, hypertension, lipid management, diabetes, heart failure and other comorbidities.    Expected Outcomes  Short Term Goal: Understand basic principles of dietary content, such as calories, fat, sodium, cholesterol and nutrients.;Long Term Goal: Adherence to prescribed nutrition plan.       Nutrition Assessments: Nutrition Assessments - 09/29/17 1056      MEDFICTS Scores   Pre Score  12       Nutrition Goals Re-Evaluation: Nutrition Goals Re-Evaluation    Row Name 10/17/17 1035             Goals   Nutrition Goal  Pt to identify and limit sources of saturated fat, trans fat, and sodium in diet          Nutrition Goals Re-Evaluation: Nutrition Goals  Re-Evaluation    [place] Name 10/17/17 1035             Goals   Nutrition Goal  Pt to identify and limit sources of saturated fat, trans fat, and sodium in diet          Nutrition Goals Discharge (Final Nutrition Goals Re-Evaluation): Nutrition Goals Re-Evaluation - 10/17/17 1035      Goals   Nutrition Goal  Pt to identify and limit sources of saturated fat, trans fat, and sodium in diet       Psychosocial: Target Goals: Acknowledge presence or absence of significant depression and/or stress, maximize coping skills, provide positive support system. Participant is able to verbalize types and ability to use techniques and skills needed for reducing stress and depression.  Initial Review & Psychosocial Screening: Initial Psych Review & Screening - 09/29/17 1221      Initial Review   Current issues with  None Identified      Family Dynamics   Good Support System?  Yes Lelynd reports his wife as a source of support.      Barriers   Psychosocial barriers to participate in program  There are no identifiable barriers or psychosocial needs.      Screening Interventions   Interventions  Encouraged to exercise       Quality of Life Scores: Quality of Life - 09/29/17 1157      Quality of Life Scores   Health/Function Pre  28.53 %    Socioeconomic Pre  30 %    Psych/Spiritual Pre  28.93 %    Family Pre  28.8 %    GLOBAL Pre  28.92 %      Scores of 19 and below usually indicate a poorer quality of life in  these areas.  A difference of  2-3 points is a clinically meaningful difference.  A difference of 2-3 points in the total score of the Quality of Life Index has been associated with significant improvement in overall quality of life, self-image, physical symptoms, and general health in studies assessing change in quality of life.  PHQ-9: Recent Review Flowsheet Data    Depression screen [facility] 2/9 10/05/2017   Decreased Interest 0   Down, Depressed, Hopeless 0   PHQ - 2 Score 0      Interpretation of Total Score  Total Score Depression Severity:  1-4 = Minimal depression, 5-9 = Mild depression, 10-14 = Moderate depression, 15-19 = Moderately severe depression, 20-27 = Severe depression   Psychosocial Evaluation and Intervention:   Psychosocial Re-Evaluation: Psychosocial Re-Evaluation    [place] Name 10/20/17 1033 11/15/17 1438           Psychosocial Re-Evaluation   Current issues with  None Identified  None Identified      Interventions  Encouraged to attend Cardiac Rehabilitation for the exercise  Encouraged to attend Cardiac Rehabilitation for the exercise      Continue Psychosocial Services   No Follow up required  No Follow up required         Psychosocial Discharge (Final Psychosocial Re-Evaluation): Psychosocial Re-Evaluation - 11/15/17 1438      Psychosocial Re-Evaluation   Current issues with  None Identified    Interventions  Encouraged to attend Cardiac Rehabilitation for the exercise    Continue Psychosocial Services   No Follow up required       Vocational Rehabilitation: Provide vocational rehab assistance to qualifying candidates.   Vocational Rehab Evaluation & Intervention: Vocational Rehab - 09/29/17 1221      Initial Vocational Rehab Evaluation & Intervention   Assessment shows need for Vocational Rehabilitation  No       Education: Education Goals: Education classes will be provided on a weekly basis, covering required topics. Participant will state understanding/return demonstration of topics presented.  Learning Barriers/Preferences: Learning Barriers/Preferences - 09/29/17 1761      Learning Barriers/Preferences   Learning Barriers  Sight    Learning Preferences  Written Material;Pictoral;Video       Education Topics: Count Your Pulse:  -Group instruction provided by verbal instruction, demonstration, patient participation and written materials to support subject.  Instructors address importance of being able to  find your pulse and how to count your pulse when at home without a heart monitor.  Patients get hands on experience counting their pulse with staff help and individually.   Heart Attack, Angina, and Risk Factor Modification:  -Group instruction provided by verbal instruction, video, and written materials to support subject.  Instructors address signs and symptoms of angina and heart attacks.    Also discuss risk factors for heart disease and how to make changes to improve heart health risk factors.   Functional Fitness:  -Group instruction provided by verbal instruction, demonstration, patient participation, and written materials to support subject.  Instructors address safety measures for doing things around the house.  Discuss how to get up and down off the floor, how to pick things up properly, how to safely get out of a chair without assistance, and balance training.   CARDIAC REHAB PHASE II EXERCISE from 11/09/2017 in [facility]  Date  10/21/17  Instruction Review Code  2- Demonstrated Understanding      Meditation and Mindfulness:  -Group instruction provided by verbal instruction, patient  participation, and written materials to support subject.  Instructor addresses importance of mindfulness and meditation practice to help reduce stress and improve awareness.  Instructor also leads participants through a meditation exercise.    Stretching for Flexibility and Mobility:  -Group instruction provided by verbal instruction, patient participation, and written materials to support subject.  Instructors lead participants through series of stretches that are designed to increase flexibility thus improving mobility.  These stretches are additional exercise for major muscle groups that are typically performed during regular warm up and cool down.   Hands Only CPR:  -Group verbal, video, and participation provides a basic overview of AHA guidelines for community CPR.  Role-play of emergencies allow participants the opportunity to practice calling for help and chest compression technique with discussion of AED use.   Hypertension: -Group verbal and written instruction that provides a basic overview of hypertension including the most recent diagnostic guidelines, risk factor reduction with self-care instructions and medication management.   CARDIAC REHAB PHASE II EXERCISE from 11/09/2017 in [place]  Date  10/28/17  Instruction Review Code  2- Demonstrated Understanding       Nutrition I class: Heart Healthy Eating:  -Group instruction provided by PowerPoint slides, verbal discussion, and written materials to support subject matter. The instructor gives an explanation and review of the Therapeutic Lifestyle Changes diet recommendations, which includes a discussion on lipid goals, dietary fat, sodium, fiber, plant stanol/sterol esters, sugar, and the components of a well-balanced, healthy diet.   Nutrition II class: Lifestyle Skills:  -Group instruction provided by PowerPoint slides, verbal discussion, and written materials to support subject matter. The instructor gives an explanation and review of label reading, grocery shopping for heart health, heart healthy recipe modifications, and ways to make healthier choices when eating out.   Diabetes Question & Answer:  -Group instruction provided by PowerPoint slides, verbal discussion, and written materials to support subject matter. The instructor gives an explanation and review of diabetes co-morbidities, pre- and post-prandial blood glucose goals, pre-exercise blood glucose goals, signs, symptoms, and treatment of hypoglycemia and hyperglycemia, and foot care basics.   Diabetes Blitz:  -Group instruction provided by PowerPoint slides, verbal discussion, and written materials to support subject matter. The instructor gives an explanation and review of the physiology behind  type 1 and type 2 diabetes, diabetes medications and rational behind using different medications, pre- and post-prandial blood glucose recommendations and Hemoglobin A1c goals, diabetes diet, and exercise including blood glucose guidelines for exercising safely.    Portion Distortion:  -Group instruction provided by PowerPoint slides, verbal discussion, written materials, and food models to support subject matter. The instructor gives an explanation of serving size versus portion size, changes in portions sizes over the last 20 years, and what consists of a serving from each food group.   Stress Management:  -Group instruction provided by verbal instruction, video, and written materials to support subject matter.  Instructors review role of stress in heart disease and how to cope with stress positively.     CARDIAC REHAB PHASE II EXERCISE from 11/09/2017 in [place]  Date  11/02/17  Educator  RN  Instruction Review Code  2- Demonstrated Understanding      Exercising on Your Own:  -Group instruction provided by verbal instruction, power point, and written materials to support subject.  Instructors discuss benefits of exercise, components of exercise, frequency and intensity of exercise, and end points for exercise.  Also discuss use  of nitroglycerin and activating EMS.  Review options of places to exercise outside of rehab.  Review guidelines for sex with heart disease.   Cardiac Drugs I:  -Group instruction provided by verbal instruction and written materials to support subject.  Instructor reviews cardiac drug classes: antiplatelets, anticoagulants, beta blockers, and statins.  Instructor discusses reasons, side effects, and lifestyle considerations for each drug class.   CARDIAC REHAB PHASE II EXERCISE from 11/09/2017 in [place]  Date  10/19/17  Instruction Review Code  2- Demonstrated Understanding      Cardiac Drugs  II:  -Group instruction provided by verbal instruction and written materials to support subject.  Instructor reviews cardiac drug classes: angiotensin converting enzyme inhibitors (ACE-I), angiotensin II receptor blockers (ARBs), nitrates, and calcium channel blockers.  Instructor discusses reasons, side effects, and lifestyle considerations for each drug class.   Anatomy and Physiology of the Circulatory System:  Group verbal and written instruction and models provide basic cardiac anatomy and physiology, with the coronary electrical and arterial systems. Review of: AMI, Angina, Valve disease, Heart Failure, Peripheral Artery Disease, Cardiac Arrhythmia, Pacemakers, and the ICD.   CARDIAC REHAB PHASE II EXERCISE from 11/09/2017 in [place]  Date  11/09/17  Instruction Review Code  2- Demonstrated Understanding      Other Education:  -Group or individual verbal, written, or video instructions that support the educational goals of the cardiac rehab program.   Holiday Eating Survival Tips:  -Group instruction provided by PowerPoint slides, verbal discussion, and written materials to support subject matter. The instructor gives patients tips, tricks, and techniques to help them not only survive but enjoy the holidays despite the onslaught of food that accompanies the holidays.   Knowledge Questionnaire Score: Knowledge Questionnaire Score - 09/29/17 1143      Knowledge Questionnaire Score   Pre Score  19/24       Core Components/Risk Factors/Patient Goals at Admission: Personal Goals and Risk Factors at Admission - 09/29/17 1152      Core Components/Risk Factors/Patient Goals on Admission    Weight Management  Yes;Weight Maintenance    Intervention  Weight Management: Develop a combined nutrition and exercise program designed to reach desired caloric intake, while maintaining appropriate intake of nutrient and fiber, sodium and fats, and appropriate  energy expenditure required for the weight goal.;Weight Management: Provide education and appropriate resources to help participant work on and attain dietary goals.;Weight Management/Obesity: Establish reasonable short term and long term weight goals.    Admit Weight  169 lb 5 oz (76.8 kg)    Goal Weight: Short Term  169 lb (76.7 kg)    Goal Weight: Long Term  169 lb (76.7 kg)    Expected Outcomes  Short Term: Continue to assess and modify interventions until short term weight is achieved;Long Term: Adherence to nutrition and physical activity/exercise program aimed toward attainment of established weight goal;Weight Maintenance: Understanding of the daily nutrition guidelines, which includes 25-35% calories from fat, 7% or less cal from saturated fats, less than 200mg  cholesterol, less than 1.5gm of sodium, & 5 or more servings of fruits and vegetables daily;Understanding recommendations for meals to include 15-35% energy as protein, 25-35% energy from fat, 35-60% energy from carbohydrates, less than 200mg  of dietary cholesterol, 20-35 gm of total fiber daily;Understanding of distribution of calorie intake throughout the day with the consumption of 4-5 meals/snacks    Hypertension  Yes    Intervention  Provide education on lifestyle  modifcations including regular physical activity/exercise, weight management, moderate sodium restriction and increased consumption of fresh fruit, vegetables, and low fat dairy, alcohol moderation, and smoking cessation.;Monitor prescription use compliance.    Expected Outcomes  Short Term: Continued assessment and intervention until BP is < 140/75mm HG in hypertensive participants. < 130/11mm HG in hypertensive participants with diabetes, heart failure or chronic kidney disease.;Long Term: Maintenance of blood pressure at goal levels.    Lipids  Yes    Intervention  Provide education and support for participant on nutrition & aerobic/resistive exercise along with prescribed  medications to achieve LDL 70mg , HDL >40mg .    Expected Outcomes  Short Term: Participant states understanding of desired cholesterol values and is compliant with medications prescribed. Participant is following exercise prescription and nutrition guidelines.;Long Term: Cholesterol controlled with medications as prescribed, with individualized exercise RX and with personalized nutrition plan. Value goals: LDL < 70mg , HDL > 40 mg.       Core Components/Risk Factors/Patient Goals Review:  Goals and Risk Factor Review    Row Name 10/20/17 1023 11/15/17 1437           Core Components/Risk Factors/Patient Goals Review   Personal Goals Review  Weight Management/Obesity;Hypertension;Lipids  Weight Management/Obesity;Hypertension;Lipids      Review  Salatino's vital signs and weights have been stable at cardiac rehab. Jakob is doing well with exercise.  Sparrow's vital signs and weights have been stable at cardiac rehab. Frazer is doing well with exercise.      Expected Outcomes  Patient will continue to partcipate in phase 2 cardiac rehab, follow a heart healthy diet and make lifestyle modifications.  Patient will continue to partcipate in phase 2 cardiac rehab, follow a heart healthy diet and make lifestyle modifications.         Core Components/Risk Factors/Patient Goals at Discharge (Final Review):  Goals and Risk Factor Review - 11/15/17 1437      Core Components/Risk Factors/Patient Goals Review   Personal Goals Review  Weight Management/Obesity;Hypertension;Lipids    Review  Leather's vital signs and weights have been stable at cardiac rehab. Borgen is doing well with exercise.    Expected Outcomes  Patient will continue to partcipate in phase 2 cardiac rehab, follow a heart healthy diet and make lifestyle modifications.       ITP Comments: ITP Comments    Row Name 09/27/17 1432 10/20/17 1009 11/15/17 1437       ITP Comments  Dr. Fransico Him, Medical Director  30 Day ITP Review.  Patient with good participation and attendance at phase 2 cardiac rehab.  30 Day ITP Review. Patient with good participation and attendance at phase 2 cardiac rehab.        Comments: See ITP comments.Barnet Pall, RN,BSN 11/15/2017 2:40 PM

## 2017-11-16 ENCOUNTER — Encounter (HOSPITAL_COMMUNITY): Payer: Medicare Other

## 2017-11-16 ENCOUNTER — Encounter (HOSPITAL_COMMUNITY)
Admission: RE | Admit: 2017-11-16 | Discharge: 2017-11-16 | Disposition: A | Discharge status: Home / Self Care | Payer: Medicare Other | Source: Ambulatory Visit | Attending: Cardiology | Admitting: Cardiology

## 2017-11-16 DIAGNOSIS — Z951 Presence of aortocoronary bypass graft: Secondary | ICD-10-CM | POA: Diagnosis not present

## 2017-11-16 DIAGNOSIS — M1990 Unspecified osteoarthritis, unspecified site: Secondary | ICD-10-CM | POA: Diagnosis not present

## 2017-11-16 DIAGNOSIS — Z7982 Long term (current) use of aspirin: Secondary | ICD-10-CM | POA: Diagnosis not present

## 2017-11-16 DIAGNOSIS — Z7901 Long term (current) use of anticoagulants: Secondary | ICD-10-CM | POA: Diagnosis not present

## 2017-11-16 DIAGNOSIS — Z79899 Other long term (current) drug therapy: Secondary | ICD-10-CM | POA: Diagnosis not present

## 2017-11-16 DIAGNOSIS — I10 Essential (primary) hypertension: Secondary | ICD-10-CM | POA: Diagnosis not present

## 2017-11-18 ENCOUNTER — Encounter (HOSPITAL_COMMUNITY)
Admission: RE | Admit: 2017-11-18 | Discharge: 2017-11-18 | Disposition: A | Discharge status: Home / Self Care | Payer: Medicare Other | Source: Ambulatory Visit | Attending: Cardiology | Admitting: Cardiology

## 2017-11-18 ENCOUNTER — Encounter (HOSPITAL_COMMUNITY): Payer: Medicare Other

## 2017-11-18 DIAGNOSIS — M1990 Unspecified osteoarthritis, unspecified site: Secondary | ICD-10-CM | POA: Diagnosis not present

## 2017-11-18 DIAGNOSIS — Z7982 Long term (current) use of aspirin: Secondary | ICD-10-CM | POA: Diagnosis not present

## 2017-11-18 DIAGNOSIS — Z79899 Other long term (current) drug therapy: Secondary | ICD-10-CM | POA: Diagnosis not present

## 2017-11-18 DIAGNOSIS — Z951 Presence of aortocoronary bypass graft: Secondary | ICD-10-CM | POA: Diagnosis not present

## 2017-11-18 DIAGNOSIS — Z7901 Long term (current) use of anticoagulants: Secondary | ICD-10-CM | POA: Diagnosis not present

## 2017-11-18 DIAGNOSIS — I10 Essential (primary) hypertension: Secondary | ICD-10-CM | POA: Diagnosis not present

## 2017-11-21 ENCOUNTER — Encounter (HOSPITAL_COMMUNITY)
Admission: RE | Admit: 2017-11-21 | Discharge: 2017-11-21 | Disposition: A | Discharge status: Home / Self Care | Payer: Medicare Other | Source: Ambulatory Visit | Attending: Cardiology | Admitting: Cardiology

## 2017-11-21 ENCOUNTER — Encounter (HOSPITAL_COMMUNITY): Payer: Medicare Other

## 2017-11-21 DIAGNOSIS — Z7901 Long term (current) use of anticoagulants: Secondary | ICD-10-CM | POA: Diagnosis not present

## 2017-11-21 DIAGNOSIS — Z79899 Other long term (current) drug therapy: Secondary | ICD-10-CM | POA: Diagnosis not present

## 2017-11-21 DIAGNOSIS — Z951 Presence of aortocoronary bypass graft: Secondary | ICD-10-CM

## 2017-11-21 DIAGNOSIS — Z7982 Long term (current) use of aspirin: Secondary | ICD-10-CM | POA: Diagnosis not present

## 2017-11-21 DIAGNOSIS — M1990 Unspecified osteoarthritis, unspecified site: Secondary | ICD-10-CM | POA: Diagnosis not present

## 2017-11-21 DIAGNOSIS — I10 Essential (primary) hypertension: Secondary | ICD-10-CM | POA: Diagnosis not present

## 2017-11-23 ENCOUNTER — Encounter (HOSPITAL_COMMUNITY)
Admission: RE | Admit: 2017-11-23 | Discharge: 2017-11-23 | Disposition: A | Discharge status: Home / Self Care | Payer: Medicare Other | Source: Ambulatory Visit | Attending: Cardiology | Admitting: Cardiology

## 2017-11-23 ENCOUNTER — Encounter (HOSPITAL_COMMUNITY): Payer: Medicare Other

## 2017-11-23 DIAGNOSIS — Z951 Presence of aortocoronary bypass graft: Secondary | ICD-10-CM

## 2017-11-23 DIAGNOSIS — Z79899 Other long term (current) drug therapy: Secondary | ICD-10-CM | POA: Diagnosis not present

## 2017-11-23 DIAGNOSIS — Z7901 Long term (current) use of anticoagulants: Secondary | ICD-10-CM | POA: Diagnosis not present

## 2017-11-23 DIAGNOSIS — M1990 Unspecified osteoarthritis, unspecified site: Secondary | ICD-10-CM | POA: Diagnosis not present

## 2017-11-23 DIAGNOSIS — Z7982 Long term (current) use of aspirin: Secondary | ICD-10-CM | POA: Diagnosis not present

## 2017-11-23 DIAGNOSIS — I10 Essential (primary) hypertension: Secondary | ICD-10-CM | POA: Diagnosis not present

## 2017-11-25 ENCOUNTER — Encounter (HOSPITAL_COMMUNITY): Payer: Medicare Other

## 2017-11-25 ENCOUNTER — Encounter (HOSPITAL_COMMUNITY)
Admission: RE | Admit: 2017-11-25 | Discharge: 2017-11-25 | Disposition: A | Discharge status: Home / Self Care | Payer: Medicare Other | Source: Ambulatory Visit | Attending: Cardiology | Admitting: Cardiology

## 2017-11-25 DIAGNOSIS — Z79899 Other long term (current) drug therapy: Secondary | ICD-10-CM | POA: Diagnosis not present

## 2017-11-25 DIAGNOSIS — Z951 Presence of aortocoronary bypass graft: Secondary | ICD-10-CM | POA: Diagnosis not present

## 2017-11-25 DIAGNOSIS — Z7901 Long term (current) use of anticoagulants: Secondary | ICD-10-CM | POA: Diagnosis not present

## 2017-11-25 DIAGNOSIS — M1990 Unspecified osteoarthritis, unspecified site: Secondary | ICD-10-CM | POA: Diagnosis not present

## 2017-11-25 DIAGNOSIS — Z7982 Long term (current) use of aspirin: Secondary | ICD-10-CM | POA: Diagnosis not present

## 2017-11-25 DIAGNOSIS — I10 Essential (primary) hypertension: Secondary | ICD-10-CM | POA: Diagnosis not present

## 2017-11-28 ENCOUNTER — Encounter (HOSPITAL_COMMUNITY): Payer: Medicare Other

## 2017-11-28 ENCOUNTER — Encounter (HOSPITAL_COMMUNITY)
Admission: RE | Admit: 2017-11-28 | Discharge: 2017-11-28 | Disposition: A | Discharge status: Home / Self Care | Payer: Medicare Other | Source: Ambulatory Visit | Attending: Cardiology | Admitting: Cardiology

## 2017-11-28 DIAGNOSIS — Z7982 Long term (current) use of aspirin: Secondary | ICD-10-CM | POA: Diagnosis not present

## 2017-11-28 DIAGNOSIS — Z7901 Long term (current) use of anticoagulants: Secondary | ICD-10-CM | POA: Diagnosis not present

## 2017-11-28 DIAGNOSIS — M1990 Unspecified osteoarthritis, unspecified site: Secondary | ICD-10-CM | POA: Diagnosis not present

## 2017-11-28 DIAGNOSIS — I10 Essential (primary) hypertension: Secondary | ICD-10-CM | POA: Diagnosis not present

## 2017-11-28 DIAGNOSIS — Z79899 Other long term (current) drug therapy: Secondary | ICD-10-CM | POA: Diagnosis not present

## 2017-11-28 DIAGNOSIS — Z951 Presence of aortocoronary bypass graft: Secondary | ICD-10-CM | POA: Diagnosis not present

## 2017-11-30 ENCOUNTER — Encounter (HOSPITAL_COMMUNITY)
Admission: RE | Admit: 2017-11-30 | Discharge: 2017-11-30 | Disposition: A | Discharge status: Home / Self Care | Payer: Medicare Other | Source: Ambulatory Visit | Attending: Cardiology | Admitting: Cardiology

## 2017-11-30 ENCOUNTER — Encounter (HOSPITAL_COMMUNITY): Payer: Medicare Other

## 2017-11-30 DIAGNOSIS — I10 Essential (primary) hypertension: Secondary | ICD-10-CM | POA: Diagnosis not present

## 2017-11-30 DIAGNOSIS — Z7901 Long term (current) use of anticoagulants: Secondary | ICD-10-CM | POA: Diagnosis not present

## 2017-11-30 DIAGNOSIS — Z7982 Long term (current) use of aspirin: Secondary | ICD-10-CM | POA: Diagnosis not present

## 2017-11-30 DIAGNOSIS — Z951 Presence of aortocoronary bypass graft: Secondary | ICD-10-CM | POA: Diagnosis not present

## 2017-11-30 DIAGNOSIS — Z79899 Other long term (current) drug therapy: Secondary | ICD-10-CM | POA: Diagnosis not present

## 2017-11-30 DIAGNOSIS — M1990 Unspecified osteoarthritis, unspecified site: Secondary | ICD-10-CM | POA: Diagnosis not present

## 2017-12-02 ENCOUNTER — Encounter (HOSPITAL_COMMUNITY): Payer: Medicare Other

## 2017-12-05 ENCOUNTER — Encounter (HOSPITAL_COMMUNITY)
Admission: RE | Admit: 2017-12-05 | Discharge: 2017-12-05 | Disposition: A | Discharge status: Home / Self Care | Payer: Medicare Other | Source: Ambulatory Visit | Attending: Cardiology | Admitting: Cardiology

## 2017-12-05 ENCOUNTER — Encounter (HOSPITAL_COMMUNITY): Payer: Medicare Other

## 2017-12-05 DIAGNOSIS — Z7901 Long term (current) use of anticoagulants: Secondary | ICD-10-CM | POA: Diagnosis not present

## 2017-12-05 DIAGNOSIS — Z79899 Other long term (current) drug therapy: Secondary | ICD-10-CM | POA: Diagnosis not present

## 2017-12-05 DIAGNOSIS — Z7982 Long term (current) use of aspirin: Secondary | ICD-10-CM | POA: Diagnosis not present

## 2017-12-05 DIAGNOSIS — Z951 Presence of aortocoronary bypass graft: Secondary | ICD-10-CM

## 2017-12-05 DIAGNOSIS — M1990 Unspecified osteoarthritis, unspecified site: Secondary | ICD-10-CM | POA: Diagnosis not present

## 2017-12-05 DIAGNOSIS — I10 Essential (primary) hypertension: Secondary | ICD-10-CM | POA: Diagnosis not present

## 2017-12-07 ENCOUNTER — Encounter (HOSPITAL_COMMUNITY)
Admission: RE | Admit: 2017-12-07 | Discharge: 2017-12-07 | Disposition: A | Discharge status: Home / Self Care | Payer: Medicare Other | Source: Ambulatory Visit | Attending: Cardiology | Admitting: Cardiology

## 2017-12-07 ENCOUNTER — Encounter (HOSPITAL_COMMUNITY): Payer: Medicare Other

## 2017-12-07 DIAGNOSIS — Z951 Presence of aortocoronary bypass graft: Secondary | ICD-10-CM | POA: Diagnosis not present

## 2017-12-07 DIAGNOSIS — I10 Essential (primary) hypertension: Secondary | ICD-10-CM | POA: Diagnosis not present

## 2017-12-07 DIAGNOSIS — Z79899 Other long term (current) drug therapy: Secondary | ICD-10-CM | POA: Diagnosis not present

## 2017-12-07 DIAGNOSIS — M1990 Unspecified osteoarthritis, unspecified site: Secondary | ICD-10-CM | POA: Diagnosis not present

## 2017-12-07 DIAGNOSIS — Z7901 Long term (current) use of anticoagulants: Secondary | ICD-10-CM | POA: Diagnosis not present

## 2017-12-07 DIAGNOSIS — Z7982 Long term (current) use of aspirin: Secondary | ICD-10-CM | POA: Diagnosis not present

## 2017-12-09 ENCOUNTER — Encounter (HOSPITAL_COMMUNITY)
Admission: RE | Admit: 2017-12-09 | Discharge: 2017-12-09 | Disposition: A | Discharge status: Home / Self Care | Payer: Medicare Other | Source: Ambulatory Visit | Attending: Cardiology | Admitting: Cardiology

## 2017-12-09 ENCOUNTER — Encounter (HOSPITAL_COMMUNITY): Payer: Medicare Other

## 2017-12-09 DIAGNOSIS — R55 Syncope and collapse: Secondary | ICD-10-CM | POA: Diagnosis not present

## 2017-12-09 DIAGNOSIS — R42 Dizziness and giddiness: Secondary | ICD-10-CM | POA: Diagnosis not present

## 2017-12-09 DIAGNOSIS — Z951 Presence of aortocoronary bypass graft: Secondary | ICD-10-CM

## 2017-12-09 NOTE — Progress Notes (Signed)
Incomplete Session Note  Patient Details  Name: Patrick Nguyen MRN: [id number] Date of Birth: July 24, 1940 Referring Provider:     Ida from 09/29/2017 in [place]  Referring Provider  Minus Breeding MD      [name] did not complete his rehab session.  Pt reported symptoms of lightheaded/dizzy and feeling as if his balance was off.  Symptoms started last night after he had gone to the bed in the middle of the night.  Orthostatic Vitals obtained and were the following: Lying 144/69 HR 52, Sitting 135/84 HR 55, Standing 133/67 HR 57, and Standing for two mins 133/67 HR 57. PCP called and appointment scheduled for 12/09/17 at 2:45.  [name]'s wife is to take him to appointment with reported symptoms.

## 2017-12-14 ENCOUNTER — Encounter (HOSPITAL_COMMUNITY): Payer: Medicare Other

## 2017-12-14 ENCOUNTER — Encounter (HOSPITAL_COMMUNITY)
Admission: RE | Admit: 2017-12-14 | Discharge: 2017-12-14 | Disposition: A | Discharge status: Home / Self Care | Payer: Medicare Other | Source: Ambulatory Visit | Attending: Cardiology | Admitting: Cardiology

## 2017-12-14 DIAGNOSIS — I10 Essential (primary) hypertension: Secondary | ICD-10-CM | POA: Diagnosis not present

## 2017-12-14 DIAGNOSIS — M1990 Unspecified osteoarthritis, unspecified site: Secondary | ICD-10-CM | POA: Diagnosis not present

## 2017-12-14 DIAGNOSIS — Z79899 Other long term (current) drug therapy: Secondary | ICD-10-CM | POA: Insufficient documentation

## 2017-12-14 DIAGNOSIS — Z951 Presence of aortocoronary bypass graft: Secondary | ICD-10-CM

## 2017-12-14 DIAGNOSIS — E785 Hyperlipidemia, unspecified: Secondary | ICD-10-CM | POA: Diagnosis not present

## 2017-12-14 DIAGNOSIS — Z7982 Long term (current) use of aspirin: Secondary | ICD-10-CM | POA: Diagnosis not present

## 2017-12-14 DIAGNOSIS — Z87891 Personal history of nicotine dependence: Secondary | ICD-10-CM | POA: Insufficient documentation

## 2017-12-14 DIAGNOSIS — I219 Acute myocardial infarction, unspecified: Secondary | ICD-10-CM | POA: Insufficient documentation

## 2017-12-14 DIAGNOSIS — Z7901 Long term (current) use of anticoagulants: Secondary | ICD-10-CM | POA: Insufficient documentation

## 2017-12-15 NOTE — Progress Notes (Signed)
Cardiac Individual Treatment Plan  Patient Details  Name: Patrick Nguyen MRN: [id number] Date of Birth: 1940/08/26 Referring Provider:     CARDIAC REHAB PHASE II ORIENTATION from 09/29/2017 in [place]  Referring Provider  Minus Breeding MD      Initial Encounter Date:    CARDIAC REHAB PHASE II ORIENTATION from 09/29/2017 in [place]  Date  09/29/17      Visit Diagnosis: S/P CABG (coronary artery bypass graft)  Patient's Home Medications on Admission:  Current Outpatient Medications:  .  aspirin EC 81 MG tablet, Take 81 mg by mouth daily., Disp: , Rfl:  .  lisinopril (PRINIVIL,ZESTRIL) 2.5 MG tablet, Take 2.5 mg by mouth daily., Disp: , Rfl:  .  metoprolol tartrate (LOPRESSOR) 25 MG tablet, TAKE 1 TABLET(25 MG) BY MOUTH TWICE DAILY, Disp: 60 tablet, Rfl: 3 .  rosuvastatin (CRESTOR) 20 MG tablet, Take 1 tablet (20 mg total) by mouth every evening., Disp: 30 tablet, Rfl: 2  Past Medical History: Past Medical History:  Diagnosis Date  . Anginal pain ([place]) 05/13/2017  . Arthritis   . CAD (coronary artery disease) 05/13/2017   s/p CABG w/ / LIMA-LAD, SVG-OM, SVG-PDA  . Dyspnea    w/ exertion   . Elevated coronary artery calcium score   . GERD (gastroesophageal reflux disease)    hx  . Hepatitis    in   high  school    . Hyperlipidemia   . Hypertension   . Myocardial infarction ([place])   . Rosacea     Tobacco Use: Social History   Tobacco Use  Smoking Status Former Smoker  . Last attempt to quit: 05/13/1986  . Years since quitting: 31.6  Smokeless Tobacco Never Used    Labs: Recent Review Flowsheet Data    Labs for ITP Cardiac and Pulmonary Rehab Latest Ref Rng & Units 06/07/2017 06/07/2017 06/08/2017 06/09/2017 09/26/2017   Cholestrol 100 - 199 mg/dL - - - - 134   LDLCALC 0 - 99 mg/dL - - - - 49   HDL >39 mg/dL - - - - 70   Trlycerides 0 - 149 mg/dL - - - - 73   Hemoglobin A1c 4.8 - 5.6 % - - - - -    PHART 7.350 - 7.450 7.298(L) - - - -   PCO2ART 32.0 - 48.0 mmHg 45.7 - - - -   HCO3 20.0 - 28.0 mmol/L 22.4 - - - -   TCO2 22 - 32 mmol/L 24 23 25 26  -   ACIDBASEDEF 0.0 - 2.0 mmol/L 4.0(H) - - - -   O2SAT % 95.0 - - 64.5 -      Capillary Blood Glucose: Lab Results  Component Value Date   GLUCAP 102 (H) 06/11/2017   GLUCAP 93 06/11/2017   GLUCAP 83 06/11/2017   GLUCAP 78 06/10/2017   GLUCAP 94 06/10/2017     Exercise Target Goals: Exercise Program Goal: Individual exercise prescription set using results from initial 6 min walk test and THRR while considering  patient's activity barriers and safety.   Exercise Prescription Goal: Initial exercise prescription builds to 30-45 minutes a day of aerobic activity, 2-3 days per week.  Home exercise guidelines will be given to patient during program as part of exercise prescription that the participant will acknowledge.  Activity Barriers & Risk Stratification: Activity Barriers & Cardiac Risk Stratification - 09/29/17 0917      Activity Barriers & Cardiac Risk Stratification  Activity Barriers  Deconditioning;Muscular Weakness;Other (comment);Arthritis    Comments  L RTC repair, R shoulder pain and limitations    Cardiac Risk Stratification  High       6 Minute Walk: 6 Minute Walk    Row Name 09/29/17 1144         6 Minute Walk   Phase  Initial     Distance  1779 feet     Walk Time  6 minutes     # of Rest Breaks  0     MPH  3.4     METS  3.4     RPE  11     VO2 Peak  11.96     Symptoms  No     Resting HR  60 bpm     Resting BP  118/60     Resting Oxygen Saturation   95 %     Exercise Oxygen Saturation  during 6 min walk  96 %     Max Ex. HR  83 bpm     Max Ex. BP  120/80     2 Minute Post BP  126/68        Oxygen Initial Assessment:   Oxygen Re-Evaluation:   Oxygen Discharge (Final Oxygen Re-Evaluation):   Initial Exercise Prescription: Initial Exercise Prescription - 09/29/17 1100      Date of  Initial Exercise RX and Referring Provider   Date  09/29/17    Referring Provider  Minus Breeding MD      Treadmill   MPH  3    Grade  0    Minutes  10    METs  3.3      Bike   Level  1    Minutes  10    METs  3.4      NuStep   Level  3    SPM  80    Minutes  10    METs  3      Prescription Details   Frequency (times per week)  3    Duration  Progress to 30 minutes of continuous aerobic without signs/symptoms of physical distress      Intensity   THRR 40-80% of Max Heartrate  58-116    Ratings of Perceived Exertion  11-13    Perceived Dyspnea  0-4      Progression   Progression  Continue to progress workloads to maintain intensity without signs/symptoms of physical distress.      Resistance Training   Training Prescription  Yes    Weight  3lbs    Reps  10-15       Perform Capillary Blood Glucose checks as needed.  Exercise Prescription Changes: Exercise Prescription Changes    Row Name 10/05/17 0955 10/10/17 0948 10/24/17 0952 11/09/17 0948 11/21/17 0951     Response to Exercise   Blood Pressure (Admit)  112/60  120/70  120/70  116/68  130/78   Blood Pressure (Exercise)  142/84  128/70  132/80  138/80  160/80   Blood Pressure (Exit)  118/62  134/68  130/78  104/62  128/80   Heart Rate (Admit)  52 bpm  60 bpm  64 bpm  59 bpm  57 bpm   Heart Rate (Exercise)  98 bpm  105 bpm  111 bpm  117 bpm  105 bpm   Heart Rate (Exit)  60 bpm  58 bpm  64 bpm  60 bpm  65 bpm   Rating of Perceived  Exertion (Exercise)  12  13  13  13  13    Symptoms  none  none  none  none  none   Duration  Progress to 30 minutes of  aerobic without signs/symptoms of physical distress  Progress to 30 minutes of  aerobic without signs/symptoms of physical distress  Continue with 30 min of aerobic exercise without signs/symptoms of physical distress.  Continue with 30 min of aerobic exercise without signs/symptoms of physical distress.  Continue with 30 min of aerobic exercise without signs/symptoms  of physical distress.   Intensity  THRR unchanged  THRR unchanged  THRR unchanged  THRR unchanged  THRR unchanged     Progression   Progression  Continue to progress workloads to maintain intensity without signs/symptoms of physical distress.  Continue to progress workloads to maintain intensity without signs/symptoms of physical distress.  Continue to progress workloads to maintain intensity without signs/symptoms of physical distress.  Continue to progress workloads to maintain intensity without signs/symptoms of physical distress.  Continue to progress workloads to maintain intensity without signs/symptoms of physical distress.   Average METs  3.1  4  4.1  5.1  5.1     Resistance Training   Training Prescription  No Relaxation day, no weights today.  Yes  Yes  No Relaxation day, no weights.  Yes   Weight  -  3lbs  4lbs  -  6lbs   Reps  -  10-15  10-15  -  10-15   Time  -  10 Minutes  10 Minutes  -  10 Minutes     Interval Training   Interval Training  No  No  No  Yes  -   Equipment  -  -  -  Treadmill  -   Comments  -  -  -  Patient walks 3.0/4 for 8 minutes and 3.3/4 for 2 minutes.  Patient walks 3.0/4 for 8 minutes and 3.3/4 for 2 minutes.     Treadmill   MPH  3  3  3  3  3.3 mph for 2 minutes  3 3.3 mph for 2 minutes   Grade  0  3  3  4  4    Minutes  10  10  10  10  10    METs  3.3  4.54  4.54  4.95 5.35 METs for 2 minutes  4.95 5.35 METs for 2 minutes     Bike   Level  1  1.4  1.4  2.1  2.1   Minutes  10  10  10  10  10    METs  3.5  4.51  4.47  6.27  6.27     NuStep   Level  3  3  4  5  5    SPM  80  80  80  80  80   Minutes  10  10  10  10  10    METs  2.4  3  3.4  3.9  3.9     Home Exercise Plan   Plans to continue exercise at  -  -  Home (comment)  Home (comment)  Home (comment)   Frequency  -  -  Add 4 additional days to program exercise sessions.  Add 4 additional days to program exercise sessions.  Add 4 additional days to program exercise sessions.   Initial Home  Exercises Provided  -  -  10/12/17  10/12/17  10/12/17   Row Name 12/05/17 [phone]  Response to Exercise   Blood Pressure (Admit)  118/60       Blood Pressure (Exercise)  160/72       Blood Pressure (Exit)  112/64       Heart Rate (Admit)  60 bpm       Heart Rate (Exercise)  104 bpm       Heart Rate (Exit)  59 bpm       Rating of Perceived Exertion (Exercise)  12       Symptoms  none       Duration  Continue with 30 min of aerobic exercise without signs/symptoms of physical distress.       Intensity  THRR unchanged         Progression   Progression  Continue to progress workloads to maintain intensity without signs/symptoms of physical distress.       Average METs  5.3         Resistance Training   Training Prescription  Yes       Weight  6lbs       Reps  10-15       Time  10 Minutes         Interval Training   Interval Training  Yes       Equipment  Treadmill       Comments  Patient walks 3.0/4 for 7 minutes and 3.3/4 for 3 minutes.         Treadmill   MPH  3 3.3 mph for 3 minutes       Grade  4       Minutes  10       METs  4.95 5.35 METs for 3 minutes         Bike   Level  2.1       Minutes  10       METs  6.28         NuStep   Level  6       SPM  85       Minutes  10       METs  4.7         Home Exercise Plan   Plans to continue exercise at  Home (comment)       Frequency  Add 4 additional days to program exercise sessions.       Initial Home Exercises Provided  10/12/17          Exercise Comments: Exercise Comments    Row Name 10/05/17 1045 10/10/17 1049 10/12/17 1013 10/24/17 1013 11/09/17 1020   Exercise Comments  Patient tolerated exercise well without c/o.  Reviewed METs and goals with patient.   Reviewed home exercise guidelines with patient.   Reviewed METs with patient.   Reviewed METs and goals with patient.    [place] Name 11/21/17 1028 12/05/17 1015         Exercise Comments  Reviewed METs with patient.   Reviewed METs and goals  with patient.          Exercise Goals and Review: Exercise Goals    Row Name 09/29/17 0918             Exercise Goals   Increase Physical Activity  Yes       Intervention  Provide advice, education, support and counseling about physical activity/exercise needs.;Develop an individualized exercise prescription for aerobic and resistive training based on initial evaluation findings, risk stratification, comorbidities and participant's personal goals.  Expected Outcomes  Short Term: Attend rehab on a regular basis to increase amount of physical activity.;Long Term: Exercising regularly at least 3-5 days a week.;Long Term: Add in home exercise to make exercise part of routine and to increase amount of physical activity.       Increase Strength and Stamina  Yes       Intervention  Provide advice, education, support and counseling about physical activity/exercise needs.;Develop an individualized exercise prescription for aerobic and resistive training based on initial evaluation findings, risk stratification, comorbidities and participant's personal goals.       Expected Outcomes  Short Term: Increase workloads from initial exercise prescription for resistance, speed, and METs.;Short Term: Perform resistance training exercises routinely during rehab and add in resistance training at home;Long Term: Improve cardiorespiratory fitness, muscular endurance and strength as measured by increased METs and functional capacity (6MWT)       Able to understand and use rate of perceived exertion (RPE) scale  Yes       Intervention  Provide education and explanation on how to use RPE scale       Expected Outcomes  Short Term: Able to use RPE daily in rehab to express subjective intensity level;Long Term:  Able to use RPE to guide intensity level when exercising independently       Knowledge and understanding of Target Heart Rate Range (THRR)  Yes       Intervention  Provide education and explanation of THRR  including how the numbers were predicted and where they are located for reference       Expected Outcomes  Short Term: Able to state/look up THRR;Long Term: Able to use THRR to govern intensity when exercising independently;Short Term: Able to use daily as guideline for intensity in rehab       Able to check pulse independently  Yes       Intervention  Provide education and demonstration on how to check pulse in carotid and radial arteries.;Review the importance of being able to check your own pulse for safety during independent exercise       Expected Outcomes  Short Term: Able to explain why pulse checking is important during independent exercise;Long Term: Able to check pulse independently and accurately       Understanding of Exercise Prescription  Yes       Intervention  Provide education, explanation, and written materials on patient's individual exercise prescription       Expected Outcomes  Short Term: Able to explain program exercise prescription;Long Term: Able to explain home exercise prescription to exercise independently          Exercise Goals Re-Evaluation : Exercise Goals Re-Evaluation    Row Name 10/05/17 1045 10/10/17 1049 10/12/17 1013 11/09/17 1020 12/05/17 1015     Exercise Goal Re-Evaluation   Exercise Goals Review  Able to understand and use rate of perceived exertion (RPE) scale  Able to understand and use rate of perceived exertion (RPE) scale;Increase Physical Activity  Able to understand and use rate of perceived exertion (RPE) scale;Understanding of Exercise Prescription;Knowledge and understanding of Target Heart Rate Range (THRR);Able to check pulse independently  Understanding of Exercise Prescription  Understanding of Exercise Prescription   Comments  Patient able to understand and use RPE scale appropriately.  Patient is walking 20-25 minutes, 4 days/week in addition to his exercise at [facility].   Reviewed home exercise guidelines with patient including THRR, RPE scale,  and endpoints for exercise. Instructed patient on how to count  pulse and pt was able to accurately obtain pulse. Pt is walking and has a treadmill at home. Pt also has a total gym at home, which he plans to use pending clearance from physician.  Patient is walking 20-30 minutes daily as his mode of home exercise. Pt is not using total gym at home at this time because of shoulder pains. Making excellent progress with exercise at [facility].  Pt continues to make excellent progress with exercise and states that he "feels a whole lot better than before". Pt will try to stretch more consistently with home exercise routine to help achieve goal of improving flexibility.   Expected Outcomes  Patient will exercise at least 30 minutes, 5-7 days/week to help achieve health and fitness goals.  Patient will increase walking duration at home from 20-25 minutes to 30 minutes 4 days/week to help improve stamina and cardiorespiratory fitness.  Patient will walk outdoors or on treadmill at home 3-4 days/week increasing duration to 30 minutes.  Continue current exercise routine to help achieve personal health and fitness goals.  Patient will incorporate stretching more consistently in home exercise routine to help improve flexibilty.       Discharge Exercise Prescription (Final Exercise Prescription Changes): Exercise Prescription Changes - 12/05/17 0947      Response to Exercise   Blood Pressure (Admit)  118/60    Blood Pressure (Exercise)  160/72    Blood Pressure (Exit)  112/64    Heart Rate (Admit)  60 bpm    Heart Rate (Exercise)  104 bpm    Heart Rate (Exit)  59 bpm    Rating of Perceived Exertion (Exercise)  12    Symptoms  none    Duration  Continue with 30 min of aerobic exercise without signs/symptoms of physical distress.    Intensity  THRR unchanged      Progression   Progression  Continue to progress workloads to maintain intensity without signs/symptoms of physical distress.    Average METs  5.3       Resistance Training   Training Prescription  Yes    Weight  6lbs    Reps  10-15    Time  10 Minutes      Interval Training   Interval Training  Yes    Equipment  Treadmill    Comments  Patient walks 3.0/4 for 7 minutes and 3.3/4 for 3 minutes.      Treadmill   MPH  3   3.3 mph for 3 minutes   Grade  4    Minutes  10    METs  4.95   5.35 METs for 3 minutes     Bike   Level  2.1    Minutes  10    METs  6.28      NuStep   Level  6    SPM  85    Minutes  10    METs  4.7      Home Exercise Plan   Plans to continue exercise at  Home (comment)    Frequency  Add 4 additional days to program exercise sessions.    Initial Home Exercises Provided  10/12/17       Nutrition:  Target Goals: Understanding of nutrition guidelines, daily intake of sodium 1500mg , cholesterol 200mg , calories 30% from fat and 7% or less from saturated fats, daily to have 5 or more servings of fruits and vegetables.  Biometrics: Pre Biometrics - 09/29/17 1152      Pre  Biometrics   Height  5\' 9"  (1.753 m)    Weight  76.8 kg    Waist Circumference  39.25 inches    Hip Circumference  38.25 inches    Waist to Hip Ratio  1.03 %    BMI (Calculated)  24.99    Triceps Skinfold  23 mm    % Body Fat  28.2 %    Grip Strength  41 kg    Flexibility  0 in    Single Leg Stand  12.84 seconds        Nutrition Therapy Plan and Nutrition Goals: Nutrition Therapy & Goals - 10/17/17 1034      Nutrition Therapy   Diet  heart healthy      Personal Nutrition Goals   Nutrition Goal  pt to identify and limit sources of saturated fat, trans fat, and sodium in diet      Intervention Plan   Intervention  Prescribe, educate and counsel regarding individualized specific dietary modifications aiming towards targeted core components such as weight, hypertension, lipid management, diabetes, heart failure and other comorbidities.    Expected Outcomes  Short Term Goal: Understand basic principles of dietary content,  such as calories, fat, sodium, cholesterol and nutrients.;Long Term Goal: Adherence to prescribed nutrition plan.       Nutrition Assessments: Nutrition Assessments - 09/29/17 1056      MEDFICTS Scores   Pre Score  12       Nutrition Goals Re-Evaluation: Nutrition Goals Re-Evaluation    Row Name 10/17/17 1035             Goals   Nutrition Goal  Pt to identify and limit sources of saturated fat, trans fat, and sodium in diet          Nutrition Goals Re-Evaluation: Nutrition Goals Re-Evaluation    [place] Name 10/17/17 1035             Goals   Nutrition Goal  Pt to identify and limit sources of saturated fat, trans fat, and sodium in diet          Nutrition Goals Discharge (Final Nutrition Goals Re-Evaluation): Nutrition Goals Re-Evaluation - 10/17/17 1035      Goals   Nutrition Goal  Pt to identify and limit sources of saturated fat, trans fat, and sodium in diet       Psychosocial: Target Goals: Acknowledge presence or absence of significant depression and/or stress, maximize coping skills, provide positive support system. Participant is able to verbalize types and ability to use techniques and skills needed for reducing stress and depression.  Initial Review & Psychosocial Screening: Initial Psych Review & Screening - 09/29/17 1221      Initial Review   Current issues with  None Identified      Family Dynamics   Good Support System?  Yes   Patrick Nguyen reports his wife as a source of support.     Barriers   Psychosocial barriers to participate in program  There are no identifiable barriers or psychosocial needs.      Screening Interventions   Interventions  Encouraged to exercise       Quality of Life Scores: Quality of Life - 09/29/17 1157      Quality of Life Scores   Health/Function Pre  28.53 %    Socioeconomic Pre  30 %    Psych/Spiritual Pre  28.93 %    Family Pre  28.8 %    GLOBAL Pre  28.92 %  Scores of 19 and below usually indicate a  poorer quality of life in these areas.  A difference of  2-3 points is a clinically meaningful difference.  A difference of 2-3 points in the total score of the Quality of Life Index has been associated with significant improvement in overall quality of life, self-image, physical symptoms, and general health in studies assessing change in quality of life.  PHQ-9: Recent Review Flowsheet Data    Depression screen [facility] 2/9 10/05/2017   Decreased Interest 0   Down, Depressed, Hopeless 0   PHQ - 2 Score 0     Interpretation of Total Score  Total Score Depression Severity:  1-4 = Minimal depression, 5-9 = Mild depression, 10-14 = Moderate depression, 15-19 = Moderately severe depression, 20-27 = Severe depression   Psychosocial Evaluation and Intervention:   Psychosocial Re-Evaluation: Psychosocial Re-Evaluation    [place] Name 10/20/17 1033 11/15/17 1438 12/15/17 1536         Psychosocial Re-Evaluation   Current issues with  None Identified  None Identified  None Identified     Interventions  Encouraged to attend Cardiac Rehabilitation for the exercise  Encouraged to attend Cardiac Rehabilitation for the exercise  Encouraged to attend Cardiac Rehabilitation for the exercise     Continue Psychosocial Services   No Follow up required  No Follow up required  No Follow up required        Psychosocial Discharge (Final Psychosocial Re-Evaluation): Psychosocial Re-Evaluation - 12/15/17 1536      Psychosocial Re-Evaluation   Current issues with  None Identified    Interventions  Encouraged to attend Cardiac Rehabilitation for the exercise    Continue Psychosocial Services   No Follow up required       Vocational Rehabilitation: Provide vocational rehab assistance to qualifying candidates.   Vocational Rehab Evaluation & Intervention: Vocational Rehab - 09/29/17 1221      Initial Vocational Rehab Evaluation & Intervention   Assessment shows need for Vocational Rehabilitation  No        Education: Education Goals: Education classes will be provided on a weekly basis, covering required topics. Participant will state understanding/return demonstration of topics presented.  Learning Barriers/Preferences: Learning Barriers/Preferences - 09/29/17 8032      Learning Barriers/Preferences   Learning Barriers  Sight    Learning Preferences  Written Material;Pictoral;Video       Education Topics: Count Your Pulse:  -Group instruction provided by verbal instruction, demonstration, patient participation and written materials to support subject.  Instructors address importance of being able to find your pulse and how to count your pulse when at home without a heart monitor.  Patients get hands on experience counting their pulse with staff help and individually.   Heart Attack, Angina, and Risk Factor Modification:  -Group instruction provided by verbal instruction, video, and written materials to support subject.  Instructors address signs and symptoms of angina and heart attacks.    Also discuss risk factors for heart disease and how to make changes to improve heart health risk factors.   CARDIAC REHAB PHASE II EXERCISE from 11/30/2017 in [place]  Date  11/16/17  Instruction Review Code  2- Demonstrated Understanding      Functional Fitness:  -Group instruction provided by verbal instruction, demonstration, patient participation, and written materials to support subject.  Instructors address safety measures for doing things around the house.  Discuss how to get up and down off the floor, how to pick things up  properly, how to safely get out of a chair without assistance, and balance training.   CARDIAC REHAB PHASE II EXERCISE from 11/30/2017 in [place]  Date  10/21/17  Instruction Review Code  2- Demonstrated Understanding      Meditation and Mindfulness:  -Group instruction provided by verbal  instruction, patient participation, and written materials to support subject.  Instructor addresses importance of mindfulness and meditation practice to help reduce stress and improve awareness.  Instructor also leads participants through a meditation exercise.    Stretching for Flexibility and Mobility:  -Group instruction provided by verbal instruction, patient participation, and written materials to support subject.  Instructors lead participants through series of stretches that are designed to increase flexibility thus improving mobility.  These stretches are additional exercise for major muscle groups that are typically performed during regular warm up and cool down.   Hands Only CPR:  -Group verbal, video, and participation provides a basic overview of AHA guidelines for community CPR. Role-play of emergencies allow participants the opportunity to practice calling for help and chest compression technique with discussion of AED use.   Hypertension: -Group verbal and written instruction that provides a basic overview of hypertension including the most recent diagnostic guidelines, risk factor reduction with self-care instructions and medication management.   CARDIAC REHAB PHASE II EXERCISE from 11/30/2017 in [place]  Date  10/28/17  Instruction Review Code  2- Demonstrated Understanding       Nutrition I class: Heart Healthy Eating:  -Group instruction provided by PowerPoint slides, verbal discussion, and written materials to support subject matter. The instructor gives an explanation and review of the Therapeutic Lifestyle Changes diet recommendations, which includes a discussion on lipid goals, dietary fat, sodium, fiber, plant stanol/sterol esters, sugar, and the components of a well-balanced, healthy diet.   Nutrition II class: Lifestyle Skills:  -Group instruction provided by PowerPoint slides, verbal discussion, and written materials to support  subject matter. The instructor gives an explanation and review of label reading, grocery shopping for heart health, heart healthy recipe modifications, and ways to make healthier choices when eating out.   Diabetes Question & Answer:  -Group instruction provided by PowerPoint slides, verbal discussion, and written materials to support subject matter. The instructor gives an explanation and review of diabetes co-morbidities, pre- and post-prandial blood glucose goals, pre-exercise blood glucose goals, signs, symptoms, and treatment of hypoglycemia and hyperglycemia, and foot care basics.   Diabetes Blitz:  -Group instruction provided by PowerPoint slides, verbal discussion, and written materials to support subject matter. The instructor gives an explanation and review of the physiology behind type 1 and type 2 diabetes, diabetes medications and rational behind using different medications, pre- and post-prandial blood glucose recommendations and Hemoglobin A1c goals, diabetes diet, and exercise including blood glucose guidelines for exercising safely.    Portion Distortion:  -Group instruction provided by PowerPoint slides, verbal discussion, written materials, and food models to support subject matter. The instructor gives an explanation of serving size versus portion size, changes in portions sizes over the last 20 years, and what consists of a serving from each food group.   Stress Management:  -Group instruction provided by verbal instruction, video, and written materials to support subject matter.  Instructors review role of stress in heart disease and how to cope with stress positively.     CARDIAC REHAB PHASE II EXERCISE from 11/30/2017 in [place]  Date  11/02/17  Educator  RN  Instruction Review Code  2- Demonstrated Understanding      Exercising on Your Own:  -Group instruction provided by verbal instruction, power point, and written materials to  support subject.  Instructors discuss benefits of exercise, components of exercise, frequency and intensity of exercise, and end points for exercise.  Also discuss use of nitroglycerin and activating EMS.  Review options of places to exercise outside of rehab.  Review guidelines for sex with heart disease.   CARDIAC REHAB PHASE II EXERCISE from 11/30/2017 in [place]  Date  11/30/17  Educator  EP  Instruction Review Code  2- Demonstrated Understanding      Cardiac Drugs I:  -Group instruction provided by verbal instruction and written materials to support subject.  Instructor reviews cardiac drug classes: antiplatelets, anticoagulants, beta blockers, and statins.  Instructor discusses reasons, side effects, and lifestyle considerations for each drug class.   CARDIAC REHAB PHASE II EXERCISE from 11/30/2017 in [place]  Date  10/19/17  Instruction Review Code  2- Demonstrated Understanding      Cardiac Drugs II:  -Group instruction provided by verbal instruction and written materials to support subject.  Instructor reviews cardiac drug classes: angiotensin converting enzyme inhibitors (ACE-I), angiotensin II receptor blockers (ARBs), nitrates, and calcium channel blockers.  Instructor discusses reasons, side effects, and lifestyle considerations for each drug class.   CARDIAC REHAB PHASE II EXERCISE from 11/30/2017 in [place]  Date  11/23/17  Instruction Review Code  2- Demonstrated Understanding      Anatomy and Physiology of the Circulatory System:  Group verbal and written instruction and models provide basic cardiac anatomy and physiology, with the coronary electrical and arterial systems. Review of: AMI, Angina, Valve disease, Heart Failure, Peripheral Artery Disease, Cardiac Arrhythmia, Pacemakers, and the ICD.   CARDIAC REHAB PHASE II EXERCISE from 11/30/2017 in [place]  Date  11/09/17  Instruction Review Code  2- Demonstrated Understanding      Other Education:  -Group or individual verbal, written, or video instructions that support the educational goals of the cardiac rehab program.   Holiday Eating Survival Tips:  -Group instruction provided by PowerPoint slides, verbal discussion, and written materials to support subject matter. The instructor gives patients tips, tricks, and techniques to help them not only survive but enjoy the holidays despite the onslaught of food that accompanies the holidays.   Knowledge Questionnaire Score: Knowledge Questionnaire Score - 09/29/17 1143      Knowledge Questionnaire Score   Pre Score  19/24       Core Components/Risk Factors/Patient Goals at Admission: Personal Goals and Risk Factors at Admission - 09/29/17 1152      Core Components/Risk Factors/Patient Goals on Admission    Weight Management  Yes;Weight Maintenance    Intervention  Weight Management: Develop a combined nutrition and exercise program designed to reach desired caloric intake, while maintaining appropriate intake of nutrient and fiber, sodium and fats, and appropriate energy expenditure required for the weight goal.;Weight Management: Provide education and appropriate resources to help participant work on and attain dietary goals.;Weight Management/Obesity: Establish reasonable short term and long term weight goals.    Admit Weight  169 lb 5 oz (76.8 kg)    Goal Weight: Short Term  169 lb (76.7 kg)    Goal Weight: Long Term  169 lb (76.7 kg)    Expected Outcomes  Short Term:  Continue to assess and modify interventions until short term weight is achieved;Long Term: Adherence to nutrition and physical activity/exercise program aimed toward attainment of established weight goal;Weight Maintenance: Understanding of the daily nutrition guidelines, which includes 25-35% calories from fat, 7% or less cal from saturated fats,  less than 200mg  cholesterol, less than 1.5gm of sodium, & 5 or more servings of fruits and vegetables daily;Understanding recommendations for meals to include 15-35% energy as protein, 25-35% energy from fat, 35-60% energy from carbohydrates, less than 200mg  of dietary cholesterol, 20-35 gm of total fiber daily;Understanding of distribution of calorie intake throughout the day with the consumption of 4-5 meals/snacks    Hypertension  Yes    Intervention  Provide education on lifestyle modifcations including regular physical activity/exercise, weight management, moderate sodium restriction and increased consumption of fresh fruit, vegetables, and low fat dairy, alcohol moderation, and smoking cessation.;Monitor prescription use compliance.    Expected Outcomes  Short Term: Continued assessment and intervention until BP is < 140/54mm HG in hypertensive participants. < 130/68mm HG in hypertensive participants with diabetes, heart failure or chronic Nguyen disease.;Long Term: Maintenance of blood pressure at goal levels.    Lipids  Yes    Intervention  Provide education and support for participant on nutrition & aerobic/resistive exercise along with prescribed medications to achieve LDL 70mg , HDL >40mg .    Expected Outcomes  Short Term: Participant states understanding of desired cholesterol values and is compliant with medications prescribed. Participant is following exercise prescription and nutrition guidelines.;Long Term: Cholesterol controlled with medications as prescribed, with individualized exercise RX and with personalized nutrition plan. Value goals: LDL < 70mg , HDL > 40 mg.       Core Components/Risk Factors/Patient Goals Review:  Goals and Risk Factor Review    Row Name 10/20/17 1023 11/15/17 1437 12/15/17 1537         Core Components/Risk Factors/Patient Goals Review   Personal Goals Review  Weight Management/Obesity;Hypertension;Lipids  Weight Management/Obesity;Hypertension;Lipids   Weight Management/Obesity;Hypertension;Lipids     Review  Patrick Nguyen's vital signs and weights have been stable at cardiac rehab. Patrick Nguyen is doing well with exercise.  Patrick Nguyen's vital signs and weights have been stable at cardiac rehab. Patrick Nguyen is doing well with exercise.  Patrick Nguyen's vital signs and weights have been stable at cardiac rehab. Patrick Nguyen is doing well with exercise.     Expected Outcomes  Patient will continue to partcipate in phase 2 cardiac rehab, follow a heart healthy diet and make lifestyle modifications.  Patient will continue to partcipate in phase 2 cardiac rehab, follow a heart healthy diet and make lifestyle modifications.  Patient will continue to partcipate in phase 2 cardiac rehab, follow a heart healthy diet and make lifestyle modifications.        Core Components/Risk Factors/Patient Goals at Discharge (Final Review):  Goals and Risk Factor Review - 12/15/17 1537      Core Components/Risk Factors/Patient Goals Review   Personal Goals Review  Weight Management/Obesity;Hypertension;Lipids    Review  Patrick Nguyen's vital signs and weights have been stable at cardiac rehab. Patrick Nguyen is doing well with exercise.    Expected Outcomes  Patient will continue to partcipate in phase 2 cardiac rehab, follow a heart healthy diet and make lifestyle modifications.       ITP Comments: ITP Comments    Row Name 09/27/17 1432 10/20/17 1009 11/15/17 1437 12/15/17 1534     ITP Comments  Dr. Fransico Him, Medical Director  30 Day ITP Review. Patient with good participation and attendance at  phase 2 cardiac rehab.  30 Day ITP Review. Patient with good participation and attendance at phase 2 cardiac rehab.  30 Day ITP Review. Patient with good participation and attendance at phase 2 cardiac rehab.       Comments: See ITP comments. Elisa's vital signs were stable yesterday as his medications were adjusted.Barnet Pall, RN,BSN 12/15/2017 3:48 PM

## 2017-12-16 ENCOUNTER — Encounter (HOSPITAL_COMMUNITY)
Admission: RE | Admit: 2017-12-16 | Discharge: 2017-12-16 | Disposition: A | Discharge status: Home / Self Care | Payer: Medicare Other | Source: Ambulatory Visit | Attending: Cardiology | Admitting: Cardiology

## 2017-12-16 ENCOUNTER — Encounter (HOSPITAL_COMMUNITY): Payer: Medicare Other

## 2017-12-16 DIAGNOSIS — Z951 Presence of aortocoronary bypass graft: Secondary | ICD-10-CM

## 2017-12-16 DIAGNOSIS — Z7901 Long term (current) use of anticoagulants: Secondary | ICD-10-CM | POA: Diagnosis not present

## 2017-12-16 DIAGNOSIS — M1990 Unspecified osteoarthritis, unspecified site: Secondary | ICD-10-CM | POA: Diagnosis not present

## 2017-12-16 DIAGNOSIS — I10 Essential (primary) hypertension: Secondary | ICD-10-CM | POA: Diagnosis not present

## 2017-12-16 DIAGNOSIS — Z79899 Other long term (current) drug therapy: Secondary | ICD-10-CM | POA: Diagnosis not present

## 2017-12-16 DIAGNOSIS — Z7982 Long term (current) use of aspirin: Secondary | ICD-10-CM | POA: Diagnosis not present

## 2017-12-19 ENCOUNTER — Encounter (HOSPITAL_COMMUNITY)
Admission: RE | Admit: 2017-12-19 | Discharge: 2017-12-19 | Disposition: A | Discharge status: Home / Self Care | Payer: Medicare Other | Source: Ambulatory Visit | Attending: Cardiology | Admitting: Cardiology

## 2017-12-19 ENCOUNTER — Encounter (HOSPITAL_COMMUNITY): Payer: Medicare Other

## 2017-12-19 DIAGNOSIS — Z951 Presence of aortocoronary bypass graft: Secondary | ICD-10-CM | POA: Diagnosis not present

## 2017-12-19 DIAGNOSIS — Z79899 Other long term (current) drug therapy: Secondary | ICD-10-CM | POA: Diagnosis not present

## 2017-12-19 DIAGNOSIS — Z7901 Long term (current) use of anticoagulants: Secondary | ICD-10-CM | POA: Diagnosis not present

## 2017-12-19 DIAGNOSIS — M1990 Unspecified osteoarthritis, unspecified site: Secondary | ICD-10-CM | POA: Diagnosis not present

## 2017-12-19 DIAGNOSIS — I10 Essential (primary) hypertension: Secondary | ICD-10-CM | POA: Diagnosis not present

## 2017-12-19 DIAGNOSIS — Z7982 Long term (current) use of aspirin: Secondary | ICD-10-CM | POA: Diagnosis not present

## 2017-12-21 ENCOUNTER — Encounter (HOSPITAL_COMMUNITY)
Admission: RE | Admit: 2017-12-21 | Discharge: 2017-12-21 | Disposition: A | Discharge status: Home / Self Care | Payer: Medicare Other | Source: Ambulatory Visit | Attending: Cardiology | Admitting: Cardiology

## 2017-12-21 ENCOUNTER — Encounter (HOSPITAL_COMMUNITY): Payer: Medicare Other

## 2017-12-21 DIAGNOSIS — Z79899 Other long term (current) drug therapy: Secondary | ICD-10-CM | POA: Diagnosis not present

## 2017-12-21 DIAGNOSIS — I10 Essential (primary) hypertension: Secondary | ICD-10-CM | POA: Diagnosis not present

## 2017-12-21 DIAGNOSIS — M1990 Unspecified osteoarthritis, unspecified site: Secondary | ICD-10-CM | POA: Diagnosis not present

## 2017-12-21 DIAGNOSIS — Z7982 Long term (current) use of aspirin: Secondary | ICD-10-CM | POA: Diagnosis not present

## 2017-12-21 DIAGNOSIS — Z951 Presence of aortocoronary bypass graft: Secondary | ICD-10-CM

## 2017-12-21 DIAGNOSIS — Z7901 Long term (current) use of anticoagulants: Secondary | ICD-10-CM | POA: Diagnosis not present

## 2017-12-23 ENCOUNTER — Encounter (HOSPITAL_COMMUNITY): Payer: Medicare Other

## 2017-12-23 ENCOUNTER — Encounter (HOSPITAL_COMMUNITY)
Admission: RE | Admit: 2017-12-23 | Discharge: 2017-12-23 | Disposition: A | Discharge status: Home / Self Care | Payer: Medicare Other | Source: Ambulatory Visit | Attending: Cardiology | Admitting: Cardiology

## 2017-12-23 DIAGNOSIS — Z951 Presence of aortocoronary bypass graft: Secondary | ICD-10-CM | POA: Diagnosis not present

## 2017-12-23 DIAGNOSIS — M1990 Unspecified osteoarthritis, unspecified site: Secondary | ICD-10-CM | POA: Diagnosis not present

## 2017-12-23 DIAGNOSIS — I10 Essential (primary) hypertension: Secondary | ICD-10-CM | POA: Diagnosis not present

## 2017-12-23 DIAGNOSIS — Z7982 Long term (current) use of aspirin: Secondary | ICD-10-CM | POA: Diagnosis not present

## 2017-12-23 DIAGNOSIS — Z7901 Long term (current) use of anticoagulants: Secondary | ICD-10-CM | POA: Diagnosis not present

## 2017-12-23 DIAGNOSIS — Z79899 Other long term (current) drug therapy: Secondary | ICD-10-CM | POA: Diagnosis not present

## 2017-12-26 ENCOUNTER — Encounter (HOSPITAL_COMMUNITY): Payer: Medicare Other

## 2017-12-26 ENCOUNTER — Encounter (HOSPITAL_COMMUNITY)
Admission: RE | Admit: 2017-12-26 | Discharge: 2017-12-26 | Disposition: A | Discharge status: Home / Self Care | Payer: Medicare Other | Source: Ambulatory Visit | Attending: Cardiology | Admitting: Cardiology

## 2017-12-26 DIAGNOSIS — I10 Essential (primary) hypertension: Secondary | ICD-10-CM | POA: Diagnosis not present

## 2017-12-26 DIAGNOSIS — Z951 Presence of aortocoronary bypass graft: Secondary | ICD-10-CM

## 2017-12-26 DIAGNOSIS — Z7982 Long term (current) use of aspirin: Secondary | ICD-10-CM | POA: Diagnosis not present

## 2017-12-26 DIAGNOSIS — Z79899 Other long term (current) drug therapy: Secondary | ICD-10-CM | POA: Diagnosis not present

## 2017-12-26 DIAGNOSIS — M1990 Unspecified osteoarthritis, unspecified site: Secondary | ICD-10-CM | POA: Diagnosis not present

## 2017-12-26 DIAGNOSIS — Z7901 Long term (current) use of anticoagulants: Secondary | ICD-10-CM | POA: Diagnosis not present

## 2017-12-26 NOTE — Progress Notes (Addendum)
Patrick Nguyen blood pressure was noted at 194/78 on the rower today during exercise at cardiac rehab. Patient asymptomatic. subsequent blood pressure, 162/84 then 124/60 for an exit blood pressure. Dr Inda Merlin office called and notified.Will fax exercise flow sheets to Dr. Inda Merlin office for review.Barnet Pall, RN,BSN 12/26/2017 5:02 PM

## 2017-12-28 ENCOUNTER — Encounter (HOSPITAL_COMMUNITY)
Admission: RE | Admit: 2017-12-28 | Discharge: 2017-12-28 | Disposition: A | Discharge status: Home / Self Care | Payer: Medicare Other | Source: Ambulatory Visit | Attending: Cardiology | Admitting: Cardiology

## 2017-12-28 ENCOUNTER — Encounter (HOSPITAL_COMMUNITY): Payer: Medicare Other

## 2017-12-28 DIAGNOSIS — M1990 Unspecified osteoarthritis, unspecified site: Secondary | ICD-10-CM | POA: Diagnosis not present

## 2017-12-28 DIAGNOSIS — Z79899 Other long term (current) drug therapy: Secondary | ICD-10-CM | POA: Diagnosis not present

## 2017-12-28 DIAGNOSIS — Z7982 Long term (current) use of aspirin: Secondary | ICD-10-CM | POA: Diagnosis not present

## 2017-12-28 DIAGNOSIS — I10 Essential (primary) hypertension: Secondary | ICD-10-CM | POA: Diagnosis not present

## 2017-12-28 DIAGNOSIS — Z7901 Long term (current) use of anticoagulants: Secondary | ICD-10-CM | POA: Diagnosis not present

## 2017-12-28 DIAGNOSIS — Z951 Presence of aortocoronary bypass graft: Secondary | ICD-10-CM | POA: Diagnosis not present

## 2017-12-29 DIAGNOSIS — Z23 Encounter for immunization: Secondary | ICD-10-CM | POA: Diagnosis not present

## 2017-12-30 ENCOUNTER — Encounter (HOSPITAL_COMMUNITY): Payer: Medicare Other

## 2017-12-30 ENCOUNTER — Encounter (HOSPITAL_COMMUNITY)
Admission: RE | Admit: 2017-12-30 | Discharge: 2017-12-30 | Disposition: A | Discharge status: Home / Self Care | Payer: Medicare Other | Source: Ambulatory Visit | Attending: Cardiology | Admitting: Cardiology

## 2017-12-30 VITALS — BP 102/72 | HR 53 | Ht 69.0 in | Wt 165.1 lb

## 2017-12-30 DIAGNOSIS — M1990 Unspecified osteoarthritis, unspecified site: Secondary | ICD-10-CM | POA: Diagnosis not present

## 2017-12-30 DIAGNOSIS — Z7901 Long term (current) use of anticoagulants: Secondary | ICD-10-CM | POA: Diagnosis not present

## 2017-12-30 DIAGNOSIS — Z79899 Other long term (current) drug therapy: Secondary | ICD-10-CM | POA: Diagnosis not present

## 2017-12-30 DIAGNOSIS — Z951 Presence of aortocoronary bypass graft: Secondary | ICD-10-CM

## 2017-12-30 DIAGNOSIS — Z7982 Long term (current) use of aspirin: Secondary | ICD-10-CM | POA: Diagnosis not present

## 2017-12-30 DIAGNOSIS — I10 Essential (primary) hypertension: Secondary | ICD-10-CM | POA: Diagnosis not present

## 2017-12-30 NOTE — Progress Notes (Signed)
Discharge Progress Report  Patient Details  Name: Patrick Nguyen MRN: [id number] Date of Birth: Dec 25, 1940 Referring Provider:     [facility] from 09/29/2017 in [place]  Referring Provider  Minus Breeding MD       Number of Visits: 36  Reason for Discharge:  Patient reached a stable level of exercise. Patient independent in their exercise. Patient has met program and personal goals.  Smoking History:  Social History   Tobacco Use  Smoking Status Former Smoker  . Last attempt to quit: 05/13/1986  . Years since quitting: 31.7  Smokeless Tobacco Never Used    Diagnosis:  S/P CABG (coronary artery bypass graft)  ADL [facility]SD:   Initial Exercise Prescription: Initial Exercise Prescription - 09/29/17 1100      Date of Initial Exercise RX and Referring Provider   Date  09/29/17    Referring Provider  Minus Breeding MD      Treadmill   MPH  3    Grade  0    Minutes  10    METs  3.3      Bike   Level  1    Minutes  10    METs  3.4      NuStep   Level  3    SPM  80    Minutes  10    METs  3      Prescription Details   Frequency (times per week)  3    Duration  Progress to 30 minutes of continuous aerobic without signs/symptoms of physical distress      Intensity   THRR 40-80% of Max Heartrate  58-116    Ratings of Perceived Exertion  11-13    Perceived Dyspnea  0-4      Progression   Progression  Continue to progress workloads to maintain intensity without signs/symptoms of physical distress.      Resistance Training   Training Prescription  Yes    Weight  3lbs    Reps  10-15       Discharge Exercise Prescription (Final Exercise Prescription Changes): Exercise Prescription Changes - 12/30/17 0945      Response to Exercise   Blood Pressure (Admit)  102/72    Blood Pressure (Exercise)  142/80   Recheck: 138/82   Blood Pressure (Exit)  112/62    Heart Rate (Admit)  53 bpm    Heart Rate  (Exercise)  111 bpm    Heart Rate (Exit)  59 bpm    Rating of Perceived Exertion (Exercise)  12    Perceived Dyspnea (Exercise)  0    Symptoms  None    Comments  --    Duration  Continue with 30 min of aerobic exercise without signs/symptoms of physical distress.    Intensity  THRR unchanged      Progression   Progression  Continue to progress workloads to maintain intensity without signs/symptoms of physical distress.    Average METs  4.9      Resistance Training   Training Prescription  Yes    Weight  6lbs    Reps  10-15    Time  10 Minutes      Interval Training   Interval Training  Yes    Equipment  Treadmill    Comments  Patient walks 3.3/4 for 7 minutes and 3.5/4 for 3 minutes.      Treadmill   MPH  3.3   3.5 mph for  3 minutes   Grade  4    Minutes  10    METs  5.35   5.61 METs for 3 minutes     NuStep   Level  7    SPM  95    Minutes  10    METs  3.7      Rower   Level  4    Watts  31    Minutes  10    METs  4.96      Home Exercise Plan   Plans to continue exercise at  Home (comment)    Frequency  Add 4 additional days to program exercise sessions.    Initial Home Exercises Provided  10/12/17       Functional Capacity: 6 Minute Walk    Row Name 09/29/17 1144 12/16/17 1022       6 Minute Walk   Phase  Initial  Discharge    Distance  1779 feet  1866 feet    Distance % Change  -  4.89 %    Walk Time  6 minutes  6 minutes    # of Rest Breaks  0  0    MPH  3.4  3.53    METS  3.4  4.04    RPE  11  11    VO2 Peak  11.96  14.14    Symptoms  No  No    Resting HR  60 bpm  59 bpm    Resting BP  118/60  120/60    Resting Oxygen Saturation   95 %  -    Exercise Oxygen Saturation  during 6 min walk  96 %  -    Max Ex. HR  83 bpm  100 bpm    Max Ex. BP  120/80  158/72    2 Minute Post BP  126/68  118/70       Psychological, QOL, Others - Outcomes: PHQ 2/9: Depression screen [facility] 2/9 12/30/2017 10/05/2017  Decreased Interest 0 0  Down, Depressed,  Hopeless 0 0  PHQ - 2 Score 0 0    Quality of Life: Quality of Life - 01/24/18 1538      Quality of Life   Select  Quality of Life      Quality of Life Scores   Health/Function Pre  28.53 %    Health/Function Post  28.37 %    Health/Function % Change  -0.56 %    Socioeconomic Pre  30 %    Socioeconomic Post  29.14 %    Socioeconomic % Change   -2.87 %    Psych/Spiritual Pre  28.93 %    Psych/Spiritual Post  30 %    Psych/Spiritual % Change  3.7 %    Family Pre  28.8 %    Family Post  28.5 %    Family % Change  -1.04 %    GLOBAL Pre  28.92 %    GLOBAL Post  28.88 %    GLOBAL % Change  -0.14 %       Personal Goals: Goals established at orientation with interventions provided to work toward goal. Personal Goals and Risk Factors at Admission - 09/29/17 1152      Core Components/Risk Factors/Patient Goals on Admission    Weight Management  Yes;Weight Maintenance    Intervention  Weight Management: Develop a combined nutrition and exercise program designed to reach desired caloric intake, while maintaining appropriate intake of nutrient and fiber, sodium  and fats, and appropriate energy expenditure required for the weight goal.;Weight Management: Provide education and appropriate resources to help participant work on and attain dietary goals.;Weight Management/Obesity: Establish reasonable short term and long term weight goals.    Admit Weight  169 lb 5 oz (76.8 kg)    Goal Weight: Short Term  169 lb (76.7 kg)    Goal Weight: Long Term  169 lb (76.7 kg)    Expected Outcomes  Short Term: Continue to assess and modify interventions until short term weight is achieved;Long Term: Adherence to nutrition and physical activity/exercise program aimed toward attainment of established weight goal;Weight Maintenance: Understanding of the daily nutrition guidelines, which includes 25-35% calories from fat, 7% or less cal from saturated fats, less than 297m cholesterol, less than 1.5gm of  sodium, & 5 or more servings of fruits and vegetables daily;Understanding recommendations for meals to include 15-35% energy as protein, 25-35% energy from fat, 35-60% energy from carbohydrates, less than 2038mof dietary cholesterol, 20-35 gm of total fiber daily;Understanding of distribution of calorie intake throughout the day with the consumption of 4-5 meals/snacks    Hypertension  Yes    Intervention  Provide education on lifestyle modifcations including regular physical activity/exercise, weight management, moderate sodium restriction and increased consumption of fresh fruit, vegetables, and low fat dairy, alcohol moderation, and smoking cessation.;Monitor prescription use compliance.    Expected Outcomes  Short Term: Continued assessment and intervention until BP is < 140/9071mG in hypertensive participants. < 130/20m37m in hypertensive participants with diabetes, heart failure or chronic kidney disease.;Long Term: Maintenance of blood pressure at goal levels.    Lipids  Yes    Intervention  Provide education and support for participant on nutrition & aerobic/resistive exercise along with prescribed medications to achieve LDL <70mg44mL >40mg.24mExpected Outcomes  Short Term: Participant states understanding of desired cholesterol values and is compliant with medications prescribed. Participant is following exercise prescription and nutrition guidelines.;Long Term: Cholesterol controlled with medications as prescribed, with individualized exercise RX and with personalized nutrition plan. Value goals: LDL < 70mg, 46m> 40 mg.        Personal Goals Discharge: Goals and Risk Factor Review    Row Name 10/20/17 1023 11/15/17 1437 12/15/17 1537 12/30/17 0945       Core Components/Risk Factors/Patient Goals Review   Personal Goals Review  Weight Management/Obesity;Hypertension;Lipids  Weight Management/Obesity;Hypertension;Lipids  Weight Management/Obesity;Hypertension;Lipids  Weight  Management/Obesity;Hypertension;Lipids    Review  Laureano's vital signs and weights have been stable at cardiac rehab. Rivadeneira Jaquayng well with exercise.  Danish's vital signs and weights have been stable at cardiac rehab. Leather Urtonng well with exercise.  Mcchristian's vital signs and weights have been stable at cardiac rehab. Mayeux Castillang well with exercise.  -    Expected Outcomes  Patient will continue to partcipate in phase 2 cardiac rehab, follow a heart healthy diet and make lifestyle modifications.  Patient will continue to partcipate in phase 2 cardiac rehab, follow a heart healthy diet and make lifestyle modifications.  Patient will continue to partcipate in phase 2 cardiac rehab, follow a heart healthy diet and make lifestyle modifications.  Patient will continue to exercise on his treadmill walk at home and will use the total gym.follow a heart healthy diet and make lifestyle modifications.       Exercise Goals and Review: Exercise Goals    Row Name 09/29/17 [id number]  Exercise Goals   Increase Physical Activity  Yes       Intervention  Provide advice, education, support and counseling about physical activity/exercise needs.;Develop an individualized exercise prescription for aerobic and resistive training based on initial evaluation findings, risk stratification, comorbidities and participant's personal goals.       Expected Outcomes  Short Term: Attend rehab on a regular basis to increase amount of physical activity.;Long Term: Exercising regularly at least 3-5 days a week.;Long Term: Add in home exercise to make exercise part of routine and to increase amount of physical activity.       Increase Strength and Stamina  Yes       Intervention  Provide advice, education, support and counseling about physical activity/exercise needs.;Develop an individualized exercise prescription for aerobic and resistive training based on initial evaluation findings, risk stratification,  comorbidities and participant's personal goals.       Expected Outcomes  Short Term: Increase workloads from initial exercise prescription for resistance, speed, and METs.;Short Term: Perform resistance training exercises routinely during rehab and add in resistance training at home;Long Term: Improve cardiorespiratory fitness, muscular endurance and strength as measured by increased METs and functional capacity (6MWT)       Able to understand and use rate of perceived exertion (RPE) scale  Yes       Intervention  Provide education and explanation on how to use RPE scale       Expected Outcomes  Short Term: Able to use RPE daily in rehab to express subjective intensity level;Long Term:  Able to use RPE to guide intensity level when exercising independently       Knowledge and understanding of Target Heart Rate Range (THRR)  Yes       Intervention  Provide education and explanation of THRR including how the numbers were predicted and where they are located for reference       Expected Outcomes  Short Term: Able to state/look up THRR;Long Term: Able to use THRR to govern intensity when exercising independently;Short Term: Able to use daily as guideline for intensity in rehab       Able to check pulse independently  Yes       Intervention  Provide education and demonstration on how to check pulse in carotid and radial arteries.;Review the importance of being able to check your own pulse for safety during independent exercise       Expected Outcomes  Short Term: Able to explain why pulse checking is important during independent exercise;Long Term: Able to check pulse independently and accurately       Understanding of Exercise Prescription  Yes       Intervention  Provide education, explanation, and written materials on patient's individual exercise prescription       Expected Outcomes  Short Term: Able to explain program exercise prescription;Long Term: Able to explain home exercise prescription to  exercise independently          Nutrition & Weight - Outcomes: Pre Biometrics - 09/29/17 1152      Pre Biometrics   Height  5' 9"  (1.753 m)    Weight  169 lb 5 oz (76.8 kg)    Waist Circumference  39.25 inches    Hip Circumference  38.25 inches    Waist to Hip Ratio  1.03 %    BMI (Calculated)  24.99    Triceps Skinfold  23 mm    % Body Fat  28.2 %    Grip Strength  41  kg    Flexibility  0 in    Single Leg Stand  12.84 seconds      Post Biometrics - 12/30/17 0945       Post  Biometrics   Height  5' 9"  (1.753 m)    Weight  165 lb 2 oz (74.9 kg)    Waist Circumference  37.75 inches    Hip Circumference  37.5 inches    Waist to Hip Ratio  1.01 %    BMI (Calculated)  24.37    Triceps Skinfold  17 mm    % Body Fat  26 %    Grip Strength  41 kg    Flexibility  0 in    Single Leg Stand  30 seconds       Nutrition: Nutrition Therapy & Goals - 10/17/17 1034      Nutrition Therapy   Diet  heart healthy      Personal Nutrition Goals   Nutrition Goal  pt to identify and limit sources of saturated fat, trans fat, and sodium in diet      Intervention Plan   Intervention  Prescribe, educate and counsel regarding individualized specific dietary modifications aiming towards targeted core components such as weight, hypertension, lipid management, diabetes, heart failure and other comorbidities.    Expected Outcomes  Short Term Goal: Understand basic principles of dietary content, such as calories, fat, sodium, cholesterol and nutrients.;Long Term Goal: Adherence to prescribed nutrition plan.       Nutrition Discharge: Nutrition Assessments - 01/26/18 0951      MEDFICTS Scores   Pre Score  12    Post Score  --   pt did not return      Education Questionnaire Score: Knowledge Questionnaire Score - 01/24/18 1541      Knowledge Questionnaire Score   Pre Score  19/24    Post Score  21/24       Goals reviewed with patient; copy given to patient.Budney graduated from  cardiac rehab program today with completion of 36 exercise sessions in Phase II. Pt maintained good attendance and progressed nicely during his participation in rehab as evidenced by increased MET level.   Medication list reconciled. Repeat  PHQ score-0  .  Pt has made significant lifestyle changes and should be commended for his success. Pt feels he has achieved his goals during cardiac rehab.   Pt plans to continue exercise on his treadmill at home. A total gym and .Walking three times a week. We are proud of Cahall's progress as he increased his distance on his post exercise walk test and lost 4 pounds during his participation in the program..Birdell Frasier Lyndon, RN,BSN 01/26/2018 10:46 AM

## 2018-01-02 ENCOUNTER — Encounter (HOSPITAL_COMMUNITY): Payer: Medicare Other

## 2018-01-04 ENCOUNTER — Encounter (HOSPITAL_COMMUNITY): Payer: Medicare Other

## 2018-01-06 ENCOUNTER — Encounter (HOSPITAL_COMMUNITY): Payer: Medicare Other

## 2018-01-24 ENCOUNTER — Other Ambulatory Visit: Payer: Self-pay | Admitting: Cardiothoracic Surgery

## 2018-01-24 NOTE — Addendum Note (Signed)
Encounter addended by: Sol Passer on: 01/24/2018 3:47 PM  Actions taken: Flowsheet accepted, Vitals modified, Flowsheet data copied forward, Visit Navigator Flowsheet section accepted

## 2018-01-26 NOTE — Addendum Note (Signed)
Encounter addended by: Ivonne Andrew, RD on: 01/26/2018 9:58 AM  Actions taken: Flowsheet data copied forward, Visit Navigator Flowsheet section accepted

## 2018-02-19 ENCOUNTER — Other Ambulatory Visit: Payer: Self-pay | Admitting: Cardiology

## 2018-03-17 DIAGNOSIS — H2513 Age-related nuclear cataract, bilateral: Secondary | ICD-10-CM | POA: Diagnosis not present

## 2018-03-26 NOTE — Progress Notes (Signed)
Cardiology Office Note    Date:  03/27/2018   ID:  Patrick Nguyen, DOB 03-Aug-1940, MRN [id number]  PCP:  [name], MD  Cardiologist:   No primary care provider on file. Referring:  [name], MD   No chief complaint on file.     History of Present Illness: Patrick Nguyen is a 77 y.o. male who was referred by [name], MD for evaluation of CAD.  He had moved here from [place].   He had shortness of breath in 2016.  Stress echocardiogram in 2016 demonstrated a well-preserved ejection fraction.  There were no wall motion abnormalities.   There was another stress echo in December 2017 that  also demonstrated no wall motion abnormalities.  This year when he was again seen by cardiology he had some palpitations and vague chest discomfort.  A coronary calcium score was 400 with 3 vessel distribution.  I was able to speak with his cardiologist and get the results of the stress test that they did prior to him moving from [place].   He was found to have a fixed defect with peri-infarct ischemia.  There was ST depression post exercise.  His ejection fraction was 61%.  He did wear a Holter and was found to have some nonsustained cardia atrial tachycardia, some PVCs and one 3 beat run of nonsustained V. tach.  After he moved here we sent him for a cardiac cath and he was found to have 3 vessel disease.  He is now status post CABG and returns for follow up.    Since I last saw him he has done well. The patient denies any new symptoms such as chest discomfort, neck or arm discomfort. There has been no new shortness of breath, PND or orthopnea. There have been no reported palpitations, presyncope or syncope.   He is walking routinely.  He does not have any of the fatigue or shortness of breath he had previously.   Past Medical History:  Diagnosis Date  . Anginal pain ([place]) 05/13/2017  . Arthritis   . CAD (coronary artery disease) 05/13/2017   s/p CABG w/ / LIMA-LAD, SVG-OM, SVG-PDA  .  Dyspnea    w/ exertion   . Elevated coronary artery calcium score   . GERD (gastroesophageal reflux disease)    hx  . Hepatitis    in   high  school    . Hyperlipidemia   . Hypertension   . Myocardial infarction ([place])   . Rosacea      Current Outpatient Medications  Medication Sig Dispense Refill  . aspirin EC 81 MG tablet Take 81 mg by mouth daily.    Marland Kitchen lisinopril (PRINIVIL,ZESTRIL) 2.5 MG tablet Take 2.5 mg by mouth daily.    . metoprolol tartrate (LOPRESSOR) 25 MG tablet Take 12.5 mg by mouth 2 (two) times daily.    . rosuvastatin (CRESTOR) 20 MG tablet Take 1 tablet (20 mg total) by mouth every evening. 30 tablet 2  . nitroGLYCERIN (NITROSTAT) 0.4 MG SL tablet Place 1 tablet (0.4 mg total) under the tongue every 5 (five) minutes as needed for chest pain. 25 tablet 3   No current facility-administered medications for this visit.     Allergies:   Atorvastatin   ROS:  Please see the history of present illness.   Otherwise, review of systems are positive for none.   All other systems are reviewed and negative.    PHYSICAL EXAM: VS:  BP (!) 144/62   Pulse Marland Kitchen)  57   Ht 6' (1.829 m)   Wt 163 lb 9.6 oz (74.2 kg)   BMI 22.19 kg/m  , BMI Body mass index is 22.19 kg/m.  GENERAL:  Well appearing NECK:  No jugular venous distention, waveform within normal limits, carotid upstroke brisk and symmetric, no bruits, no thyromegaly LUNGS:  Clear to auscultation bilaterally CHEST:  Well healed sternotomy scar. HEART:  PMI not displaced or sustained,S1 and S2 within normal limits, no S3, no S4, no clicks, no rubs, no  murmurs ABD:  Flat, positive bowel sounds normal in frequency in pitch, no bruits, no rebound, no guarding, no midline pulsatile mass, no hepatomegaly, no splenomegaly EXT:  2 plus pulses throughout, no edema, no cyanosis no clubbing  EKG:  EKG is ordered today. Sinus rhythm, rate 57, axis within normal limits, intervals within normal limits, no acute ST-T wave  changes.   Recent Labs: 05/16/2017: TSH 2.260 06/12/2017: Hemoglobin 10.1; Magnesium 2.0; Platelets 221 06/13/2017: BUN 14; Creatinine, Ser 0.92; Potassium 3.4; Sodium 139 09/26/2017: ALT 25    Lipid Panel    Component Value Date/Time   CHOL 134 09/26/2017 0838   TRIG 73 09/26/2017 0838   HDL 70 09/26/2017 0838   CHOLHDL 1.9 09/26/2017 0838   LDLCALC 49 09/26/2017 0838      Wt Readings from Last 3 Encounters:  03/27/18 163 lb 9.6 oz (74.2 kg)  12/30/17 165 lb 2 oz (74.9 kg)  10/17/17 167 lb 1.7 oz (75.8 kg)      Other studies Reviewed: Additional studies/ records that were reviewed today include: Labs Review of the above records demonstrates:  See elsewhere   ASSESSMENT AND PLAN:  CAD/CABG:   The patient has no new sypmtoms.  No further cardiovascular testing is indicated.  We will continue with aggressive risk reduction and meds as listed.  DYSLIPIDEMIA:    LDL is 49 and HDL 70.  He will continue on the meds as listed.  HTN:  The blood pressure is at target.  No change in therapy.   ATRIAL FIB:  This was post CABG. he has had no symptomatic recurrence of this.  No change in therapy.   Current medicines are reviewed at length with the patient today.  The patient does not have concerns regarding medicines.  The following changes have been made:  None   Labs/ tests ordered today include:   None  No orders of the defined types were placed in this encounter.    Disposition:   FU with me in 12 months.     Signed, Minus Breeding, MD  03/27/2018 11:45 AM    [place]

## 2018-03-27 ENCOUNTER — Encounter: Payer: Self-pay | Admitting: Cardiology

## 2018-03-27 ENCOUNTER — Ambulatory Visit (INDEPENDENT_AMBULATORY_CARE_PROVIDER_SITE_OTHER): Payer: Medicare Other | Admitting: Cardiology

## 2018-03-27 VITALS — BP 144/62 | HR 57 | Ht 72.0 in | Wt 163.6 lb

## 2018-03-27 DIAGNOSIS — E785 Hyperlipidemia, unspecified: Secondary | ICD-10-CM | POA: Diagnosis not present

## 2018-03-27 DIAGNOSIS — I2510 Atherosclerotic heart disease of native coronary artery without angina pectoris: Secondary | ICD-10-CM

## 2018-03-27 DIAGNOSIS — I10 Essential (primary) hypertension: Secondary | ICD-10-CM

## 2018-03-27 MED ORDER — NITROGLYCERIN 0.4 MG SL SUBL: 0.4000 mg | SUBLINGUAL_TABLET | SUBLINGUAL | 3 refills | Status: DC | PRN

## 2018-03-27 NOTE — Patient Instructions (Signed)
Medication Instructions:  Continue current medications  If you need a refill on your cardiac medications before your next appointment, please call your pharmacy.  Labwork: None Ordered   If you have labs (blood work) drawn today and your tests are completely normal, you will receive your results only by [employer] (if you have MyChart) -OR- A paper copy in the mail.  If you have any lab test that is abnormal or we need to change your treatment, we will call you to review these results.  Testing/Procedures: None Ordered  Follow-Up: You will need a follow up appointment in 12 months.  Please call our office 2 months in advance to schedule this appointment.  You may see Dr Percival Spanish or one of the following Advanced Practice Providers on your designated Care Team:   Rosaria Ferries, PA-C . Jory Sims, DNP, ANP    At [facility], you and your health needs are our priority.  As part of our continuing mission to provide you with exceptional heart care, we have created designated Provider Care Teams.  These Care Teams include your primary Cardiologist (physician) and Advanced Practice Providers (APPs -  Physician Assistants and Nurse Practitioners) who all work together to provide you with the care you need, when you need it.

## 2018-03-27 NOTE — Addendum Note (Signed)
Addended by: Zebedee Iba on: 03/27/2018 11:53 AM   Modules accepted: Orders

## 2018-03-30 DIAGNOSIS — Z79899 Other long term (current) drug therapy: Secondary | ICD-10-CM | POA: Diagnosis not present

## 2018-03-30 DIAGNOSIS — Z1389 Encounter for screening for other disorder: Secondary | ICD-10-CM | POA: Diagnosis not present

## 2018-03-30 DIAGNOSIS — I25810 Atherosclerosis of coronary artery bypass graft(s) without angina pectoris: Secondary | ICD-10-CM | POA: Diagnosis not present

## 2018-03-30 DIAGNOSIS — Z23 Encounter for immunization: Secondary | ICD-10-CM | POA: Diagnosis not present

## 2018-03-30 DIAGNOSIS — E785 Hyperlipidemia, unspecified: Secondary | ICD-10-CM | POA: Diagnosis not present

## 2018-03-30 DIAGNOSIS — M7630 Iliotibial band syndrome, unspecified leg: Secondary | ICD-10-CM | POA: Diagnosis not present

## 2018-03-30 DIAGNOSIS — Z0000 Encounter for general adult medical examination without abnormal findings: Secondary | ICD-10-CM | POA: Diagnosis not present

## 2018-04-13 DIAGNOSIS — H25811 Combined forms of age-related cataract, right eye: Secondary | ICD-10-CM | POA: Diagnosis not present

## 2018-04-13 DIAGNOSIS — H2511 Age-related nuclear cataract, right eye: Secondary | ICD-10-CM | POA: Diagnosis not present

## 2018-04-27 DIAGNOSIS — H25812 Combined forms of age-related cataract, left eye: Secondary | ICD-10-CM | POA: Diagnosis not present

## 2018-04-27 DIAGNOSIS — H2512 Age-related nuclear cataract, left eye: Secondary | ICD-10-CM | POA: Diagnosis not present

## 2018-05-18 DIAGNOSIS — J209 Acute bronchitis, unspecified: Secondary | ICD-10-CM | POA: Diagnosis not present

## 2018-06-08 DIAGNOSIS — R0609 Other forms of dyspnea: Secondary | ICD-10-CM | POA: Diagnosis not present

## 2018-06-08 DIAGNOSIS — I25810 Atherosclerosis of coronary artery bypass graft(s) without angina pectoris: Secondary | ICD-10-CM | POA: Diagnosis not present

## 2018-06-16 DIAGNOSIS — H02054 Trichiasis without entropian left upper eyelid: Secondary | ICD-10-CM | POA: Diagnosis not present

## 2018-06-20 NOTE — Progress Notes (Signed)
Cardiology Office Note   Date:  06/20/2018   ID:  Patrick Nguyen, DOB [birth year], MRN [id number]  PCP:  [name], MD  Cardiologist:  Hochrein  No chief complaint on file.    History of Present Illness: Patrick Nguyen is a 78 y.o. male who presents for ongoing assessment and management of CAD with coronary CT score of 400 with 3 vessel distribution. Most recent stress test showed a fixed defect with peri-infarct ischemia. He wore a Holter monitor which revealed non-sustained tachycardia and atrial tachycardia with some PVC's and one 3 beat run of NSVT.   He had follow up cardiac cath which revealed 3 vessel disease, to and was recommended for CABG. This was competed by Dr. Nils Pyle on 06/07/2017, with LIMA to LAD,SVG to OM, SVG to PDA.   He was last seen by Dr. Percival Spanish on 03/27/2018 and was doing well and stable from CV standpoint. There was no changes in his therapy at that time.   Past Medical History:  Diagnosis Date  . Anginal pain ([place]) 05/13/2017  . Arthritis   . CAD (coronary artery disease) 05/13/2017   s/p CABG w/ / LIMA-LAD, SVG-OM, SVG-PDA  . Dyspnea    w/ exertion   . Elevated coronary artery calcium score   . GERD (gastroesophageal reflux disease)    hx  . Hepatitis    in   high  school    . Hyperlipidemia   . Hypertension   . Myocardial infarction ([place])   . Rosacea     Past Surgical History:  Procedure Laterality Date  . CARDIAC CATHETERIZATION    . CORONARY ARTERY BYPASS GRAFT N/A 06/07/2017   Procedure: CORONARY ARTERY BYPASS GRAFTING (CABG) x three, using left internal mammary artery and right leg greater saphenous vein harvested endoscopically;  Surgeon: Ivin Poot, MD;  Location: [facility];  Service: Open Heart Surgery;  Laterality: N/A;  . INTRAVASCULAR PRESSURE WIRE/FFR STUDY N/A 05/19/2017   Procedure: INTRAVASCULAR PRESSURE WIRE/FFR STUDY;  Surgeon: [name], MD;  Location: [facility];  Service: Cardiovascular;  Laterality: N/A;  .  LEFT HEART CATH AND CORONARY ANGIOGRAPHY N/A 05/19/2017   Procedure: LEFT HEART CATH AND CORONARY ANGIOGRAPHY;  Surgeon: [name], MD;  Location: [facility];  Service: Cardiovascular;  Laterality: N/A;  . ROTATOR CUFF REPAIR Left   . TEE WITHOUT CARDIOVERSION N/A 06/07/2017   Procedure: TRANSESOPHAGEAL ECHOCARDIOGRAM (TEE);  Surgeon: Prescott Gum, Collier Salina, MD;  Location: [facility];  Service: Open Heart Surgery;  Laterality: N/A;     Current Outpatient Medications  Medication Sig Dispense Refill  . aspirin EC 81 MG tablet Take 81 mg by mouth daily.    Marland Kitchen lisinopril (PRINIVIL,ZESTRIL) 2.5 MG tablet Take 2.5 mg by mouth daily.    . metoprolol tartrate (LOPRESSOR) 25 MG tablet Take 12.5 mg by mouth 2 (two) times daily.    . nitroGLYCERIN (NITROSTAT) 0.4 MG SL tablet Place 1 tablet (0.4 mg total) under the tongue every 5 (five) minutes as needed for chest pain. 25 tablet 3  . rosuvastatin (CRESTOR) 20 MG tablet Take 1 tablet (20 mg total) by mouth every evening. 30 tablet 2   No current facility-administered medications for this visit.     Allergies:   Atorvastatin    Social History:  The patient  reports that he quit smoking about 32 years ago. He has never used smokeless tobacco. He reports current alcohol use of about 3.0 standard drinks of alcohol per week. He  reports that he does not use drugs.   Family History:  The patient's family history includes AAA (abdominal aortic aneurysm) in his father; Cancer in his father; Heart disease (age of onset: 83) in his mother; Hypertension in his mother.    ROS: All other systems are reviewed and negative. Unless otherwise mentioned in H&P    PHYSICAL EXAM: VS:  There were no vitals taken for this visit. , BMI There is no height or weight on file to calculate BMI. GEN: Well nourished, well developed, in no acute distress HEENT: normal Neck: no JVD, carotid bruits, or masses Cardiac: RRR; no murmurs, rubs, or gallops,no edema  Respiratory:   Clear to auscultation bilaterally, normal work of breathing GI: soft, nontender, nondistended, + BS MS: no deformity or atrophy Skin: warm and dry, no rash Neuro:  Strength and sensation are intact Psych: euthymic mood, full affect   EKG:  Sinus bradycardia rate of 56 bpm.  Recent Labs: 09/26/2017: ALT 25    Lipid Panel    Component Value Date/Time   CHOL 134 09/26/2017 0838   TRIG 73 09/26/2017 0838   HDL 70 09/26/2017 0838   CHOLHDL 1.9 09/26/2017 0838   LDLCALC 49 09/26/2017 0838      Wt Readings from Last 3 Encounters:  03/27/18 163 lb 9.6 oz (74.2 kg)  12/30/17 165 lb 2 oz (74.9 kg)  10/17/17 167 lb 1.7 oz (75.8 kg)      Other studies Reviewed: Cardiac Cath 05/19/2017   Severe three-vessel coronary disease with chronic total occlusion of the mid RCA which fills from left to right by collaterals via septal perforators.    The large second obtuse marginal branch contains 85% stenosis.  The small first obtuse marginal contains ostial 75% narrowing,   The ostial LAD contains 60-70% stenosis (with threshold significant FFR of 0.78-0.80), and the large first diagonal contains 60% mid vessel stenosis.  Distal LAD contains diffuse 70% narrowing.  Left main is normal.  Basal inferior hypokinesis with preserved LVEF of 65%.  Normal filling pressures with LVEDP 13 mmHg.  RECOMMENDATIONS:   Significant three-vessel coronary disease and includes chronic total occlusion of large RCA.  The ostial LAD has threshold but significant FFR.  Recommend multivessel coronary bypass grafting.  Will refer to TCTS.  Secondary risk factor modification, based upon guideline recommendations: LDL less than 70, tight blood pressure control 130/85 mmHg or less, screening glycemic control and A1c less than 7, ATC. ASSESSMENT AND PLAN:  1. CAD: No cardiac complaints at this time. No chest pain or DOE.  I will change his lisinopril to olmesartan 5 mg in case this is an ACE related tickle cough.  If this is not improved with medication change, he may restart lisinopril on follow up.   2. Allergies:  He appears to be having seasonal allergy symptoms as well. He is advised to take OTC non-drowsy allergy medication such as Claritin or Zyrtec for symptomatic relief.   3. GERD: I will add a H2 blocker to assist with these symptoms and help with throat irritation. If no better, will need to follow up with ENT as he had seen in the past.   4. Hypercholesterolemia: Continue statin therapy.   Current medicines are reviewed at length with the patient today.    Labs/ tests ordered today include: None  Phill Myron. West Pugh, ANP, AACC   06/20/2018 4:22 PM    [address] Office [phone] Fax [phone]

## 2018-06-22 ENCOUNTER — Encounter: Payer: Self-pay | Admitting: Adult Health

## 2018-06-22 ENCOUNTER — Other Ambulatory Visit: Payer: Self-pay

## 2018-06-22 ENCOUNTER — Ambulatory Visit (INDEPENDENT_AMBULATORY_CARE_PROVIDER_SITE_OTHER): Payer: Medicare Other | Admitting: Adult Health

## 2018-06-22 VITALS — BP 126/70 | HR 56 | Ht 70.0 in | Wt 160.8 lb

## 2018-06-22 DIAGNOSIS — T464X5A Adverse effect of angiotensin-converting-enzyme inhibitors, initial encounter: Secondary | ICD-10-CM | POA: Diagnosis not present

## 2018-06-22 DIAGNOSIS — I10 Essential (primary) hypertension: Secondary | ICD-10-CM | POA: Diagnosis not present

## 2018-06-22 DIAGNOSIS — R05 Cough: Secondary | ICD-10-CM | POA: Diagnosis not present

## 2018-06-22 DIAGNOSIS — I2510 Atherosclerotic heart disease of native coronary artery without angina pectoris: Secondary | ICD-10-CM

## 2018-06-22 MED ORDER — FAMOTIDINE 40 MG PO TABS: 40.0000 mg | ORAL_TABLET | Freq: Every day | ORAL | Status: DC

## 2018-06-22 MED ORDER — CETIRIZINE HCL 10 MG PO TABS: 10.0000 mg | ORAL_TABLET | Freq: Every day | ORAL | Status: DC

## 2018-06-22 MED ORDER — OLMESARTAN MEDOXOMIL 5 MG PO TABS: 2.5000 mg | ORAL_TABLET | Freq: Every day | ORAL | 6 refills | Status: DC

## 2018-06-22 NOTE — Progress Notes (Addendum)
Cardiology Office Note   Date:  06/22/2018   ID:  Patrick Nguyen, DOB 1941/02/23, MRN [id number]  PCP:  [name], MD  Cardiologist:  Hochrein  No chief complaint on file.    History of Present Illness: Patrick Nguyen is a 78 y.o. male who presents for ongoing assessment and management of CAD with coronary CT score of 400 with 3 vessel distribution. Most recent stress test showed a fixed defect with peri-infarct ischemia. He wore a Holter monitor which revealed non-sustained tachycardia and atrial tachycardia with some PVC's and one 3 beat run of NSVT.   He had follow up cardiac cath which revealed 3 vessel disease, to and was recommended for CABG. This was competed by Dr. Nils Pyle on 06/07/2017, with LIMA to LAD,SVG to OM, SVG to PDA.   He was last seen by Dr. Percival Spanish on 03/27/2018 and was doing well and stable from CV standpoint. There was no changes in his therapy at that time  He reports that he has had a tickle cough with congestion in the back of his throat with hoarseness and post nasal drip.  He has been sneezing. 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 He states his symptoms are interfering with this.   Past Medical History:  Diagnosis Date  . Anginal pain ([place]) 05/13/2017  . Arthritis   . CAD (coronary artery disease) 05/13/2017   s/p CABG w/ / LIMA-LAD, SVG-OM, SVG-PDA  . Dyspnea    w/ exertion   . Elevated coronary artery calcium score   . GERD (gastroesophageal reflux disease)    hx  . Hepatitis    in   high  school    . Hyperlipidemia   . Hypertension   . Myocardial infarction ([place])   . Rosacea     Past Surgical History:  Procedure Laterality Date  . CARDIAC CATHETERIZATION    . CORONARY ARTERY BYPASS GRAFT N/A 06/07/2017   Procedure: CORONARY ARTERY BYPASS GRAFTING (CABG) x three, using left internal mammary artery and right leg greater saphenous vein harvested endoscopically;  Surgeon: Ivin Poot, MD;  Location: [facility];  Service:  Open Heart Surgery;  Laterality: N/A;  . INTRAVASCULAR PRESSURE WIRE/FFR STUDY N/A 05/19/2017   Procedure: INTRAVASCULAR PRESSURE WIRE/FFR STUDY;  Surgeon: [name], MD;  Location: [facility];  Service: Cardiovascular;  Laterality: N/A;  . LEFT HEART CATH AND CORONARY ANGIOGRAPHY N/A 05/19/2017   Procedure: LEFT HEART CATH AND CORONARY ANGIOGRAPHY;  Surgeon: [name], MD;  Location: [facility];  Service: Cardiovascular;  Laterality: N/A;  . ROTATOR CUFF REPAIR Left   . TEE WITHOUT CARDIOVERSION N/A 06/07/2017   Procedure: TRANSESOPHAGEAL ECHOCARDIOGRAM (TEE);  Surgeon: Prescott Gum, Collier Salina, MD;  Location: [facility];  Service: Open Heart Surgery;  Laterality: N/A;     Current Outpatient Medications  Medication Sig Dispense Refill  . aspirin EC 81 MG tablet Take 81 mg by mouth daily.    . metoprolol tartrate (LOPRESSOR) 25 MG tablet Take 12.5 mg by mouth 2 (two) times daily.    . nitroGLYCERIN (NITROSTAT) 0.4 MG SL tablet Place 1 tablet (0.4 mg total) under the tongue every 5 (five) minutes as needed for chest pain. 25 tablet 3  . rosuvastatin (CRESTOR) 20 MG tablet Take 1 tablet (20 mg total) by mouth every evening. 30 tablet 2   No current facility-administered medications for this visit.     Allergies:   Atorvastatin    Social History:  The patient  reports that he quit smoking about 32 years ago. He has never used smokeless tobacco. He reports current alcohol use of about 3.0 standard drinks of alcohol per week. He reports that he does not use drugs.   Family History:  The patient's family history includes AAA (abdominal aortic aneurysm) in his father; Cancer in his father; Heart disease (age of onset: 51) in his mother; Hypertension in his mother.    ROS: All other systems are reviewed and negative. Unless otherwise mentioned in H&P    PHYSICAL EXAM: VS:  BP 126/70   Pulse (!) 56   Ht 5\' 10"  (1.778 m)   Wt 160 lb 12.8 oz (72.9 kg)   BMI 23.07 kg/m  , BMI Body  mass index is 23.07 kg/m. GEN: Well nourished, well developed, in no acute distress HEENT: normal, hoarseness and clearing his throat a lot. He has occasional coughing  Neck: no JVD, carotid bruits, or masses Cardiac:RRR; no murmurs, rubs, or gallops,no edema  Respiratory:  Clear to auscultation bilaterally, normal work of breathing GI: soft, nontender, nondistended, + BS MS: no deformity or atrophy Skin: warm and dry, no rash Neuro:  Strength and sensation are intact Psych: euthymic mood, full affect   EKG:  Sinus bradycardia rate of  56 bpm   Recent Labs: 09/26/2017: ALT 25    Lipid Panel    Component Value Date/Time   CHOL 134 09/26/2017 0838   TRIG 73 09/26/2017 0838   HDL 70 09/26/2017 0838   CHOLHDL 1.9 09/26/2017 0838   LDLCALC 49 09/26/2017 0838      Wt Readings from Last 3 Encounters:  06/22/18 160 lb 12.8 oz (72.9 kg)  03/27/18 163 lb 9.6 oz (74.2 kg)  12/30/17 165 lb 2 oz (74.9 kg)      Other studies Reviewed: Cardiac Cath 05/19/2017   Severe three-vessel coronary disease with chronic total occlusion of the mid RCA which fills from left to right by collaterals via septal perforators.    The large second obtuse marginal branch contains 85% stenosis.  The small first obtuse marginal contains ostial 75% narrowing,   The ostial LAD contains 60-70% stenosis (with threshold significant FFR of 0.78-0.80), and the large first diagonal contains 60% mid vessel stenosis.  Distal LAD contains diffuse 70% narrowing.  Left main is normal.  Basal inferior hypokinesis with preserved LVEF of 65%.  Normal filling pressures with LVEDP 13 mmHg.  RECOMMENDATIONS:   Significant three-vessel coronary disease and includes chronic total occlusion of large RCA.  The ostial LAD has threshold but significant FFR.  Recommend multivessel coronary bypass grafting.  Will refer to TCTS.  Secondary risk factor modification, based upon guideline recommendations: LDL less than 70,  tight blood pressure control 130/85 mmHg or less, screening glycemic control and A1c less than 7, ATC. ASSESSMENT AND PLAN:  1.CAD: No cardiac complaints of increase DOE or chest pain. Due to tickle cough, which may not be related to ACE, I will change him to olmesartan 5 mg daily and stop lisinopril.   2. Post nasal drip: Suggest OTC Zyrtec daily as this may be related to allergies.   3. GERD: Will try OTC H2 blocker for assistance with this , which may help with throat irritation as well. He is reluctant to do this but is willing to try. .    Current medicines are reviewed at length with the patient today.    Labs/ tests ordered today include: None  Phill Myron. West Pugh, ANP,  AACC   06/22/2018 8:46 AM    [address] Office [phone] Fax [phone]

## 2018-06-22 NOTE — Patient Instructions (Addendum)
Medication Instructions:  STOP LISINOPRIL START OLMESARTAN 2.5MG  (1/2 TAB) DAILY START PEPCID THIS IS OVER-THE-COUNTER-THIS IS FOR HOARSENESS START ZYRTEC THIS IS OVER-THE-COUNTER If you need a refill on your cardiac medications before your next appointment, please call your pharmacy.   Follow-Up: . You will need a follow up appointment in 3 months.  Please call our office 2 months in advance to schedule this appointment.  You may see Minus Breeding, MD , Jory Sims, DNP, [facility]  or one of the following Advanced Practice Providers on your designated Care Team:  Rosaria Ferries, PA-C, Jory Sims, DNP, [place], you and your health needs are our priority.  As part of our continuing mission to provide you with exceptional heart care, we have created designated Provider Care Teams.  These Care Teams include your primary Cardiologist (physician) and Advanced Practice Providers (APPs -  Physician Assistants and Nurse Practitioners) who all work together to provide you with the care you need, when you need it.  Thank you for choosing [facility]!!

## 2018-06-23 ENCOUNTER — Other Ambulatory Visit: Payer: Self-pay | Admitting: Cardiology

## 2018-06-23 MED ORDER — OLMESARTAN MEDOXOMIL 5 MG PO TABS: 2.5000 mg | ORAL_TABLET | Freq: Every day | ORAL | 1 refills | Status: DC

## 2018-06-23 MED ORDER — METOPROLOL TARTRATE 25 MG PO TABS: 12.5000 mg | ORAL_TABLET | Freq: Two times a day (BID) | ORAL | 1 refills | Status: DC

## 2018-06-23 NOTE — Telephone Encounter (Signed)
° ° ° °*  STAT* If patient is at the pharmacy, call can be transferred to refill team.   1. Which medications need to be refilled? (please list name of each medication and dose if known) metoprolol tartrate (LOPRESSOR) 25 MG tablet AND PLEASE SEND olmesartan (BENICAR) 5 MG tablet TO [facility] as well  2. Which pharmacy/location (including street and city if local pharmacy) is medication to be sent to? [facility] [place], [place]  3. Do they need a 30 day or 90 day supply? [place]

## 2018-07-09 IMAGING — CR DG CHEST 2V
2 series · 2 of 2 positions shown · non-contrast
Comparison: 06/11/2017

CLINICAL DATA: Status post CABG

EXAM:
CHEST  2 VIEW

[chest pa]
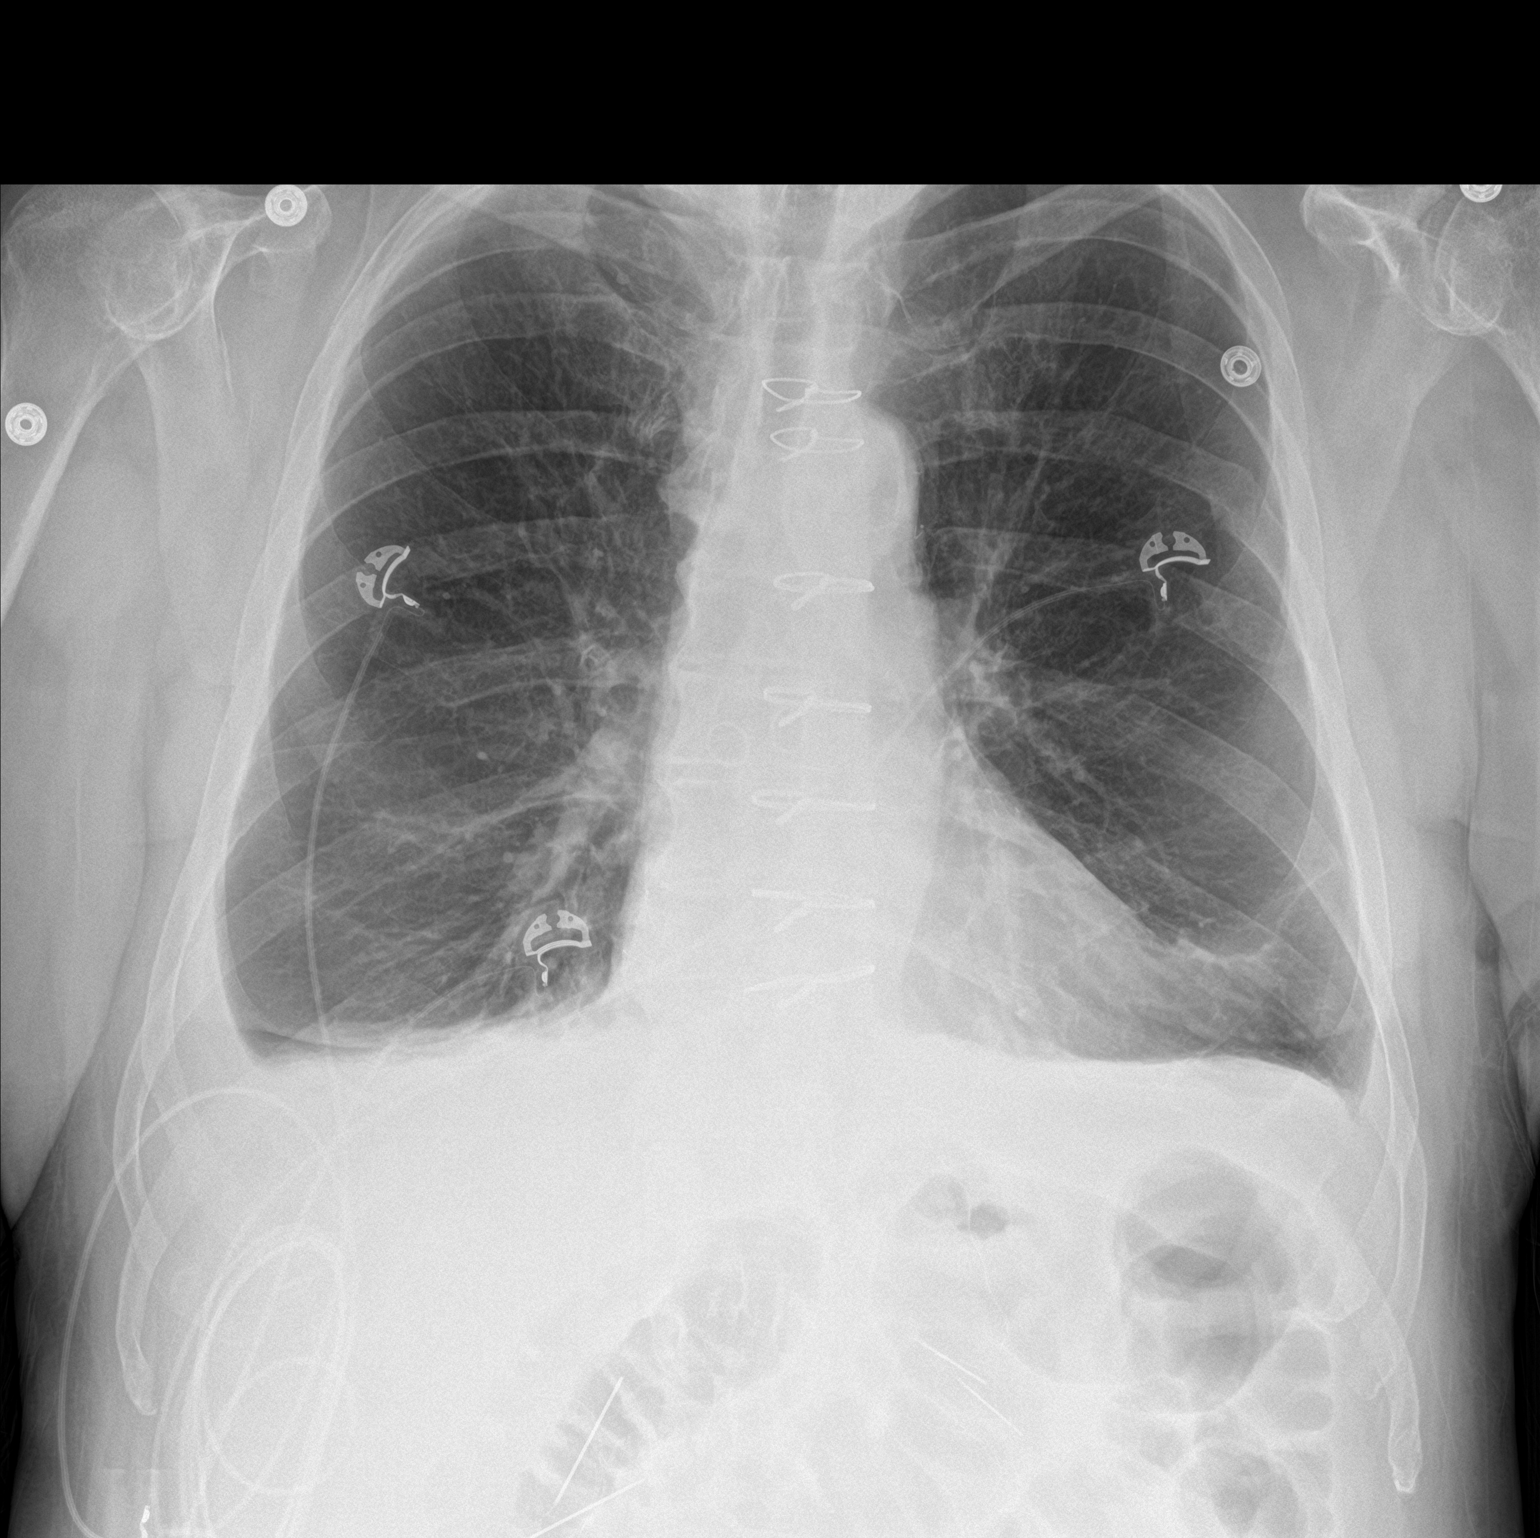

[chest lat]
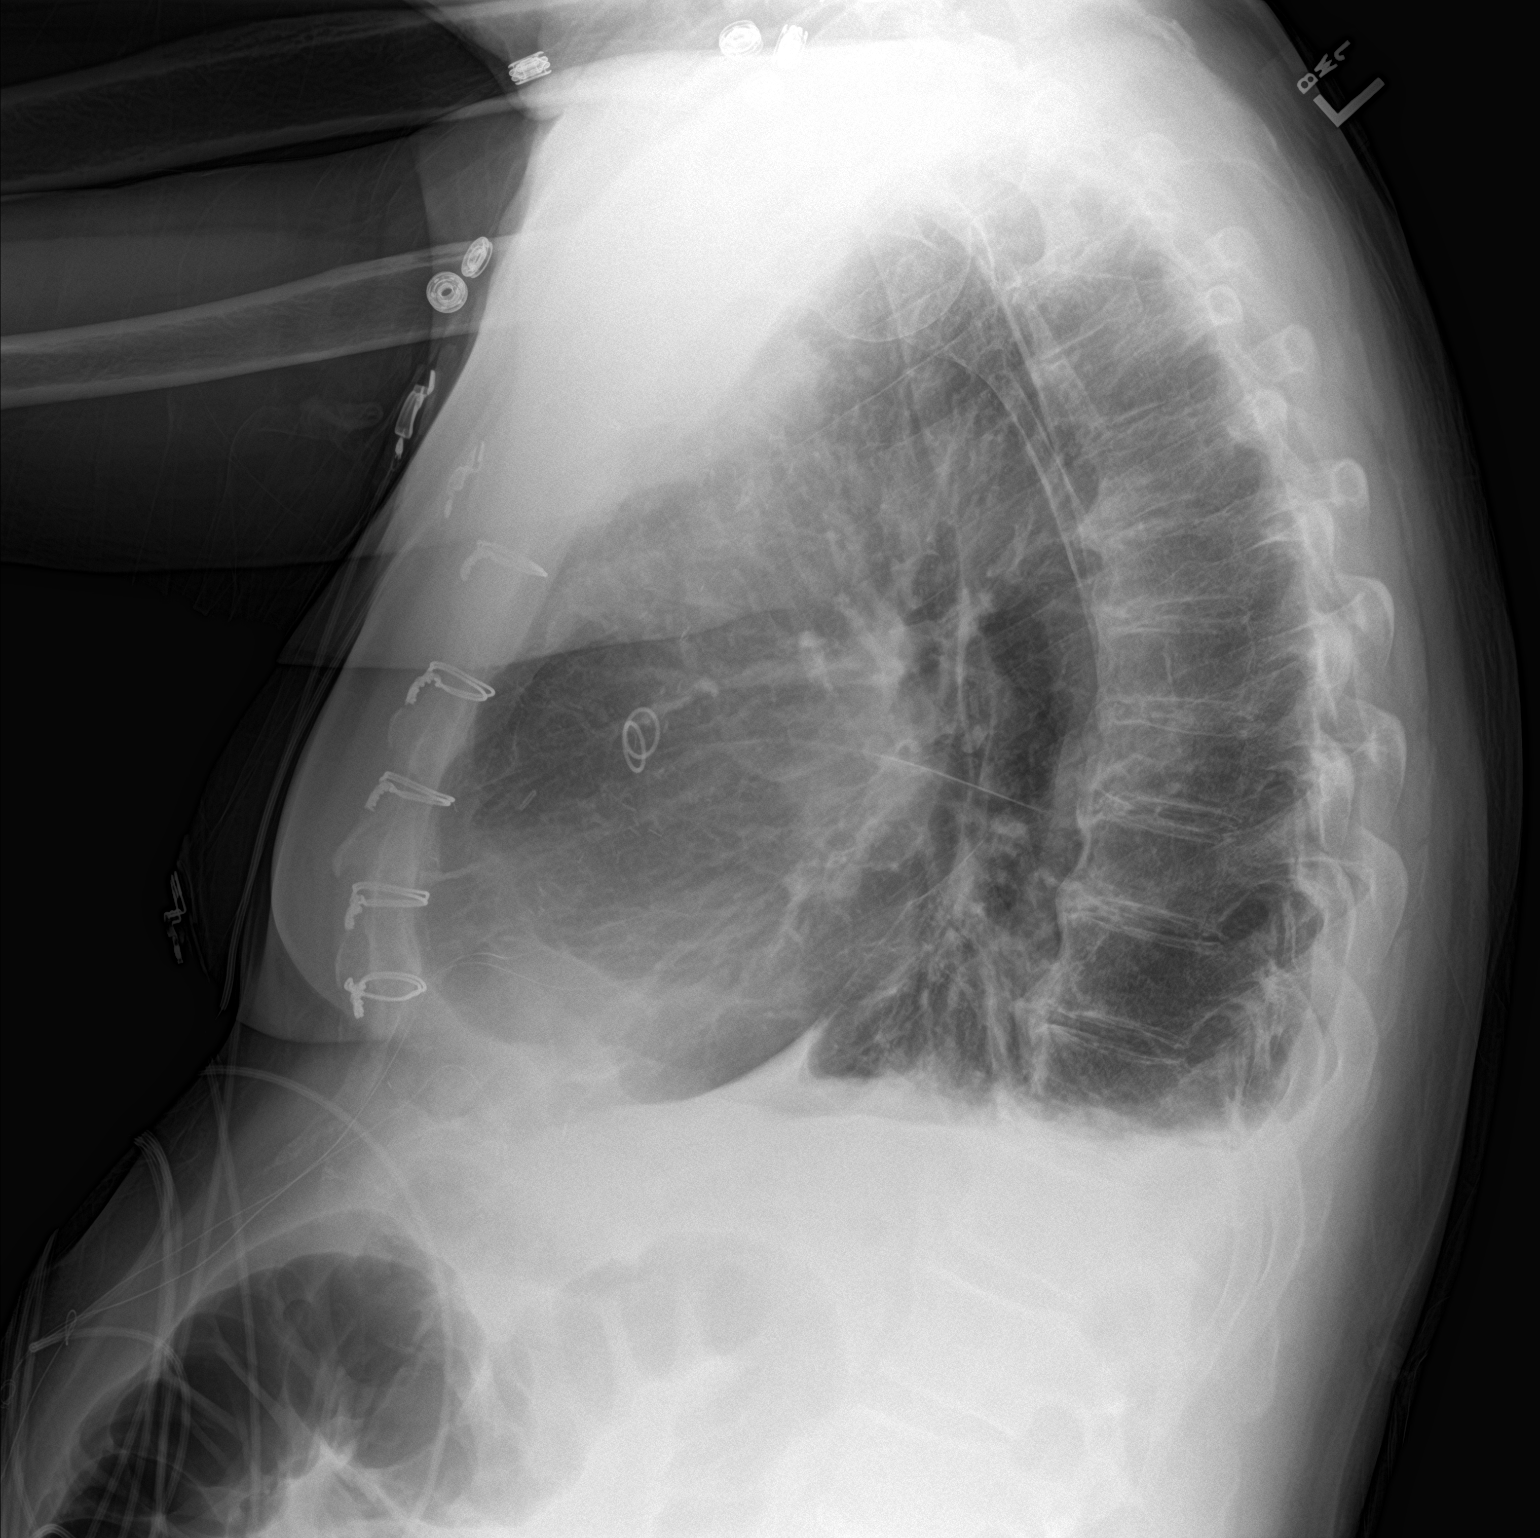

[2 of 2 positions shown; findings below may reference images not displayed]

FINDINGS: Small bilateral pleural effusions with associated bibasilar
scarring/atelectasis. No focal consolidation. No pneumothorax.

The heart is normal in size. Postsurgical changes related to prior
CABG.

Degenerative changes of the visualized thoracolumbar spine. Median
sternotomy.
IMPRESSION: Small bilateral pleural effusions with associated bibasilar
scarring/atelectasis.

No pneumothorax.

## 2018-08-31 ENCOUNTER — Emergency Department (HOSPITAL_COMMUNITY)
Admission: EM | Admit: 2018-08-31 | Discharge: 2018-09-01 | Disposition: A | Discharge status: Home / Self Care | Payer: Medicare Other | Attending: Emergency Medicine | Admitting: Emergency Medicine

## 2018-08-31 ENCOUNTER — Other Ambulatory Visit: Payer: Self-pay

## 2018-08-31 DIAGNOSIS — R072 Precordial pain: Secondary | ICD-10-CM | POA: Insufficient documentation

## 2018-08-31 DIAGNOSIS — Z951 Presence of aortocoronary bypass graft: Secondary | ICD-10-CM | POA: Diagnosis not present

## 2018-08-31 DIAGNOSIS — Z87891 Personal history of nicotine dependence: Secondary | ICD-10-CM | POA: Insufficient documentation

## 2018-08-31 DIAGNOSIS — Z7982 Long term (current) use of aspirin: Secondary | ICD-10-CM | POA: Diagnosis not present

## 2018-08-31 DIAGNOSIS — R0902 Hypoxemia: Secondary | ICD-10-CM | POA: Diagnosis not present

## 2018-08-31 DIAGNOSIS — I10 Essential (primary) hypertension: Secondary | ICD-10-CM | POA: Diagnosis not present

## 2018-08-31 DIAGNOSIS — I252 Old myocardial infarction: Secondary | ICD-10-CM | POA: Insufficient documentation

## 2018-08-31 DIAGNOSIS — Z79899 Other long term (current) drug therapy: Secondary | ICD-10-CM | POA: Insufficient documentation

## 2018-08-31 DIAGNOSIS — I2510 Atherosclerotic heart disease of native coronary artery without angina pectoris: Secondary | ICD-10-CM | POA: Insufficient documentation

## 2018-08-31 DIAGNOSIS — R079 Chest pain, unspecified: Secondary | ICD-10-CM | POA: Diagnosis not present

## 2018-08-31 LAB — CBC WITH DIFFERENTIAL/PLATELET
Abs Immature Granulocytes: 0.01 10*3/uL (ref 0.00–0.07)
Basophils Absolute: 0.1 10*3/uL (ref 0.0–0.1)
Basophils Relative: 1 %
Eosinophils Absolute: 0.3 10*3/uL (ref 0.0–0.5)
Eosinophils Relative: 4 %
HCT: 42.7 % (ref 39.0–52.0)
Hemoglobin: 14.4 g/dL (ref 13.0–17.0)
Immature Granulocytes: 0 %
Lymphocytes Relative: 39 %
Lymphs Abs: 3.1 10*3/uL (ref 0.7–4.0)
MCH: 30.8 pg (ref 26.0–34.0)
MCHC: 33.7 g/dL (ref 30.0–36.0)
MCV: 91.4 fL (ref 80.0–100.0)
Monocytes Absolute: 0.7 10*3/uL (ref 0.1–1.0)
Monocytes Relative: 8 %
Neutro Abs: 3.9 10*3/uL (ref 1.7–7.7)
Neutrophils Relative %: 48 %
Platelets: 251 10*3/uL (ref 150–400)
RBC: 4.67 MIL/uL (ref 4.22–5.81)
RDW: 12.7 % (ref 11.5–15.5)
WBC: 8 10*3/uL (ref 4.0–10.5)
nRBC: 0 % (ref 0.0–0.2)

## 2018-08-31 NOTE — ED Triage Notes (Signed)
Pt arrives via [facility] for chest pain that radiates to the left side of body. While intensity of pain is low patient states he has history of CABG Feb 2019 plus previous MI's that felt similar that were only discovered through MRI and blood work. Denies any N/V/D, SOB.

## 2018-09-01 ENCOUNTER — Telehealth: Payer: Self-pay | Admitting: Cardiology

## 2018-09-01 ENCOUNTER — Emergency Department (HOSPITAL_COMMUNITY): Payer: Medicare Other

## 2018-09-01 DIAGNOSIS — R072 Precordial pain: Secondary | ICD-10-CM | POA: Diagnosis not present

## 2018-09-01 DIAGNOSIS — R079 Chest pain, unspecified: Secondary | ICD-10-CM | POA: Diagnosis not present

## 2018-09-01 LAB — BASIC METABOLIC PANEL
Anion gap: 11 (ref 5–15)
BUN: 17 mg/dL (ref 8–23)
CO2: 23 mmol/L (ref 22–32)
Calcium: 9 mg/dL (ref 8.9–10.3)
Chloride: 104 mmol/L (ref 98–111)
Creatinine, Ser: 1.05 mg/dL (ref 0.61–1.24)
GFR calc Af Amer: >60 mL/min (ref >60)
GFR calc non Af Amer: >60 mL/min (ref >60)
Glucose, Bld: 93 mg/dL (ref 70–99)
Potassium: 3.7 mmol/L (ref 3.5–5.1)
Sodium: 138 mmol/L (ref 135–145)

## 2018-09-01 LAB — TROPONIN I
Troponin I: <0.03 ng/mL (ref <0.03)
Troponin I: <0.03 ng/mL (ref <0.03)

## 2018-09-01 NOTE — Discharge Instructions (Addendum)

## 2018-09-01 NOTE — Telephone Encounter (Signed)
Patient went to ER last night c/o chest pain and lightheadedness and was checked out. They did not find anything wrong but told him he was told he should follow up today with Dr Percival Spanish to see if he was okay to wait till his scheduled appt on 09/29/18 or if Dr Percival Spanish would like to see him sooner.

## 2018-09-01 NOTE — ED Notes (Signed)
Patient verbalized understanding of dc instructions, vss, ambulatory with nad.   

## 2018-09-01 NOTE — Telephone Encounter (Signed)
Pt updated. Virtual visit scheduled for 5/26 at 12:20 with Dr. Percival Spanish.

## 2018-09-01 NOTE — Telephone Encounter (Signed)
Spoke with pt who report he was seen in ED last night for chest pain and lightheadedness. Per chart review, work up was negative but pt state he was advised to f/u with cardiologist. Pt has an appointment scheduled for 6/19. He is calling to see if Dr. Warren Lacy feels he need to be seen sooner. Will route to MD.

## 2018-09-01 NOTE — Telephone Encounter (Signed)
He can be moved up to a virtual visit next week if he wants.

## 2018-09-01 NOTE — ED Provider Notes (Signed)
[facility] EMERGENCY DEPARTMENT Provider Note   CSN: [id number] Arrival date & time: 08/31/18  2304    History   Chief Complaint Chief Complaint  Patient presents with  . Chest Pain    HPI Patrick Nguyen is a 78 y.o. male.     The history is provided by the patient.  Chest Pain  Pain location:  L chest Pain quality: sharp   Pain radiates to:  Does not radiate Pain severity:  Mild Onset quality:  Gradual Timing:  Intermittent Progression:  Resolved Chronicity:  New Worsened by:  Nothing Ineffective treatments:  None tried Associated symptoms: no abdominal pain, no cough, no fever, no lower extremity edema, no nausea, no shortness of breath and no vomiting   Risk factors: coronary artery disease   Risk factors: no prior DVT/PE   PT With history of CAD, GERD presents with chest pain.  He reports over the past day he has had pinpoint sharp chest pain in the left chest.  Denies radiation.  No fever/vomiting.  No shortness of breath.  CP is not brought on by exertion.  The pain is very transient, and he cannot recall what triggers the pain  Past Medical History:  Diagnosis Date  . Anginal pain ([place]) 05/13/2017  . Arthritis   . CAD (coronary artery disease) 05/13/2017   s/p CABG w/ / LIMA-LAD, SVG-OM, SVG-PDA  . Dyspnea    w/ exertion   . Elevated coronary artery calcium score   . GERD (gastroesophageal reflux disease)    hx  . Hepatitis    in   high  school    . Hyperlipidemia   . Hypertension   . Myocardial infarction ([place])   . Rosacea     Patient Active Problem List   Diagnosis Date Noted  . 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 DOE (dyspnea on exertion) 05/13/2017  . Abnormal cardiovascular stress test 05/13/2017  . High coronary artery calcium score 05/13/2017  . Essential hypertension 05/13/2017  . Dyslipidemia 05/13/2017    Past Surgical History:  Procedure Laterality Date  . CARDIAC CATHETERIZATION    . CORONARY ARTERY BYPASS GRAFT N/A  06/07/2017   Procedure: CORONARY ARTERY BYPASS GRAFTING (CABG) x three, using left internal mammary artery and right leg greater saphenous vein harvested endoscopically;  Surgeon: Ivin Poot, MD;  Location: [facility];  Service: Open Heart Surgery;  Laterality: N/A;  . INTRAVASCULAR PRESSURE WIRE/FFR STUDY N/A 05/19/2017   Procedure: INTRAVASCULAR PRESSURE WIRE/FFR STUDY;  Surgeon: [name], MD;  Location: [facility];  Service: Cardiovascular;  Laterality: N/A;  . LEFT HEART CATH AND CORONARY ANGIOGRAPHY N/A 05/19/2017   Procedure: LEFT HEART CATH AND CORONARY ANGIOGRAPHY;  Surgeon: [name], MD;  Location: [place];  Service: Cardiovascular;  Laterality: N/A;  . ROTATOR CUFF REPAIR Left   . TEE WITHOUT CARDIOVERSION N/A 06/07/2017   Procedure: TRANSESOPHAGEAL ECHOCARDIOGRAM (TEE);  Surgeon: Prescott Gum, Collier Salina, MD;  Location: [facility];  Service: Open Heart Surgery;  Laterality: N/A;        Home Medications    Prior to Admission medications   Medication Sig Start Date End Date Taking? Authorizing Provider  aspirin EC 81 MG tablet Take 81 mg by mouth daily.    [provider]  cetirizine (ZYRTEC) 10 MG tablet Take 1 tablet (10 mg total) by mouth daily. 06/22/18   Lendon Colonel, NP  famotidine (PEPCID) 40 MG tablet Take 1 tablet (40 mg total)  by mouth daily. 06/22/18   Lendon Colonel, NP  metoprolol tartrate (LOPRESSOR) 25 MG tablet Take 0.5 tablets (12.5 mg total) by mouth 2 (two) times daily. 06/23/18   Minus Breeding, MD  nitroGLYCERIN (NITROSTAT) 0.4 MG SL tablet Place 1 tablet (0.4 mg total) under the tongue every 5 (five) minutes as needed for chest pain. 03/27/18 06/25/18  Minus Breeding, MD  olmesartan (BENICAR) 5 MG tablet Take 0.5 tablets (2.5 mg total) by mouth daily. 06/23/18   Minus Breeding, MD  rosuvastatin (CRESTOR) 20 MG tablet Take 1 tablet (20 mg total) by mouth every evening. 07/20/17   Ivin Poot, MD    Family History Family  History  Problem Relation Age of Onset  . Heart disease Mother 86  . Hypertension Mother   . Cancer Father        Larynx  . AAA (abdominal aortic aneurysm) Father     Social History Social History   Tobacco Use  . Smoking status: Former Smoker    Last attempt to quit: 05/13/1986    Years since quitting: 32.3  . Smokeless tobacco: Never Used  Substance Use Topics  . Alcohol use: Yes    Alcohol/week: 3.0 standard drinks    Types: 2 Cans of beer, 1 Glasses of wine per week    Comment: daily  . Drug use: No     Allergies   Atorvastatin   Review of Systems Review of Systems  Constitutional: Negative for fever.  Respiratory: Negative for cough and shortness of breath.   Cardiovascular: Positive for chest pain.  Gastrointestinal: Negative for abdominal pain, nausea and vomiting.  All other systems reviewed and are negative.    Physical Exam Updated Vital Signs BP 131/82   Pulse (!) 51   Temp 98.1 F (36.7 C) (Oral)   Resp 17   SpO2 96%   Physical Exam CONSTITUTIONAL: Well developed/well nourished HEAD: Normocephalic/atraumatic EYES: EOMI/PERRL ENMT: Mucous membranes moist NECK: supple no meningeal signs SPINE/BACK:entire spine nontender CV: S1/S2 noted, no murmurs/rubs/gallops noted LUNGS: Lungs are clear to auscultation bilaterally, no apparent distress Chest -no tenderness noted no crepitus ABDOMEN: soft, nontender, no rebound or guarding, bowel sounds noted throughout abdomen GU:no cva tenderness NEURO: Pt is awake/alert/appropriate, moves all extremitiesx4.  No facial droop.   EXTREMITIES: pulses normal/equal, full ROM, no LE edema/tednerness SKIN: warm, color normal PSYCH: no abnormalities of mood noted, alert and oriented to situation   ED Treatments / Results  Labs (all labs ordered are listed, but only abnormal results are displayed) Labs Reviewed  BASIC METABOLIC PANEL  CBC WITH DIFFERENTIAL/PLATELET  TROPONIN I  TROPONIN I    EKG EKG  Interpretation  Date/Time:  Thursday Aug 31 2018 23:13:03 EDT Ventricular Rate:  57 PR Interval:    QRS Duration: 97 QT Interval:  427 QTC Calculation: 416 R Axis:   -26 Text Interpretation:  Sinus rhythm Atrial premature complex Left ventricular hypertrophy Borderline T abnormalities, inferior leads Confirmed by Ripley Fraise [phone]) on 08/31/2018 11:21:50 PM   EKG Interpretation  Date/Time:  Friday Sep 01 2018 03:02:30 EDT Ventricular Rate:  53 PR Interval:    QRS Duration: 100 QT Interval:  460 QTC Calculation: 432 R Axis:   -28 Text Interpretation:  Sinus arrhythmia Probable left ventricular hypertrophy Inferior infarct, old Anterior Q waves, possibly due to LVH No significant change since last tracing Confirmed by Ripley Fraise [id number]) on 09/01/2018 3:22:16 AM       Radiology Dg Chest 2 View  Result Date:  09/01/2018 CLINICAL DATA:  78 year old male with chest pain. EXAM: CHEST - 2 VIEW COMPARISON:  07/20/2017 and earlier. FINDINGS: Chronically large lung volumes. Previous CABG. Normal cardiac size and mediastinal contours. Visualized tracheal air column is within normal limits. No pneumothorax, pulmonary edema, pleural effusion or acute pulmonary opacity. No acute osseous abnormality identified. Negative visible bowel gas pattern. IMPRESSION: No acute cardiopulmonary abnormality. Chronic pulmonary hyperinflation. Electronically Signed   By: Genevie Ann M.D.   On: 09/01/2018 00:24    Procedures Procedures   Medications Ordered in ED Medications - No data to display   Initial Impression / Assessment and Plan / ED Course  I have reviewed the triage vital signs and the nursing notes.  Pertinent labs & imaging results that were available during my care of the patient were reviewed by me and considered in my medical decision making (see chart for details).        12:19 AM Patient with history of CAD, which was found on coronary CT, and ended up having bypass surgery  last year.  He has done well recently, he reports he walks up to a mile a day without any difficulties.  The chest pain tonight is very transient and comes at random.  It has been going on for the past day He is CP free at this time EKG unchanged 1:02 AM He is improved.  He is in no acute distress.  Denies chest pain.  Plan will be to obtain repeat troponin and if negative will be discharged.  Low suspicion for PE or dissection. 3:45 AM Patient remains chest pain-free.  He again reports that the pain episodes at home were very brief, no shortness of breath, no diaphoresis, no vomiting.  He appears very well.  Repeat troponin negative.  EKG does not show any signs of STEMI.  There is some changes from previous EKGs however does not appear to be an acute issue tonight.  He will call his cardiologist later today.  We discussed return precautions Final Clinical Impressions(s) / ED Diagnoses   Final diagnoses:  Precordial pain    ED Discharge Orders    None       Ripley Fraise, MD 09/01/18 [phone]

## 2018-09-04 NOTE — Progress Notes (Signed)
Virtual Visit via Video Note   This visit type was conducted due to national recommendations for restrictions regarding the COVID-19 Pandemic (e.g. social distancing) in an effort to limit this patient's exposure and mitigate transmission in our community.  Due to his co-morbid illnesses, this patient is at least at moderate risk for complications without adequate follow up.  This format is felt to be most appropriate for this patient at this time.  All issues noted in this document were discussed and addressed.  A limited physical exam was performed with this format.  Please refer to the patient's chart for his consent to telehealth for [facility].   Date:  09/05/2018   ID:  Patrick Nguyen, DOB 1940/05/02, MRN [id number]  Patient Location: Home Provider Location: Home  PCP:  [name], MD  Cardiologist:  Minus Breeding, MD  Electrophysiologist:  None   Evaluation Performed:  Follow-Up Visit  Chief Complaint:  Chest pain  History of Present Illness:    Patrick Nguyen is a 78 y.o. male who presents for ongoing assessment and management of CAD with coronary CT score of 400 with 3 vessel distribution. Most recent stress test showed a fixed defect with peri-infarct ischemia. He wore a Holter monitor which revealed non-sustained tachycardia and atrial tachycardia with some PVC's and one 3 beat run of NSVT.   He had follow up cardiac cath which revealed 3 vessel disease, to and was recommended for CABG. This was completed by Dr. Nils Pyle on 06/07/2017, with LIMA to LAD,SVG to OM, SVG to PDA.   Since he was last seen in the ED for chest pain.  I reviewed these records for this visit.    He had no evidence of ischemia with negative enzymes and NL EKGs.  His chest pain was atypical.  It was a thumping throbbing discomfort under his left breast.  He does not necessarily like his previous angina.  There were no dysrhythmias associated with this.  He would never felt like he needed to take a  nitroglycerin like he did before his bypass.  He has been able to go out walking and he did that this morning and yesterday.  He did not bring on symptoms with this.  He might be a little more short of breath than he was previously but overall he thinks he is not having the same symptoms that brought him to the emergency room.  He is not having any presyncope or syncope.  He is not having any PND or orthopnea.  The patient does not have symptoms concerning for COVID-19 infection (fever, chills, cough, or new shortness of breath).    Past Medical History:  Diagnosis Date  . Anginal pain ([place]) 05/13/2017  . Arthritis   . CAD (coronary artery disease) 05/13/2017   s/p CABG w/ / LIMA-LAD, SVG-OM, SVG-PDA  . Dyspnea    w/ exertion   . Elevated coronary artery calcium score   . GERD (gastroesophageal reflux disease)    hx  . Hepatitis    in   high  school    . Hyperlipidemia   . Hypertension   . Myocardial infarction ([place])   . Rosacea    Past Surgical History:  Procedure Laterality Date  . CARDIAC CATHETERIZATION    . CORONARY ARTERY BYPASS GRAFT N/A 06/07/2017   Procedure: CORONARY ARTERY BYPASS GRAFTING (CABG) x three, using left internal mammary artery and right leg greater saphenous vein harvested endoscopically;  Surgeon: Ivin Poot, MD;  Location: [facility];  Service: Open Heart Surgery;  Laterality: N/A;  . INTRAVASCULAR PRESSURE WIRE/FFR STUDY N/A 05/19/2017   Procedure: INTRAVASCULAR PRESSURE WIRE/FFR STUDY;  Surgeon: [name], MD;  Location: [facility];  Service: Cardiovascular;  Laterality: N/A;  . LEFT HEART CATH AND CORONARY ANGIOGRAPHY N/A 05/19/2017   Procedure: LEFT HEART CATH AND CORONARY ANGIOGRAPHY;  Surgeon: [name], MD;  Location: [place] [facility];  Service: Cardiovascular;  Laterality: N/A;  . ROTATOR CUFF REPAIR Left   . TEE WITHOUT CARDIOVERSION N/A 06/07/2017   Procedure: TRANSESOPHAGEAL ECHOCARDIOGRAM (TEE);  Surgeon: Prescott Gum, Collier Salina, MD;   Location: [place];  Service: Open Heart Surgery;  Laterality: N/A;     Prior to Admission medications   Medication Sig Start Date End Date Taking? Authorizing Provider  aspirin EC 81 MG tablet Take 81 mg by mouth daily.   Yes [provider]  cetirizine (ZYRTEC) 10 MG tablet Take 1 tablet (10 mg total) by mouth daily. Patient taking differently: Take 10 mg by mouth daily as needed for allergies.  06/22/18  Yes Lendon Colonel, NP  metoprolol tartrate (LOPRESSOR) 25 MG tablet Take 0.5 tablets (12.5 mg total) by mouth 2 (two) times daily. 06/23/18  Yes Minus Breeding, MD  nitroGLYCERIN (NITROSTAT) 0.4 MG SL tablet Place 1 tablet (0.4 mg total) under the tongue every 5 (five) minutes as needed for chest pain. 03/27/18 09/05/18 Yes Minus Breeding, MD  olmesartan (BENICAR) 5 MG tablet Take 0.5 tablets (2.5 mg total) by mouth daily. 06/23/18  Yes Minus Breeding, MD  rosuvastatin (CRESTOR) 20 MG tablet Take 1 tablet (20 mg total) by mouth every evening. 07/20/17  Yes Ivin Poot, MD    Allergies:   Atorvastatin   Social History   Tobacco Use  . Smoking status: Former Smoker    Last attempt to quit: 05/13/1986    Years since quitting: 32.3  . Smokeless tobacco: Never Used  Substance Use Topics  . Alcohol use: Yes    Alcohol/week: 3.0 standard drinks    Types: 2 Cans of beer, 1 Glasses of wine per week    Comment: daily  . Drug use: No     Family Hx: The patient's family history includes AAA (abdominal aortic aneurysm) in his father; Cancer in his father; Heart disease (age of onset: 2) in his mother; Hypertension in his mother.  ROS:   Please see the history of present illness.    As stated in the HPI and negative for all other systems.   Prior CV studies:   The following studies were reviewed today:    Labs/Other Tests and Data Reviewed:    EKG:  NSR, rate 57, no acute ST T wave changes.    09/01/18  Recent Labs: 09/26/2017: ALT 25 08/31/2018: BUN 17; Creatinine,  Ser 1.05; Hemoglobin 14.4; Platelets 251; Potassium 3.7; Sodium 138   Recent Lipid Panel Lab Results  Component Value Date/Time   CHOL 134 09/26/2017 08:38 AM   TRIG 73 09/26/2017 08:38 AM   HDL 70 09/26/2017 08:38 AM   CHOLHDL 1.9 09/26/2017 08:38 AM   LDLCALC 49 09/26/2017 08:38 AM    Wt Readings from Last 3 Encounters:  09/05/18 160 lb (72.6 kg)  06/22/18 160 lb 12.8 oz (72.9 kg)  03/27/18 163 lb 9.6 oz (74.2 kg)     Objective:    Vital Signs:  Ht 5\' 10"  (1.778 m)   Wt 160 lb (72.6 kg)   BMI 22.96 kg/m    VITAL  SIGNS:  reviewed GEN:  no acute distress EYES:  sclerae anicteric, EOMI - Extraocular Movements Intact NEURO:  alert and oriented x 3, no obvious focal deficit PSYCH:  normal affect  ASSESSMENT & PLAN:    CAD/CABG:   The symptoms that he is describing above were atypical and not like his previous angina.  They were significant on the day he presented but not since.  There was no objective evidence of ischemia.  I have reviewed the records.  Given all this I do not think that further cardiovascular testing is suggested.  However, he can let me know if he has any recurrent symptoms at which point I probably would add a long-acting nitrate.  DYSLIPIDEMIA:    LDL is 49 and HDL 70.  He will continue meds as listed.  HTN:  The blood pressure is at target in the hospital. Continue current therapy.   ATRIAL FIB:     This was post CABG.   He has had no symptomatic recurrence of this.  No further monitoring at this time.   COVID-19 Education: The signs and symptoms of COVID-19 were discussed with the patient and how to seek care for testing (follow up with PCP or arrange E-visit).  The importance of social distancing was discussed today.  Time:   Today, I have spent 35 minutes with the patient with telehealth technology discussing the above problems.     Medication Adjustments/Labs and Tests Ordered: Current medicines are reviewed at length with the patient today.   Concerns regarding medicines are outlined above.   Tests Ordered: No orders of the defined types were placed in this encounter.   Medication Changes: No orders of the defined types were placed in this encounter.   Disposition:  Follow up with me in 3 months   Signed, Minus Breeding, MD  09/05/2018 1:34 PM    [facility]

## 2018-09-05 ENCOUNTER — Encounter: Payer: Self-pay | Admitting: Cardiology

## 2018-09-05 ENCOUNTER — Telehealth (INDEPENDENT_AMBULATORY_CARE_PROVIDER_SITE_OTHER): Payer: Medicare Other | Admitting: Cardiology

## 2018-09-05 VITALS — Ht 70.0 in | Wt 160.0 lb

## 2018-09-05 DIAGNOSIS — I2510 Atherosclerotic heart disease of native coronary artery without angina pectoris: Secondary | ICD-10-CM | POA: Diagnosis not present

## 2018-09-05 DIAGNOSIS — R0789 Other chest pain: Secondary | ICD-10-CM | POA: Diagnosis not present

## 2018-09-05 DIAGNOSIS — E785 Hyperlipidemia, unspecified: Secondary | ICD-10-CM | POA: Diagnosis not present

## 2018-09-05 DIAGNOSIS — R079 Chest pain, unspecified: Secondary | ICD-10-CM

## 2018-09-05 DIAGNOSIS — Z7189 Other specified counseling: Secondary | ICD-10-CM

## 2018-09-05 DIAGNOSIS — I10 Essential (primary) hypertension: Secondary | ICD-10-CM | POA: Diagnosis not present

## 2018-09-05 NOTE — Patient Instructions (Signed)
Medication Instructions:  Continue current medications  If you need a refill on your cardiac medications before your next appointment, please call your pharmacy.  Labwork: None Ordered   Testing/Procedures: None Ordered  Follow-Up: . Your physician recommends that you schedule a follow-up appointment in: 3 Months   At [facility], you and your health needs are our priority.  As part of our continuing mission to provide you with exceptional heart care, we have created designated Provider Care Teams.  These Care Teams include your primary Cardiologist (physician) and Advanced Practice Providers (APPs -  Physician Assistants and Nurse Practitioners) who all work together to provide you with the care you need, when you need it.  Thank you for choosing [facility] at [facility]!!

## 2018-09-29 ENCOUNTER — Ambulatory Visit: Payer: Medicare Other | Admitting: Cardiology

## 2018-11-18 NOTE — Progress Notes (Signed)
Cardiology Office Note   Date:  11/20/2018   ID:  Patrick Nguyen, DOB [birth year], MRN [id number]  PCP:  [name], MD  Cardiologist:   Minus Breeding, MD    Chief Complaint  Patient presents with  . Coronary Artery Disease      History of Present Illness: Patrick Nguyen is a 78 y.o. male  who presents for ongoing assessment and management of CAD with coronary CT score of 400 with 3 vessel distribution. Most recent stress test showed a fixed defect with peri-infarct ischemia. He wore a Holter monitor which revealed non-sustained tachycardia and atrial tachycardia with some PVC's and one 3 beat run of NSVT.   He had follow up cardiac cath which revealed 3 vessel disease, to and was recommended for CABG. This was completed by Dr. Nils Pyle on 06/07/2017, with LIMA to LAD,SVG to OM, SVG to PDA.    I saw him "virtually" because he been in the emergency room with chest discomfort earlier this year.  This was felt to be atypical.  He returned in person for follow-up and he is actually done quite well.  He is on walking a mile daily. The patient denies any new symptoms such as chest discomfort, neck or arm discomfort. There has been no new shortness of breath, PND or orthopnea. There have been no reported palpitations, presyncope or syncope.    Past Medical History:  Diagnosis Date  . Anginal pain ([place]) 05/13/2017  . Arthritis   . CAD (coronary artery disease) 05/13/2017   s/p CABG w/ / LIMA-LAD, SVG-OM, SVG-PDA  . Dyspnea    w/ exertion   . Elevated coronary artery calcium score   . GERD (gastroesophageal reflux disease)    hx  . Hepatitis    in   high  school    . Hyperlipidemia   . Hypertension   . Myocardial infarction ([place])   . Rosacea     Past Surgical History:  Procedure Laterality Date  . CARDIAC CATHETERIZATION    . CORONARY ARTERY BYPASS GRAFT N/A 06/07/2017   Procedure: CORONARY ARTERY BYPASS GRAFTING (CABG) x three, using left internal mammary artery and  right leg greater saphenous vein harvested endoscopically;  Surgeon: Ivin Poot, MD;  Location: [place];  Service: Open Heart Surgery;  Laterality: N/A;  . INTRAVASCULAR PRESSURE WIRE/FFR STUDY N/A 05/19/2017   Procedure: INTRAVASCULAR PRESSURE WIRE/FFR STUDY;  Surgeon: [name], MD;  Location: [place] [facility];  Service: Cardiovascular;  Laterality: N/A;  . LEFT HEART CATH AND CORONARY ANGIOGRAPHY N/A 05/19/2017   Procedure: LEFT HEART CATH AND CORONARY ANGIOGRAPHY;  Surgeon: [name], MD;  Location: [place] [facility];  Service: Cardiovascular;  Laterality: N/A;  . ROTATOR CUFF REPAIR Left   . TEE WITHOUT CARDIOVERSION N/A 06/07/2017   Procedure: TRANSESOPHAGEAL ECHOCARDIOGRAM (TEE);  Surgeon: Prescott Gum, Collier Salina, MD;  Location: [place];  Service: Open Heart Surgery;  Laterality: N/A;     Current Outpatient Medications  Medication Sig Dispense Refill  . aspirin EC 81 MG tablet Take 81 mg by mouth daily.    . metoprolol tartrate (LOPRESSOR) 25 MG tablet Take 0.5 tablets (12.5 mg total) by mouth 2 (two) times daily. 90 tablet 1  . nitroGLYCERIN (NITROSTAT) 0.4 MG SL tablet Place 1 tablet (0.4 mg total) under the tongue every 5 (five) minutes as needed for chest pain. 25 tablet 3  . olmesartan (BENICAR) 5 MG tablet Take 0.5 tablets (2.5 mg total) by mouth daily. [place]  tablet 1  . rosuvastatin (CRESTOR) 20 MG tablet Take 1 tablet (20 mg total) by mouth every evening. 30 tablet 2   No current facility-administered medications for this visit.     Allergies:   Atorvastatin    ROS:  Please see the history of present illness.   Otherwise, review of systems are positive for none.   All other systems are reviewed and negative.    PHYSICAL EXAM: VS:  BP (!) 120/56 (BP Location: Left Arm, Patient Position: Sitting, Cuff Size: Normal)   Pulse (!) 55   Temp (!) 97.2 F (36.2 C)   Ht 5\' 10"  (1.778 m)   Wt 164 lb (74.4 kg)   BMI 23.53 kg/m  , BMI Body mass index is 23.53 kg/m. GENERAL:   Well appearing NECK:  No jugular venous distention, waveform within normal limits, carotid upstroke brisk and symmetric, no bruits, no thyromegaly LUNGS:  Clear to auscultation bilaterally CHEST:  Well healed sternotomy scar. HEART:  PMI not displaced or sustained,S1 and S2 within normal limits, no S3, no S4, no clicks, no rubs, no murmurs ABD:  Flat, positive bowel sounds normal in frequency in pitch, no bruits, no rebound, no guarding, no midline pulsatile mass, no hepatomegaly, no splenomegaly EXT:  2 plus pulses throughout, no edema, no cyanosis no clubbing   EKG:  EKG is not ordered today.    Recent Labs: 08/31/2018: BUN 17; Creatinine, Ser 1.05; Hemoglobin 14.4; Platelets 251; Potassium 3.7; Sodium 138    Lipid Panel    Component Value Date/Time   CHOL 134 09/26/2017 0838   TRIG 73 09/26/2017 0838   HDL 70 09/26/2017 0838   CHOLHDL 1.9 09/26/2017 0838   LDLCALC 49 09/26/2017 0838      Wt Readings from Last 3 Encounters:  11/20/18 164 lb (74.4 kg)  09/05/18 160 lb (72.6 kg)  06/22/18 160 lb 12.8 oz (72.9 kg)      Other studies Reviewed: Additional studies/ records that were reviewed today include: None. Review of the above records demonstrates:  Please see elsewhere in the note.     ASSESSMENT AND PLAN:  CAD/CABG:  The patient has no new sypmtoms.  No further cardiovascular testing is indicated.  We will continue with aggressive risk reduction and meds as listed.  DYSLIPIDEMIA:LDL is 49 and HDL 70.   He will continue with meds as listed.   HTN: The blood pressure is at target.  No change in therapy.    Current medicines are reviewed at length with the patient today.  The patient does not have concerns regarding medicines.  The following changes have been made:  no change  Labs/ tests ordered today include: None No orders of the defined types were placed in this encounter.    Disposition:   FU with me in one year.     Signed, Minus Breeding,  MD  11/20/2018 1:34 PM    [facility]

## 2018-11-20 ENCOUNTER — Ambulatory Visit (INDEPENDENT_AMBULATORY_CARE_PROVIDER_SITE_OTHER): Payer: Medicare Other | Admitting: Cardiology

## 2018-11-20 ENCOUNTER — Encounter: Payer: Self-pay | Admitting: Cardiology

## 2018-11-20 ENCOUNTER — Other Ambulatory Visit: Payer: Self-pay

## 2018-11-20 VITALS — BP 120/56 | HR 55 | Temp 97.2°F | Ht 70.0 in | Wt 164.0 lb

## 2018-11-20 DIAGNOSIS — I10 Essential (primary) hypertension: Secondary | ICD-10-CM | POA: Diagnosis not present

## 2018-11-20 DIAGNOSIS — I2510 Atherosclerotic heart disease of native coronary artery without angina pectoris: Secondary | ICD-10-CM

## 2018-11-20 NOTE — Patient Instructions (Signed)

## 2018-12-18 ENCOUNTER — Other Ambulatory Visit: Payer: Self-pay | Admitting: Cardiology

## 2018-12-28 DIAGNOSIS — Z23 Encounter for immunization: Secondary | ICD-10-CM | POA: Diagnosis not present

## 2019-01-08 DIAGNOSIS — J40 Bronchitis, not specified as acute or chronic: Secondary | ICD-10-CM | POA: Diagnosis not present

## 2019-01-08 DIAGNOSIS — I25810 Atherosclerosis of coronary artery bypass graft(s) without angina pectoris: Secondary | ICD-10-CM | POA: Diagnosis not present

## 2019-01-08 DIAGNOSIS — I10 Essential (primary) hypertension: Secondary | ICD-10-CM | POA: Diagnosis not present

## 2019-01-08 DIAGNOSIS — I4891 Unspecified atrial fibrillation: Secondary | ICD-10-CM | POA: Diagnosis not present

## 2019-01-08 DIAGNOSIS — E785 Hyperlipidemia, unspecified: Secondary | ICD-10-CM | POA: Diagnosis not present

## 2019-01-12 DIAGNOSIS — J40 Bronchitis, not specified as acute or chronic: Secondary | ICD-10-CM | POA: Diagnosis not present

## 2019-01-12 DIAGNOSIS — I4891 Unspecified atrial fibrillation: Secondary | ICD-10-CM | POA: Diagnosis not present

## 2019-01-12 DIAGNOSIS — I25810 Atherosclerosis of coronary artery bypass graft(s) without angina pectoris: Secondary | ICD-10-CM | POA: Diagnosis not present

## 2019-01-12 DIAGNOSIS — I10 Essential (primary) hypertension: Secondary | ICD-10-CM | POA: Diagnosis not present

## 2019-01-12 DIAGNOSIS — E785 Hyperlipidemia, unspecified: Secondary | ICD-10-CM | POA: Diagnosis not present

## 2019-01-25 DIAGNOSIS — Z23 Encounter for immunization: Secondary | ICD-10-CM | POA: Diagnosis not present

## 2019-03-16 ENCOUNTER — Other Ambulatory Visit: Payer: Self-pay | Admitting: Cardiology

## 2019-04-24 DIAGNOSIS — I10 Essential (primary) hypertension: Secondary | ICD-10-CM | POA: Diagnosis not present

## 2019-04-24 DIAGNOSIS — J40 Bronchitis, not specified as acute or chronic: Secondary | ICD-10-CM | POA: Diagnosis not present

## 2019-04-24 DIAGNOSIS — I25810 Atherosclerosis of coronary artery bypass graft(s) without angina pectoris: Secondary | ICD-10-CM | POA: Diagnosis not present

## 2019-04-24 DIAGNOSIS — E785 Hyperlipidemia, unspecified: Secondary | ICD-10-CM | POA: Diagnosis not present

## 2019-04-24 DIAGNOSIS — I4891 Unspecified atrial fibrillation: Secondary | ICD-10-CM | POA: Diagnosis not present

## 2019-05-01 DIAGNOSIS — Z23 Encounter for immunization: Secondary | ICD-10-CM | POA: Diagnosis not present

## 2019-05-01 DIAGNOSIS — I25810 Atherosclerosis of coronary artery bypass graft(s) without angina pectoris: Secondary | ICD-10-CM | POA: Diagnosis not present

## 2019-05-01 DIAGNOSIS — I10 Essential (primary) hypertension: Secondary | ICD-10-CM | POA: Diagnosis not present

## 2019-05-01 DIAGNOSIS — Z0000 Encounter for general adult medical examination without abnormal findings: Secondary | ICD-10-CM | POA: Diagnosis not present

## 2019-05-01 DIAGNOSIS — Z1389 Encounter for screening for other disorder: Secondary | ICD-10-CM | POA: Diagnosis not present

## 2019-05-01 DIAGNOSIS — E785 Hyperlipidemia, unspecified: Secondary | ICD-10-CM | POA: Diagnosis not present

## 2019-05-01 DIAGNOSIS — M7630 Iliotibial band syndrome, unspecified leg: Secondary | ICD-10-CM | POA: Diagnosis not present

## 2019-05-01 DIAGNOSIS — Z79899 Other long term (current) drug therapy: Secondary | ICD-10-CM | POA: Diagnosis not present

## 2019-05-01 DIAGNOSIS — I4891 Unspecified atrial fibrillation: Secondary | ICD-10-CM | POA: Diagnosis not present

## 2019-05-01 DIAGNOSIS — G500 Trigeminal neuralgia: Secondary | ICD-10-CM | POA: Diagnosis not present

## 2019-05-20 ENCOUNTER — Ambulatory Visit: Payer: Medicare Other

## 2019-05-25 DIAGNOSIS — I4891 Unspecified atrial fibrillation: Secondary | ICD-10-CM | POA: Diagnosis not present

## 2019-05-25 DIAGNOSIS — E785 Hyperlipidemia, unspecified: Secondary | ICD-10-CM | POA: Diagnosis not present

## 2019-05-25 DIAGNOSIS — J40 Bronchitis, not specified as acute or chronic: Secondary | ICD-10-CM | POA: Diagnosis not present

## 2019-05-25 DIAGNOSIS — I10 Essential (primary) hypertension: Secondary | ICD-10-CM | POA: Diagnosis not present

## 2019-05-25 DIAGNOSIS — I25810 Atherosclerosis of coronary artery bypass graft(s) without angina pectoris: Secondary | ICD-10-CM | POA: Diagnosis not present

## 2019-05-30 DIAGNOSIS — H02055 Trichiasis without entropian left lower eyelid: Secondary | ICD-10-CM | POA: Diagnosis not present

## 2019-05-30 DIAGNOSIS — H02054 Trichiasis without entropian left upper eyelid: Secondary | ICD-10-CM | POA: Diagnosis not present

## 2019-05-30 DIAGNOSIS — Z961 Presence of intraocular lens: Secondary | ICD-10-CM | POA: Diagnosis not present

## 2019-06-06 ENCOUNTER — Ambulatory Visit: Payer: Medicare Other

## 2019-06-21 ENCOUNTER — Other Ambulatory Visit: Payer: Self-pay | Admitting: Adult Health

## 2019-06-28 DIAGNOSIS — J40 Bronchitis, not specified as acute or chronic: Secondary | ICD-10-CM | POA: Diagnosis not present

## 2019-06-28 DIAGNOSIS — E785 Hyperlipidemia, unspecified: Secondary | ICD-10-CM | POA: Diagnosis not present

## 2019-06-28 DIAGNOSIS — I10 Essential (primary) hypertension: Secondary | ICD-10-CM | POA: Diagnosis not present

## 2019-06-28 DIAGNOSIS — I4891 Unspecified atrial fibrillation: Secondary | ICD-10-CM | POA: Diagnosis not present

## 2019-06-28 DIAGNOSIS — I25810 Atherosclerosis of coronary artery bypass graft(s) without angina pectoris: Secondary | ICD-10-CM | POA: Diagnosis not present

## 2019-07-13 DIAGNOSIS — J40 Bronchitis, not specified as acute or chronic: Secondary | ICD-10-CM | POA: Diagnosis not present

## 2019-07-13 DIAGNOSIS — E785 Hyperlipidemia, unspecified: Secondary | ICD-10-CM | POA: Diagnosis not present

## 2019-07-13 DIAGNOSIS — I25810 Atherosclerosis of coronary artery bypass graft(s) without angina pectoris: Secondary | ICD-10-CM | POA: Diagnosis not present

## 2019-07-13 DIAGNOSIS — I10 Essential (primary) hypertension: Secondary | ICD-10-CM | POA: Diagnosis not present

## 2019-07-13 DIAGNOSIS — I4891 Unspecified atrial fibrillation: Secondary | ICD-10-CM | POA: Diagnosis not present

## 2019-07-26 DIAGNOSIS — R194 Change in bowel habit: Secondary | ICD-10-CM | POA: Diagnosis not present

## 2019-07-26 DIAGNOSIS — R195 Other fecal abnormalities: Secondary | ICD-10-CM | POA: Diagnosis not present

## 2019-07-26 DIAGNOSIS — K529 Noninfective gastroenteritis and colitis, unspecified: Secondary | ICD-10-CM | POA: Diagnosis not present

## 2019-07-27 DIAGNOSIS — Z8679 Personal history of other diseases of the circulatory system: Secondary | ICD-10-CM | POA: Diagnosis not present

## 2019-07-27 DIAGNOSIS — R195 Other fecal abnormalities: Secondary | ICD-10-CM | POA: Diagnosis not present

## 2019-08-31 DIAGNOSIS — I25810 Atherosclerosis of coronary artery bypass graft(s) without angina pectoris: Secondary | ICD-10-CM | POA: Diagnosis not present

## 2019-08-31 DIAGNOSIS — I10 Essential (primary) hypertension: Secondary | ICD-10-CM | POA: Diagnosis not present

## 2019-08-31 DIAGNOSIS — E785 Hyperlipidemia, unspecified: Secondary | ICD-10-CM | POA: Diagnosis not present

## 2019-08-31 DIAGNOSIS — J209 Acute bronchitis, unspecified: Secondary | ICD-10-CM | POA: Diagnosis not present

## 2019-08-31 DIAGNOSIS — I4891 Unspecified atrial fibrillation: Secondary | ICD-10-CM | POA: Diagnosis not present

## 2019-09-11 DIAGNOSIS — Z1159 Encounter for screening for other viral diseases: Secondary | ICD-10-CM | POA: Diagnosis not present

## 2019-09-14 DIAGNOSIS — D124 Benign neoplasm of descending colon: Secondary | ICD-10-CM | POA: Diagnosis not present

## 2019-09-14 DIAGNOSIS — K648 Other hemorrhoids: Secondary | ICD-10-CM | POA: Diagnosis not present

## 2019-09-14 DIAGNOSIS — R195 Other fecal abnormalities: Secondary | ICD-10-CM | POA: Diagnosis not present

## 2019-09-14 DIAGNOSIS — K621 Rectal polyp: Secondary | ICD-10-CM | POA: Diagnosis not present

## 2019-09-14 DIAGNOSIS — K5730 Diverticulosis of large intestine without perforation or abscess without bleeding: Secondary | ICD-10-CM | POA: Diagnosis not present

## 2019-09-14 DIAGNOSIS — K52832 Lymphocytic colitis: Secondary | ICD-10-CM | POA: Diagnosis not present

## 2019-09-18 DIAGNOSIS — K621 Rectal polyp: Secondary | ICD-10-CM | POA: Diagnosis not present

## 2019-09-18 DIAGNOSIS — D124 Benign neoplasm of descending colon: Secondary | ICD-10-CM | POA: Diagnosis not present

## 2019-09-18 DIAGNOSIS — K52832 Lymphocytic colitis: Secondary | ICD-10-CM | POA: Diagnosis not present

## 2019-10-25 DIAGNOSIS — K52832 Lymphocytic colitis: Secondary | ICD-10-CM | POA: Diagnosis not present

## 2019-10-25 DIAGNOSIS — R7401 Elevation of levels of liver transaminase levels: Secondary | ICD-10-CM | POA: Diagnosis not present

## 2019-11-03 DIAGNOSIS — I4891 Unspecified atrial fibrillation: Secondary | ICD-10-CM | POA: Diagnosis not present

## 2019-11-03 DIAGNOSIS — I25810 Atherosclerosis of coronary artery bypass graft(s) without angina pectoris: Secondary | ICD-10-CM | POA: Diagnosis not present

## 2019-11-03 DIAGNOSIS — J40 Bronchitis, not specified as acute or chronic: Secondary | ICD-10-CM | POA: Diagnosis not present

## 2019-11-03 DIAGNOSIS — E785 Hyperlipidemia, unspecified: Secondary | ICD-10-CM | POA: Diagnosis not present

## 2019-11-03 DIAGNOSIS — I10 Essential (primary) hypertension: Secondary | ICD-10-CM | POA: Diagnosis not present

## 2019-11-24 NOTE — Progress Notes (Signed)
Cardiology Office Note   Date:  11/26/2019   ID:  Patrick Nguyen, DOB Nov 18, 1940, MRN [id number]  PCP:  [name], MD  Cardiologist:   Minus Breeding, MD    Chief Complaint  Patient presents with  . Coronary Artery Disease      History of Present Illness: Patrick Nguyen is a 79 y.o. male  who presents for follow up of CAD with LIMA to LAD,SVG to OM, SVG to PDA 2019.    Since I last saw him he has done well.  The patient denies any new symptoms such as chest discomfort, neck or arm discomfort. There has been no new shortness of breath, PND or orthopnea. There have been no reported palpitations, presyncope or syncope.  He is active walking and golfing.  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    Past Medical History:  Diagnosis Date  . Anginal pain ([place]) 05/13/2017  . Arthritis   . CAD (coronary artery disease) 05/13/2017   s/p CABG w/ / LIMA-LAD, SVG-OM, SVG-PDA  . Dyspnea    w/ exertion   . Elevated coronary artery calcium score   . GERD (gastroesophageal reflux disease)    hx  . Hepatitis    in   high  school    . Hyperlipidemia   . Hypertension   . Myocardial infarction ([place])   . Rosacea     Past Surgical History:  Procedure Laterality Date  . CARDIAC CATHETERIZATION    . CORONARY ARTERY BYPASS GRAFT N/A 06/07/2017   Procedure: CORONARY ARTERY BYPASS GRAFTING (CABG) x three, using left internal mammary artery and right leg greater saphenous vein harvested endoscopically;  Surgeon: Ivin Poot, MD;  Location: [place];  Service: Open Heart Surgery;  Laterality: N/A;  . INTRAVASCULAR PRESSURE WIRE/FFR STUDY N/A 05/19/2017   Procedure: INTRAVASCULAR PRESSURE WIRE/FFR STUDY;  Surgeon: [name], MD;  Location: [place] [facility];  Service: Cardiovascular;  Laterality: N/A;  . LEFT HEART CATH AND CORONARY ANGIOGRAPHY N/A 05/19/2017   Procedure: LEFT HEART CATH AND CORONARY ANGIOGRAPHY;  Surgeon: [name], MD;  Location: [place] [facility];  Service: Cardiovascular;  Laterality: N/A;  . ROTATOR CUFF REPAIR Left   . TEE WITHOUT CARDIOVERSION N/A 06/07/2017   Procedure: TRANSESOPHAGEAL ECHOCARDIOGRAM (TEE);  Surgeon: Prescott Gum, Collier Salina, MD;  Location: [place];  Service: Open Heart Surgery;  Laterality: N/A;     Current Outpatient Medications  Medication Sig Dispense Refill  . aspirin EC 81 MG tablet Take 81 mg by mouth daily.    . metoprolol tartrate (LOPRESSOR) 25 MG tablet TAKE 1/2 TABLET BY MOUTH TWICE A DAY 90 tablet 3  . olmesartan (BENICAR) 5 MG tablet TAKE 1/2 TABLET BY MOUTH DAILY 45 tablet 1  . rosuvastatin (CRESTOR) 20 MG tablet Take 1 tablet (20 mg total) by mouth every evening. 30 tablet 2  . nitroGLYCERIN (NITROSTAT) 0.4 MG SL tablet Place 1 tablet (0.4 mg total) under the tongue every 5 (five) minutes as needed for chest pain. 25 tablet 3   No current facility-administered medications for this visit.    Allergies:   Atorvastatin    ROS:  Please see the history of present illness.   Otherwise, review of systems are positive for arthritis in his hands.   All other systems are reviewed and negative.    PHYSICAL EXAM: VS:  BP 108/60 (BP Location: Left Arm, Patient Position: Sitting, Cuff Size: Normal)  Pulse (!) 55   Ht 5\' 10"  (1.778 m)   Wt 166 lb 9.6 oz (75.6 kg)   BMI 23.90 kg/m  , BMI Body mass index is 23.9 kg/m. GENERAL:  Well appearing NECK:  No jugular venous distention, waveform within normal limits, carotid upstroke brisk and symmetric, no bruits, no thyromegaly LUNGS:  Clear to auscultation bilaterally CHEST:  Well healed sternotomy scar. HEART:  PMI not displaced or sustained,S1 and S2 within normal limits, no S3, no S4, no clicks, no rubs, no murmurs ABD:  Flat, positive bowel sounds normal in frequency in pitch, no bruits, no rebound, no guarding, no midline pulsatile mass, no hepatomegaly, no splenomegaly EXT:  2 plus pulses throughout, no edema, no cyanosis no clubbing   EKG:   EKG  ordered today. Sinus bradycardia, rate 55, axis within normal limits, intervals within normal limits, no acute ST-T wave changes.   Recent Labs: No results found for requested labs within last 8760 hours.     Wt Readings from Last 3 Encounters:  11/26/19 166 lb 9.6 oz (75.6 kg)  11/20/18 164 lb (74.4 kg)  09/05/18 160 lb (72.6 kg)      Other studies Reviewed: Additional studies/ records that were reviewed today include: Labs. Review of the above records demonstrates:  Please see elsewhere in the note.     ASSESSMENT AND PLAN:  CAD/CABG:  The patient has no new sypmtoms.  No further cardiovascular testing is indicated.  We will continue with aggressive risk reduction and meds as listed.  DYSLIPIDEMIA:LDL is 65 and HDL 76.  No change in therapy.   HTN: The blood pressure is at target.  No change in therapy.   COVID EDUCATION:   The patient has been vaccinated.  Current medicines are reviewed at length with the patient today.  The patient does not have concerns regarding medicines.  The following changes have been made:  None  Labs/ tests ordered today include: None  Orders Placed This Encounter  Procedures  . EKG 12-Lead     Disposition:   FU with me in in 12 months.    Signed, Minus Breeding, MD  11/26/2019 9:24 AM    [facility]

## 2019-11-26 ENCOUNTER — Other Ambulatory Visit: Payer: Self-pay

## 2019-11-26 ENCOUNTER — Ambulatory Visit (INDEPENDENT_AMBULATORY_CARE_PROVIDER_SITE_OTHER): Payer: Medicare Other | Admitting: Cardiology

## 2019-11-26 ENCOUNTER — Encounter: Payer: Self-pay | Admitting: Cardiology

## 2019-11-26 VITALS — BP 108/60 | HR 55 | Ht 70.0 in | Wt 166.6 lb

## 2019-11-26 DIAGNOSIS — I2510 Atherosclerotic heart disease of native coronary artery without angina pectoris: Secondary | ICD-10-CM

## 2019-11-26 DIAGNOSIS — Z7189 Other specified counseling: Secondary | ICD-10-CM | POA: Diagnosis not present

## 2019-11-26 DIAGNOSIS — E785 Hyperlipidemia, unspecified: Secondary | ICD-10-CM

## 2019-11-26 DIAGNOSIS — I10 Essential (primary) hypertension: Secondary | ICD-10-CM | POA: Diagnosis not present

## 2019-11-26 NOTE — Patient Instructions (Signed)
Medication Instructions:  No changes *If you need a refill on your cardiac medications before your next appointment, please call your pharmacy*  Lab Work: No changes  Testing/Procedures: No changes  Follow-Up: At [facility], you and your health needs are our priority.  As part of our continuing mission to provide you with exceptional heart care, we have created designated Provider Care Teams.  These Care Teams include your primary Cardiologist (physician) and Advanced Practice Providers (APPs -  Physician Assistants and Nurse Practitioners) who all work together to provide you with the care you need, when you need it.   Your next appointment:   12 month(s)  You will receive a reminder letter in the mail two months in advance. If you don't receive a letter, please call our office to schedule the follow-up appointment.  The format for your next appointment:   In Person  Provider:   Minus Breeding, MD

## 2019-12-11 DIAGNOSIS — I10 Essential (primary) hypertension: Secondary | ICD-10-CM | POA: Diagnosis not present

## 2019-12-11 DIAGNOSIS — I4891 Unspecified atrial fibrillation: Secondary | ICD-10-CM | POA: Diagnosis not present

## 2019-12-11 DIAGNOSIS — I25810 Atherosclerosis of coronary artery bypass graft(s) without angina pectoris: Secondary | ICD-10-CM | POA: Diagnosis not present

## 2019-12-11 DIAGNOSIS — E785 Hyperlipidemia, unspecified: Secondary | ICD-10-CM | POA: Diagnosis not present

## 2019-12-11 DIAGNOSIS — J40 Bronchitis, not specified as acute or chronic: Secondary | ICD-10-CM | POA: Diagnosis not present

## 2019-12-16 ENCOUNTER — Other Ambulatory Visit: Payer: Self-pay | Admitting: Cardiology

## 2020-01-08 DIAGNOSIS — Z23 Encounter for immunization: Secondary | ICD-10-CM | POA: Diagnosis not present

## 2020-02-01 DIAGNOSIS — I10 Essential (primary) hypertension: Secondary | ICD-10-CM | POA: Diagnosis not present

## 2020-02-01 DIAGNOSIS — J40 Bronchitis, not specified as acute or chronic: Secondary | ICD-10-CM | POA: Diagnosis not present

## 2020-02-01 DIAGNOSIS — E785 Hyperlipidemia, unspecified: Secondary | ICD-10-CM | POA: Diagnosis not present

## 2020-02-01 DIAGNOSIS — I4891 Unspecified atrial fibrillation: Secondary | ICD-10-CM | POA: Diagnosis not present

## 2020-02-01 DIAGNOSIS — I25810 Atherosclerosis of coronary artery bypass graft(s) without angina pectoris: Secondary | ICD-10-CM | POA: Diagnosis not present

## 2020-02-06 DIAGNOSIS — Z23 Encounter for immunization: Secondary | ICD-10-CM | POA: Diagnosis not present

## 2020-03-08 ENCOUNTER — Other Ambulatory Visit: Payer: Self-pay | Admitting: Cardiology

## 2020-04-14 ENCOUNTER — Ambulatory Visit (INDEPENDENT_AMBULATORY_CARE_PROVIDER_SITE_OTHER): Payer: Medicare Other | Admitting: Cardiology

## 2020-04-14 ENCOUNTER — Other Ambulatory Visit: Payer: Self-pay

## 2020-04-14 ENCOUNTER — Encounter: Payer: Self-pay | Admitting: Cardiology

## 2020-04-14 VITALS — BP 170/70 | HR 56 | Ht 70.0 in | Wt 167.8 lb

## 2020-04-14 DIAGNOSIS — R079 Chest pain, unspecified: Secondary | ICD-10-CM | POA: Diagnosis not present

## 2020-04-14 NOTE — Progress Notes (Signed)
Cardiology Office Note   Date:  04/14/2020   ID:  Patrick Nguyen, DOB December 10, 1940, MRN [id number]  PCP:  Marden Noble, MD  Cardiologist:   Rollene Rotunda, MD    No chief complaint on file.     History of Present Illness: Patrick Nguyen is a 80 y.o. male  who presents for follow up of CAD with LIMA to LAD,SVG to OM, SVG to PDA 2019.    He called today and was added to my schedule.  On Thursday last week he was golfing.  He did fine with that.  However, the next day he has soreness in his left chest under his axilla.  This resolved but he then had dizziness and lightheadedness.  He was more short of breath walking up the stairs.  He had some balance issues.  He felt his heart beating strong but it wasn't fast or irregular.  He did not describe orthostatic symptoms but was just kind of off balance.  He denied any chest pressure, neck or arm discomfort.  He had no PND or orthopnea.  The symptoms were not like those that he had prior to his bypass.  He said this persisted for about 3 or 4 days and actually today he feels much better.  He does report that he has been having some sinus drainage and difficulties with some hoarseness.  He was exposed to Covid but more than a week ago.  He has not had any fevers or chills.  He has not had any cough.  Past Medical History:  Diagnosis Date  . Anginal pain ([facility]) 05/13/2017  . Arthritis   . CAD (coronary artery disease) 05/13/2017   s/p CABG w/ / LIMA-LAD, SVG-OM, SVG-PDA  . Dyspnea    w/ exertion   . Elevated coronary artery calcium score   . GERD (gastroesophageal reflux disease)    hx  . Hepatitis    in   high  school    . Hyperlipidemia   . Hypertension   . Myocardial infarction ([facility])   . Rosacea     Past Surgical History:  Procedure Laterality Date  . CARDIAC CATHETERIZATION    . CORONARY ARTERY BYPASS GRAFT N/A 06/07/2017   Procedure: CORONARY ARTERY BYPASS GRAFTING (CABG) x three, using left internal mammary artery and right leg  greater saphenous vein harvested endoscopically;  Surgeon: Kerin Perna, MD;  Location: [facility];  Service: Open Heart Surgery;  Laterality: N/A;  . INTRAVASCULAR PRESSURE WIRE/FFR STUDY N/A 05/19/2017   Procedure: INTRAVASCULAR PRESSURE WIRE/FFR STUDY;  Surgeon: Lyn Records, MD;  Location: [facility];  Service: Cardiovascular;  Laterality: N/A;  . LEFT HEART CATH AND CORONARY ANGIOGRAPHY N/A 05/19/2017   Procedure: LEFT HEART CATH AND CORONARY ANGIOGRAPHY;  Surgeon: Lyn Records, MD;  Location: [facility];  Service: Cardiovascular;  Laterality: N/A;  . ROTATOR CUFF REPAIR Left   . TEE WITHOUT CARDIOVERSION N/A 06/07/2017   Procedure: TRANSESOPHAGEAL ECHOCARDIOGRAM (TEE);  Surgeon: Donata Clay, Theron Arista, MD;  Location: [facility] OR;  Service: Open Heart Surgery;  Laterality: N/A;     Current Outpatient Medications  Medication Sig Dispense Refill  . aspirin EC 81 MG tablet Take 81 mg by mouth daily.    . metoprolol tartrate (LOPRESSOR) 25 MG tablet TAKE 1/2 TABLET BY MOUTH TWICE A DAY 90 tablet 3  . nitroGLYCERIN (NITROSTAT) 0.4 MG SL tablet Place 1 tablet (0.4 mg total) under the tongue every 5 (five) minutes as needed for chest  pain. 25 tablet 3  . olmesartan (BENICAR) 5 MG tablet TAKE 1/2 TABLET BY MOUTH DAILY 45 tablet 11  . rosuvastatin (CRESTOR) 20 MG tablet Take 1 tablet (20 mg total) by mouth every evening. 30 tablet 2   No current facility-administered medications for this visit.    Allergies:   Atorvastatin    ROS:  Please see the history of present illness.   Otherwise, review of systems are positive for sinus drainage.   All other systems are reviewed and negative.    PHYSICAL EXAM: VS:  Pulse (!) 56   Ht 5\' 10"  (1.778 m)   Wt 167 lb 12.8 oz (76.1 kg)   BMI 24.08 kg/m  , BMI Body mass index is 24.08 kg/m. GENERAL:  Well appearing NECK:  No jugular venous distention, waveform within normal limits, carotid upstroke brisk and symmetric, no bruits, no  thyromegaly LUNGS:  Clear to auscultation bilaterally CHEST:  Well healed sternotomy scar. HEART:  PMI not displaced or sustained,S1 and S2 within normal limits, no S3, no S4, no clicks, no rubs, no murmurs ABD:  Flat, positive bowel sounds normal in frequency in pitch, no bruits, no rebound, no guarding, no midline pulsatile mass, no hepatomegaly, no splenomegaly EXT:  2 plus pulses throughout, no edema, no cyanosis no clubbing  EKG:  EKG  ordered today. Sinus bradycardia, rate 56, axis within normal limits, intervals within normal limits, no acute ST-T wave changes.  Right bundle branch block which has progressed from incomplete right bundle branch block of the left appointment.   Recent Labs: No results found for requested labs within last 8760 hours.     Wt Readings from Last 3 Encounters:  04/14/20 167 lb 12.8 oz (76.1 kg)  11/26/19 166 lb 9.6 oz (75.6 kg)  11/20/18 164 lb (74.4 kg)      Other studies Reviewed: Additional studies/ records that were reviewed today include: Labs. Review of the above records demonstrates:  Please see elsewhere in the note.     ASSESSMENT AND PLAN:  DIZZINESS: He was added to my schedule for this.  He is not orthostatic in the office.  At this point I don't see a clear cardiac etiology to this.  He does have bradycardia and conduction disturbance as described and I had a conversation with him about the fact that this could be bradycardia arrhythmia related.  He will get a pulse oximeter.  He can keep an eye on his heart rate and see if we can correlate with symptoms.  He does have an appointment to check on his cough and congestion which could be related to his dizziness.  CAD/CABG:   The patient has no new sypmtoms.  No further cardiovascular testing is indicated.  We will continue with aggressive risk reduction and meds as listed.  DYSLIPIDEMIA:LDL is 67.  Continue current therapy.   HTN: The blood pressure is at target.  No change in  therapy   COVID EDUCATION:   The patient has been vaccinated.  Current medicines are reviewed at length with the patient today.  The patient does not have concerns regarding medicines.  The following changes have been made:  None  Labs/ tests ordered today include: None  Orders Placed This Encounter  Procedures  . EKG 12-Lead     Disposition:   FU with me in in August  Signed, Minus Breeding, MD  04/14/2020 12:30 PM    [place]

## 2020-04-14 NOTE — Patient Instructions (Signed)
Medication Instructions:  No changes *If you need a refill on your cardiac medications before your next appointment, please call your pharmacy*  Lab Work: None ordered this visit  Testing/Procedures: None ordered this visit  Follow-Up: At [facility], you and your health needs are our priority.  As part of our continuing mission to provide you with exceptional heart care, we have created designated Provider Care Teams.  These Care Teams include your primary Cardiologist (physician) and Advanced Practice Providers (APPs -  Physician Assistants and Nurse Practitioners) who all work together to provide you with the care you need, when you need it.  Your next appointment:   In August  The format for your next appointment:   In Person  Provider:   Rollene Rotunda, MD

## 2020-04-29 DIAGNOSIS — J342 Deviated nasal septum: Secondary | ICD-10-CM | POA: Diagnosis not present

## 2020-04-29 DIAGNOSIS — J301 Allergic rhinitis due to pollen: Secondary | ICD-10-CM | POA: Diagnosis not present

## 2020-04-29 DIAGNOSIS — J3081 Allergic rhinitis due to animal (cat) (dog) hair and dander: Secondary | ICD-10-CM | POA: Diagnosis not present

## 2020-04-29 DIAGNOSIS — J3089 Other allergic rhinitis: Secondary | ICD-10-CM | POA: Diagnosis not present

## 2020-05-13 DIAGNOSIS — Z0000 Encounter for general adult medical examination without abnormal findings: Secondary | ICD-10-CM | POA: Diagnosis not present

## 2020-05-13 DIAGNOSIS — R55 Syncope and collapse: Secondary | ICD-10-CM | POA: Diagnosis not present

## 2020-05-13 DIAGNOSIS — E538 Deficiency of other specified B group vitamins: Secondary | ICD-10-CM | POA: Diagnosis not present

## 2020-05-13 DIAGNOSIS — E785 Hyperlipidemia, unspecified: Secondary | ICD-10-CM | POA: Diagnosis not present

## 2020-05-13 DIAGNOSIS — Z1389 Encounter for screening for other disorder: Secondary | ICD-10-CM | POA: Diagnosis not present

## 2020-05-13 DIAGNOSIS — Z79899 Other long term (current) drug therapy: Secondary | ICD-10-CM | POA: Diagnosis not present

## 2020-05-13 DIAGNOSIS — E559 Vitamin D deficiency, unspecified: Secondary | ICD-10-CM | POA: Diagnosis not present

## 2020-05-13 DIAGNOSIS — I10 Essential (primary) hypertension: Secondary | ICD-10-CM | POA: Diagnosis not present

## 2020-05-13 DIAGNOSIS — K52832 Lymphocytic colitis: Secondary | ICD-10-CM | POA: Diagnosis not present

## 2020-05-13 DIAGNOSIS — I25810 Atherosclerosis of coronary artery bypass graft(s) without angina pectoris: Secondary | ICD-10-CM | POA: Diagnosis not present

## 2020-05-20 DIAGNOSIS — I25810 Atherosclerosis of coronary artery bypass graft(s) without angina pectoris: Secondary | ICD-10-CM | POA: Diagnosis not present

## 2020-05-20 DIAGNOSIS — J40 Bronchitis, not specified as acute or chronic: Secondary | ICD-10-CM | POA: Diagnosis not present

## 2020-05-20 DIAGNOSIS — I4891 Unspecified atrial fibrillation: Secondary | ICD-10-CM | POA: Diagnosis not present

## 2020-05-20 DIAGNOSIS — I10 Essential (primary) hypertension: Secondary | ICD-10-CM | POA: Diagnosis not present

## 2020-05-20 DIAGNOSIS — E785 Hyperlipidemia, unspecified: Secondary | ICD-10-CM | POA: Diagnosis not present

## 2020-06-04 DIAGNOSIS — H52203 Unspecified astigmatism, bilateral: Secondary | ICD-10-CM | POA: Diagnosis not present

## 2020-06-04 DIAGNOSIS — Z961 Presence of intraocular lens: Secondary | ICD-10-CM | POA: Diagnosis not present

## 2020-07-30 DIAGNOSIS — Z23 Encounter for immunization: Secondary | ICD-10-CM | POA: Diagnosis not present

## 2020-08-06 DIAGNOSIS — I25810 Atherosclerosis of coronary artery bypass graft(s) without angina pectoris: Secondary | ICD-10-CM | POA: Diagnosis not present

## 2020-08-06 DIAGNOSIS — E785 Hyperlipidemia, unspecified: Secondary | ICD-10-CM | POA: Diagnosis not present

## 2020-08-06 DIAGNOSIS — J40 Bronchitis, not specified as acute or chronic: Secondary | ICD-10-CM | POA: Diagnosis not present

## 2020-08-06 DIAGNOSIS — I4891 Unspecified atrial fibrillation: Secondary | ICD-10-CM | POA: Diagnosis not present

## 2020-08-06 DIAGNOSIS — I10 Essential (primary) hypertension: Secondary | ICD-10-CM | POA: Diagnosis not present

## 2020-08-18 DIAGNOSIS — M779 Enthesopathy, unspecified: Secondary | ICD-10-CM | POA: Diagnosis not present

## 2020-08-27 DIAGNOSIS — M79641 Pain in right hand: Secondary | ICD-10-CM | POA: Diagnosis not present

## 2020-08-27 DIAGNOSIS — M79645 Pain in left finger(s): Secondary | ICD-10-CM | POA: Diagnosis not present

## 2020-11-24 ENCOUNTER — Ambulatory Visit
Admission: RE | Admit: 2020-11-24 | Discharge: 2020-11-24 | Disposition: A | Discharge status: Home / Self Care | Payer: Medicare Other | Source: Ambulatory Visit | Attending: Physician Assistant | Admitting: Physician Assistant

## 2020-11-24 ENCOUNTER — Other Ambulatory Visit: Payer: Self-pay | Admitting: Physician Assistant

## 2020-11-24 DIAGNOSIS — M25551 Pain in right hip: Secondary | ICD-10-CM | POA: Diagnosis not present

## 2020-11-24 DIAGNOSIS — M160 Bilateral primary osteoarthritis of hip: Secondary | ICD-10-CM | POA: Diagnosis not present

## 2020-11-24 DIAGNOSIS — R52 Pain, unspecified: Secondary | ICD-10-CM

## 2020-11-24 DIAGNOSIS — M79651 Pain in right thigh: Secondary | ICD-10-CM | POA: Diagnosis not present

## 2020-11-24 DIAGNOSIS — R102 Pelvic and perineal pain: Secondary | ICD-10-CM | POA: Diagnosis not present

## 2020-12-02 DIAGNOSIS — M25551 Pain in right hip: Secondary | ICD-10-CM | POA: Diagnosis not present

## 2020-12-02 DIAGNOSIS — R262 Difficulty in walking, not elsewhere classified: Secondary | ICD-10-CM | POA: Diagnosis not present

## 2020-12-02 DIAGNOSIS — M79604 Pain in right leg: Secondary | ICD-10-CM | POA: Diagnosis not present

## 2020-12-05 DIAGNOSIS — M25551 Pain in right hip: Secondary | ICD-10-CM | POA: Diagnosis not present

## 2020-12-05 DIAGNOSIS — R262 Difficulty in walking, not elsewhere classified: Secondary | ICD-10-CM | POA: Diagnosis not present

## 2020-12-05 DIAGNOSIS — M79604 Pain in right leg: Secondary | ICD-10-CM | POA: Diagnosis not present

## 2020-12-08 DIAGNOSIS — J3089 Other allergic rhinitis: Secondary | ICD-10-CM | POA: Diagnosis not present

## 2020-12-08 DIAGNOSIS — J301 Allergic rhinitis due to pollen: Secondary | ICD-10-CM | POA: Diagnosis not present

## 2020-12-08 DIAGNOSIS — J342 Deviated nasal septum: Secondary | ICD-10-CM | POA: Diagnosis not present

## 2020-12-08 DIAGNOSIS — J3081 Allergic rhinitis due to animal (cat) (dog) hair and dander: Secondary | ICD-10-CM | POA: Diagnosis not present

## 2020-12-10 DIAGNOSIS — R262 Difficulty in walking, not elsewhere classified: Secondary | ICD-10-CM | POA: Diagnosis not present

## 2020-12-10 DIAGNOSIS — M25551 Pain in right hip: Secondary | ICD-10-CM | POA: Diagnosis not present

## 2020-12-10 DIAGNOSIS — M79604 Pain in right leg: Secondary | ICD-10-CM | POA: Diagnosis not present

## 2020-12-12 DIAGNOSIS — M25551 Pain in right hip: Secondary | ICD-10-CM | POA: Diagnosis not present

## 2020-12-12 DIAGNOSIS — R262 Difficulty in walking, not elsewhere classified: Secondary | ICD-10-CM | POA: Diagnosis not present

## 2020-12-12 DIAGNOSIS — M79604 Pain in right leg: Secondary | ICD-10-CM | POA: Diagnosis not present

## 2020-12-16 DIAGNOSIS — M25551 Pain in right hip: Secondary | ICD-10-CM | POA: Diagnosis not present

## 2020-12-16 DIAGNOSIS — R262 Difficulty in walking, not elsewhere classified: Secondary | ICD-10-CM | POA: Diagnosis not present

## 2020-12-16 DIAGNOSIS — M79604 Pain in right leg: Secondary | ICD-10-CM | POA: Diagnosis not present

## 2020-12-24 DIAGNOSIS — R262 Difficulty in walking, not elsewhere classified: Secondary | ICD-10-CM | POA: Diagnosis not present

## 2020-12-24 DIAGNOSIS — M79604 Pain in right leg: Secondary | ICD-10-CM | POA: Diagnosis not present

## 2020-12-24 DIAGNOSIS — M25551 Pain in right hip: Secondary | ICD-10-CM | POA: Diagnosis not present

## 2020-12-26 DIAGNOSIS — M25551 Pain in right hip: Secondary | ICD-10-CM | POA: Diagnosis not present

## 2020-12-26 DIAGNOSIS — M79604 Pain in right leg: Secondary | ICD-10-CM | POA: Diagnosis not present

## 2020-12-26 DIAGNOSIS — R262 Difficulty in walking, not elsewhere classified: Secondary | ICD-10-CM | POA: Diagnosis not present

## 2020-12-29 DIAGNOSIS — M25551 Pain in right hip: Secondary | ICD-10-CM | POA: Diagnosis not present

## 2020-12-29 DIAGNOSIS — R262 Difficulty in walking, not elsewhere classified: Secondary | ICD-10-CM | POA: Diagnosis not present

## 2020-12-29 DIAGNOSIS — M79604 Pain in right leg: Secondary | ICD-10-CM | POA: Diagnosis not present

## 2021-01-01 DIAGNOSIS — M25551 Pain in right hip: Secondary | ICD-10-CM | POA: Diagnosis not present

## 2021-01-01 DIAGNOSIS — R262 Difficulty in walking, not elsewhere classified: Secondary | ICD-10-CM | POA: Diagnosis not present

## 2021-01-01 DIAGNOSIS — M79604 Pain in right leg: Secondary | ICD-10-CM | POA: Diagnosis not present

## 2021-01-06 DIAGNOSIS — M5431 Sciatica, right side: Secondary | ICD-10-CM | POA: Diagnosis not present

## 2021-02-02 DIAGNOSIS — Z23 Encounter for immunization: Secondary | ICD-10-CM | POA: Diagnosis not present

## 2021-02-06 DIAGNOSIS — M5431 Sciatica, right side: Secondary | ICD-10-CM | POA: Diagnosis not present

## 2021-02-12 ENCOUNTER — Other Ambulatory Visit: Payer: Self-pay | Admitting: Internal Medicine

## 2021-02-12 DIAGNOSIS — M5431 Sciatica, right side: Secondary | ICD-10-CM

## 2021-02-27 DIAGNOSIS — Z23 Encounter for immunization: Secondary | ICD-10-CM | POA: Diagnosis not present

## 2021-03-02 ENCOUNTER — Other Ambulatory Visit: Payer: Self-pay | Admitting: Cardiology

## 2021-03-03 ENCOUNTER — Ambulatory Visit
Admission: RE | Admit: 2021-03-03 | Discharge: 2021-03-03 | Disposition: A | Discharge status: Home / Self Care | Payer: Medicare Other | Source: Ambulatory Visit | Attending: Internal Medicine | Admitting: Internal Medicine

## 2021-03-03 ENCOUNTER — Other Ambulatory Visit: Payer: Self-pay

## 2021-03-03 DIAGNOSIS — M5431 Sciatica, right side: Secondary | ICD-10-CM

## 2021-03-03 DIAGNOSIS — M545 Low back pain, unspecified: Secondary | ICD-10-CM | POA: Diagnosis not present

## 2021-03-03 DIAGNOSIS — R200 Anesthesia of skin: Secondary | ICD-10-CM | POA: Diagnosis not present

## 2021-03-12 DIAGNOSIS — M5416 Radiculopathy, lumbar region: Secondary | ICD-10-CM | POA: Diagnosis not present

## 2021-03-12 DIAGNOSIS — M5431 Sciatica, right side: Secondary | ICD-10-CM | POA: Diagnosis not present

## 2021-03-13 DIAGNOSIS — I10 Essential (primary) hypertension: Secondary | ICD-10-CM | POA: Diagnosis not present

## 2021-03-13 DIAGNOSIS — I700 Atherosclerosis of aorta: Secondary | ICD-10-CM | POA: Diagnosis not present

## 2021-03-13 DIAGNOSIS — M4726 Other spondylosis with radiculopathy, lumbar region: Secondary | ICD-10-CM | POA: Diagnosis not present

## 2021-03-13 DIAGNOSIS — M5416 Radiculopathy, lumbar region: Secondary | ICD-10-CM | POA: Diagnosis not present

## 2021-03-14 ENCOUNTER — Other Ambulatory Visit: Payer: Self-pay | Admitting: Cardiology

## 2021-03-16 ENCOUNTER — Telehealth: Payer: Self-pay | Admitting: *Deleted

## 2021-03-16 NOTE — Telephone Encounter (Signed)
   Pre-operative Risk Assessment    Patient Name: Patrick Nguyen  DOB: Dec 14, 1940 MRN: [id number]      Request for Surgical Clearance   Procedure:   LUMBAR MICRODISCECTOMY  Date of Surgery: Clearance TBD                                 Surgeon:  DR. Duffy Rhody Surgeon's Group or Practice Name:  [facility] Phone number:  [phone] Fax number:  [phone] ATTN: Lexine Baton   Type of Clearance Requested: - Medical  - Pharmacy:  Hold Aspirin     Type of Anesthesia:   General    Additional requests/questions:   Jiles Prows   03/16/2021, 2:01 PM

## 2021-03-17 NOTE — Telephone Encounter (Signed)
Dr. Percival Spanish Pt has history of CABG in 2019. We are asked for clearance for back surgery and for ASA hold.   He can complete more than 4.0 METS without angina (golfing, walking, groceries). Cleared medically.   Will await Dr. Rosezella Florida guidance for ASA hold.

## 2021-03-18 NOTE — Telephone Encounter (Signed)
   Name: Patrick Nguyen  DOB: November 01, 1940  MRN: [id number]   Primary Cardiologist: Minus Breeding, MD  Chart reviewed as part of pre-operative protocol coverage. Patient was contacted 03/18/2021 in reference to pre-operative risk assessment for pending surgery as outlined below.  Patrick Nguyen was last seen on 04/14/20 by Dr. Percival Spanish.  Since that day, Patrick Nguyen has done well. He has a history of CABG in 2019. He is able to complete 4.0 METS without angina.   Per Dr. Percival Spanish:   Ok to hold ASA if the surgery is not possible on the ASA.     Therefore, based on ACC/AHA guidelines, the patient would be at acceptable risk for the planned procedure without further cardiovascular testing.   The patient was advised that if he develops new symptoms prior to surgery to contact our office to arrange for a follow-up visit, and he verbalized understanding.  I will route this recommendation to the requesting party via Epic fax function and remove from pre-op pool. Please call with questions.  [place], PA 03/18/2021, 10:07 AM

## 2021-04-15 DIAGNOSIS — Z01810 Encounter for preprocedural cardiovascular examination: Secondary | ICD-10-CM | POA: Diagnosis not present

## 2021-04-15 DIAGNOSIS — Z01812 Encounter for preprocedural laboratory examination: Secondary | ICD-10-CM | POA: Diagnosis not present

## 2021-04-15 DIAGNOSIS — I4510 Unspecified right bundle-branch block: Secondary | ICD-10-CM | POA: Diagnosis not present

## 2021-04-15 DIAGNOSIS — M5416 Radiculopathy, lumbar region: Secondary | ICD-10-CM | POA: Diagnosis not present

## 2021-04-15 DIAGNOSIS — R001 Bradycardia, unspecified: Secondary | ICD-10-CM | POA: Diagnosis not present

## 2021-04-16 DIAGNOSIS — I2510 Atherosclerotic heart disease of native coronary artery without angina pectoris: Secondary | ICD-10-CM | POA: Diagnosis not present

## 2021-04-16 DIAGNOSIS — I252 Old myocardial infarction: Secondary | ICD-10-CM | POA: Diagnosis not present

## 2021-04-16 DIAGNOSIS — M5116 Intervertebral disc disorders with radiculopathy, lumbar region: Secondary | ICD-10-CM | POA: Diagnosis not present

## 2021-04-16 DIAGNOSIS — M48061 Spinal stenosis, lumbar region without neurogenic claudication: Secondary | ICD-10-CM | POA: Diagnosis not present

## 2021-04-16 DIAGNOSIS — M5416 Radiculopathy, lumbar region: Secondary | ICD-10-CM | POA: Diagnosis not present

## 2021-04-16 DIAGNOSIS — Z951 Presence of aortocoronary bypass graft: Secondary | ICD-10-CM | POA: Diagnosis not present

## 2021-05-06 DIAGNOSIS — R42 Dizziness and giddiness: Secondary | ICD-10-CM | POA: Insufficient documentation

## 2021-05-06 NOTE — Progress Notes (Signed)
Cardiology Office Note   Date:  05/07/2021   ID:  Patrick Nguyen, DOB 08-24-1940, MRN [id number]  PCP:  [name], MD  Cardiologist:   Minus Breeding, MD    Chief Complaint  Patient presents with   Coronary Artery Disease      History of Present Illness: Patrick Nguyen is a 81 y.o. male  who presents for follow up of CAD with LIMA to LAD,SVG to OM, SVG to PDA 2019. At the last appt he had dizziness without a clear etiology.  Since I last saw him he had to have lumbar back surgery.  However, from a cardiac standpoint he has had no problems.  He denies palpitations, presyncope or syncope.  Has had no chest pressure, neck or arm discomfort.  He has had no weight gain or edema.  Unfortunately has not been able to exercise as much because of this.   Past Medical History:  Diagnosis Date   Arthritis    CAD (coronary artery disease) 05/13/2017   s/p CABG w/ / LIMA-LAD, SVG-OM, SVG-PDA   Dyspnea    w/ exertion    Elevated coronary artery calcium score    GERD (gastroesophageal reflux disease)    hx   Hepatitis    in   high  school     Hyperlipidemia    Hypertension    Rosacea     Past Surgical History:  Procedure Laterality Date   BACK SURGERY N/A    CARDIAC CATHETERIZATION     CORONARY ARTERY BYPASS GRAFT N/A 06/07/2017   Procedure: CORONARY ARTERY BYPASS GRAFTING (CABG) x three, using left internal mammary artery and right leg greater saphenous vein harvested endoscopically;  Surgeon: Ivin Poot, MD;  Location: [place];  Service: Open Heart Surgery;  Laterality: N/A;   INTRAVASCULAR PRESSURE WIRE/FFR STUDY N/A 05/19/2017   Procedure: INTRAVASCULAR PRESSURE WIRE/FFR STUDY;  Surgeon: [name], MD;  Location: [place] [facility];  Service: Cardiovascular;  Laterality: N/A;   LEFT HEART CATH AND CORONARY ANGIOGRAPHY N/A 05/19/2017   Procedure: LEFT HEART CATH AND CORONARY ANGIOGRAPHY;  Surgeon: [name], MD;  Location: [place] [facility];  Service:  Cardiovascular;  Laterality: N/A;   ROTATOR CUFF REPAIR Left    TEE WITHOUT CARDIOVERSION N/A 06/07/2017   Procedure: TRANSESOPHAGEAL ECHOCARDIOGRAM (TEE);  Surgeon: Prescott Gum, Collier Salina, MD;  Location: [place];  Service: Open Heart Surgery;  Laterality: N/A;     Current Outpatient Medications  Medication Sig Dispense Refill   aspirin EC 81 MG tablet Take 81 mg by mouth daily.     metoprolol tartrate (LOPRESSOR) 25 MG tablet TAKE 1/2 TABLET BY MOUTH TWICE A DAY 90 tablet 3   olmesartan (BENICAR) 5 MG tablet TAKE 1/2 TABLET BY MOUTH DAILY 45 tablet 3   rosuvastatin (CRESTOR) 20 MG tablet Take 1 tablet (20 mg total) by mouth every evening. 30 tablet 2   nitroGLYCERIN (NITROSTAT) 0.4 MG SL tablet Place 1 tablet (0.4 mg total) under the tongue every 5 (five) minutes as needed for chest pain. (Patient not taking: Reported on 05/07/2021) 25 tablet 3   No current facility-administered medications for this visit.    Allergies:   Atorvastatin    ROS:  Please see the history of present illness.   Otherwise, review of systems are positive for none.   All other systems are reviewed and negative.    PHYSICAL EXAM: VS:  BP 128/66 (BP Location: Left Arm, Patient Position: Sitting, Cuff Size: Normal)  Pulse (!) 51    Ht 5\' 10"  (1.778 m)    Wt 168 lb 12.8 oz (76.6 kg)    SpO2 95%    BMI 24.22 kg/m  , BMI Body mass index is 24.22 kg/m. GENERAL:  Well appearing NECK:  No jugular venous distention, waveform within normal limits, carotid upstroke brisk and symmetric, no bruits, no thyromegaly LUNGS:  Clear to auscultation bilaterally CHEST:  Well healed sternotomy scar. HEART:  PMI not displaced or sustained,S1 and S2 within normal limits, no S3, no S4, no clicks, no rubs, no murmurs ABD:  Flat, positive bowel sounds normal in frequency in pitch, no bruits, no rebound, no guarding, no midline pulsatile mass, no hepatomegaly, no splenomegaly EXT:  2 plus pulses throughout, no edema, no cyanosis no  clubbing   EKG:  EKG   ordered today. Sinus bradycardia, rate 51, axis within normal limits, intervals within normal limits, no acute ST-T wave changes.  Right bundle branch block    Recent Labs: No results found for requested labs within last 8760 hours.     Wt Readings from Last 3 Encounters:  05/07/21 168 lb 12.8 oz (76.6 kg)  04/14/20 167 lb 12.8 oz (76.1 kg)  11/26/19 166 lb 9.6 oz (75.6 kg)      Other studies Reviewed: Additional studies/ records that were reviewed today include: Labs. Review of the above records demonstrates:  Please see elsewhere in the note.     ASSESSMENT AND PLAN:      CAD/CABG:   He has had no chest discomfort.  No further testing is indicated.  We will continue with risk reduction.  DYSLIPIDEMIA:    LDL is 65 but this was a couple of years ago now.  I was able to see most of his labs but I could not see a lipid profile.  I will defer to [name], MD with a goal LDL closer to 50 these days.   HTN:  The blood pressure is controlled.  No change in therapy.    Current medicines are reviewed at length with the patient today.  The patient does not have concerns regarding medicines.  The following changes have been made:  None  Labs/ tests ordered today include:  None  Orders Placed This Encounter  Procedures   EKG 12-Lead     Disposition:   FU with me in one year.   Signed, Minus Breeding, MD  05/07/2021 12:25 PM    [facility]

## 2021-05-07 ENCOUNTER — Ambulatory Visit (INDEPENDENT_AMBULATORY_CARE_PROVIDER_SITE_OTHER): Payer: Medicare Other | Admitting: Cardiology

## 2021-05-07 ENCOUNTER — Encounter: Payer: Self-pay | Admitting: Cardiology

## 2021-05-07 ENCOUNTER — Other Ambulatory Visit: Payer: Self-pay

## 2021-05-07 VITALS — BP 128/66 | HR 51 | Ht 70.0 in | Wt 168.8 lb

## 2021-05-07 DIAGNOSIS — R42 Dizziness and giddiness: Secondary | ICD-10-CM

## 2021-05-07 DIAGNOSIS — I10 Essential (primary) hypertension: Secondary | ICD-10-CM | POA: Diagnosis not present

## 2021-05-07 DIAGNOSIS — E785 Hyperlipidemia, unspecified: Secondary | ICD-10-CM | POA: Diagnosis not present

## 2021-05-07 DIAGNOSIS — I2510 Atherosclerotic heart disease of native coronary artery without angina pectoris: Secondary | ICD-10-CM

## 2021-05-07 NOTE — Patient Instructions (Signed)
Medication Instructions:  Your Physician recommend you continue on your current medication as directed.    *If you need a refill on your cardiac medications before your next appointment, please call your pharmacy*   Follow-Up: At [facility], you and your health needs are our priority.  As part of our continuing mission to provide you with exceptional heart care, we have created designated Provider Care Teams.  These Care Teams include your primary Cardiologist (physician) and Advanced Practice Providers (APPs -  Physician Assistants and Nurse Practitioners) who all work together to provide you with the care you need, when you need it.  We recommend signing up for the patient portal called "MyChart".  Sign up information is provided on this After Visit Summary.  MyChart is used to connect with patients for Virtual Visits (Telemedicine).  Patients are able to view lab/test results, encounter notes, upcoming appointments, etc.  Non-urgent messages can be sent to your provider as well.   To learn more about what you can do with MyChart, go to [email or web address].    Your next appointment:   1 year(s)  The format for your next appointment:   In Person  Provider:   Minus Breeding, MD

## 2021-05-19 DIAGNOSIS — Z1389 Encounter for screening for other disorder: Secondary | ICD-10-CM | POA: Diagnosis not present

## 2021-05-19 DIAGNOSIS — T7840XA Allergy, unspecified, initial encounter: Secondary | ICD-10-CM | POA: Diagnosis not present

## 2021-05-19 DIAGNOSIS — I25810 Atherosclerosis of coronary artery bypass graft(s) without angina pectoris: Secondary | ICD-10-CM | POA: Diagnosis not present

## 2021-05-19 DIAGNOSIS — K52832 Lymphocytic colitis: Secondary | ICD-10-CM | POA: Diagnosis not present

## 2021-05-19 DIAGNOSIS — I10 Essential (primary) hypertension: Secondary | ICD-10-CM | POA: Diagnosis not present

## 2021-05-19 DIAGNOSIS — Z0000 Encounter for general adult medical examination without abnormal findings: Secondary | ICD-10-CM | POA: Diagnosis not present

## 2021-05-19 DIAGNOSIS — Z79899 Other long term (current) drug therapy: Secondary | ICD-10-CM | POA: Diagnosis not present

## 2021-05-19 DIAGNOSIS — R55 Syncope and collapse: Secondary | ICD-10-CM | POA: Diagnosis not present

## 2021-05-19 DIAGNOSIS — E785 Hyperlipidemia, unspecified: Secondary | ICD-10-CM | POA: Diagnosis not present

## 2021-05-19 DIAGNOSIS — E559 Vitamin D deficiency, unspecified: Secondary | ICD-10-CM | POA: Diagnosis not present

## 2021-06-09 DIAGNOSIS — Z961 Presence of intraocular lens: Secondary | ICD-10-CM | POA: Diagnosis not present

## 2021-07-09 DIAGNOSIS — L820 Inflamed seborrheic keratosis: Secondary | ICD-10-CM | POA: Diagnosis not present

## 2021-12-08 DIAGNOSIS — J301 Allergic rhinitis due to pollen: Secondary | ICD-10-CM | POA: Diagnosis not present

## 2021-12-08 DIAGNOSIS — J3081 Allergic rhinitis due to animal (cat) (dog) hair and dander: Secondary | ICD-10-CM | POA: Diagnosis not present

## 2021-12-08 DIAGNOSIS — J3089 Other allergic rhinitis: Secondary | ICD-10-CM | POA: Diagnosis not present

## 2021-12-08 DIAGNOSIS — J342 Deviated nasal septum: Secondary | ICD-10-CM | POA: Diagnosis not present

## 2022-01-30 DIAGNOSIS — Z23 Encounter for immunization: Secondary | ICD-10-CM | POA: Diagnosis not present

## 2022-02-22 ENCOUNTER — Other Ambulatory Visit: Payer: Self-pay | Admitting: Cardiology

## 2022-03-07 ENCOUNTER — Other Ambulatory Visit: Payer: Self-pay | Admitting: Cardiology

## 2022-05-05 NOTE — Progress Notes (Signed)
Cardiology Office Note   Date:  05/07/2022   ID:  Patrick Nguyen, DOB 08-03-1940, MRN [id number]  PCP:  [name], MD  Cardiologist:   Minus Breeding, MD    Chief Complaint  Patient presents with   Shortness of Breath      History of Present Illness: Patrick Nguyen is a 82 y.o. male  who presents for follow up of CAD with LIMA to LAD,SVG to OM, SVG to PDA in 2019.  Since I saw him he had COVID x 2.  He has had some hoarseness and shallow breathing since then.  He teaches and plays wind instruments and just cannot "breathe from the diaphragm" anymore.  He has sinus congestion as well.  He has some lightheadedness and balance issues he thinks may be related to this.  He denies any chest pressure, neck or arm discomfort.  He has no weight gain or edema.  Past Medical History:  Diagnosis Date   Arthritis    CAD (coronary artery disease) 05/13/2017   s/p CABG w/ / LIMA-LAD, SVG-OM, SVG-PDA   Dyspnea    w/ exertion    Elevated coronary artery calcium score    GERD (gastroesophageal reflux disease)    hx   Hepatitis    in   high  school     Hyperlipidemia    Hypertension    Rosacea     Past Surgical History:  Procedure Laterality Date   BACK SURGERY N/A    CARDIAC CATHETERIZATION     CORONARY ARTERY BYPASS GRAFT N/A 06/07/2017   Procedure: CORONARY ARTERY BYPASS GRAFTING (CABG) x three, using left internal mammary artery and right leg greater saphenous vein harvested endoscopically;  Surgeon: Ivin Poot, MD;  Location: [facility];  Service: Open Heart Surgery;  Laterality: N/A;   INTRAVASCULAR PRESSURE WIRE/FFR STUDY N/A 05/19/2017   Procedure: INTRAVASCULAR PRESSURE WIRE/FFR STUDY;  Surgeon: [name], MD;  Location: [facility];  Service: Cardiovascular;  Laterality: N/A;   LEFT HEART CATH AND CORONARY ANGIOGRAPHY N/A 05/19/2017   Procedure: LEFT HEART CATH AND CORONARY ANGIOGRAPHY;  Surgeon: [name], MD;  Location: [facility];  Service:  Cardiovascular;  Laterality: N/A;   ROTATOR CUFF REPAIR Left    TEE WITHOUT CARDIOVERSION N/A 06/07/2017   Procedure: TRANSESOPHAGEAL ECHOCARDIOGRAM (TEE);  Surgeon: Prescott Gum, Collier Salina, MD;  Location: [facility];  Service: Open Heart Surgery;  Laterality: N/A;     Current Outpatient Medications  Medication Sig Dispense Refill   aspirin EC 81 MG tablet Take 81 mg by mouth daily.     metoprolol tartrate (LOPRESSOR) 25 MG tablet TAKE 1/2 TABLET BY MOUTH TWICE A DAY 90 tablet 3   nitroGLYCERIN (NITROSTAT) 0.4 MG SL tablet Place 1 tablet (0.4 mg total) under the tongue every 5 (five) minutes as needed for chest pain. 25 tablet 3   olmesartan (BENICAR) 5 MG tablet TAKE 1/2 TABLET BY MOUTH DAILY **PATIENT NEEDS AN APPOINTMENT** 45 tablet 3   rosuvastatin (CRESTOR) 20 MG tablet Take 1 tablet (20 mg total) by mouth every evening. 30 tablet 2   No current facility-administered medications for this visit.    Allergies:   Atorvastatin    ROS:  Please see the history of present illness.   Otherwise, review of systems are positive for none.   All other systems are reviewed and negative.    PHYSICAL EXAM: VS:  BP 128/62 (BP Location: Right Arm, Patient Position: Sitting, Cuff  Size: Normal)   Pulse (!) 53   Ht '5\' 10"'$  (1.778 m)   Wt 169 lb (76.7 kg)   BMI 24.25 kg/m  , BMI Body mass index is 24.25 kg/m. GENERAL:  Well appearing NECK:  No jugular venous distention, waveform within normal limits, carotid upstroke brisk and symmetric, no bruits, no thyromegaly LUNGS:  Clear to auscultation bilaterally CHEST:  Well healed sternotomy scar. HEART:  PMI not displaced or sustained,S1 and S2 within normal limits, no S3, no S4, no clicks, no rubs, no murmurs ABD:  Flat, positive bowel sounds normal in frequency in pitch, no bruits, no rebound, no guarding, no midline pulsatile mass, no hepatomegaly, no splenomegaly EXT:  2 plus pulses throughout, no edema, no cyanosis no clubbing   EKG:  EKG  ordered  today. Sinus bradycardia, rate 53, axis within normal limits, intervals within normal limits, no acute ST-T wave changes.  Right bundle branch block    Recent Labs: No results found for requested labs within last 365 days.     Wt Readings from Last 3 Encounters:  05/07/22 169 lb (76.7 kg)  05/07/21 168 lb 12.8 oz (76.6 kg)  04/14/20 167 lb 12.8 oz (76.1 kg)      Other studies Reviewed: Additional studies/ records that were reviewed today include: Labs. Review of the above records demonstrates:  Please see elsewhere in the note.     ASSESSMENT AND PLAN:      CAD/CABG:   The patient has no new sypmtoms.  No further cardiovascular testing is indicated.  We will continue with aggressive risk reduction and meds as listed.  DYSLIPIDEMIA:    LDL was 62 with an HDL of 74.  No change in therapy.   HTN:  The blood pressure is at target.  Continue the meds as listed.   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  He has no edema   Current medicines are reviewed at length with the patient today.  The patient does not have concerns regarding medicines.  The following changes have been made:  None  Labs/ tests ordered today include:  None  Orders Placed This Encounter  Procedures   Ambulatory referral to Pulmonology   EKG 12-Lead     Disposition:   FU with me in one year.   Signed, Minus Breeding, MD  05/07/2022 10:18 AM    [place]

## 2022-05-07 ENCOUNTER — Encounter: Payer: Self-pay | Admitting: Cardiology

## 2022-05-07 ENCOUNTER — Ambulatory Visit: Payer: Medicare Other | Attending: Cardiology | Admitting: Cardiology

## 2022-05-07 VITALS — BP 128/62 | HR 53 | Ht 70.0 in | Wt 169.0 lb

## 2022-05-07 DIAGNOSIS — I2510 Atherosclerotic heart disease of native coronary artery without angina pectoris: Secondary | ICD-10-CM | POA: Insufficient documentation

## 2022-05-07 DIAGNOSIS — E785 Hyperlipidemia, unspecified: Secondary | ICD-10-CM

## 2022-05-07 DIAGNOSIS — I10 Essential (primary) hypertension: Secondary | ICD-10-CM | POA: Insufficient documentation

## 2022-05-07 DIAGNOSIS — R0602 Shortness of breath: Secondary | ICD-10-CM | POA: Diagnosis not present

## 2022-05-07 NOTE — Patient Instructions (Signed)
Medication Instructions:  Your physician recommends that you continue on your current medications as directed. Please refer to the Current Medication list given to you today.  *If you need a refill on your cardiac medications before your next appointment, please call your pharmacy*   Lab Work: NONE If you have labs (blood work) drawn today and your tests are completely normal, you will receive your results only by: [place] (if you have MyChart) OR A paper copy in the mail If you have any lab test that is abnormal or we need to change your treatment, we will call you to review the results.   Testing/Procedures: NONE   Follow-Up: At [facility], you and your health needs are our priority.  As part of our continuing mission to provide you with exceptional heart care, we have created designated Provider Care Teams.  These Care Teams include your primary Cardiologist (physician) and Advanced Practice Providers (APPs -  Physician Assistants and Nurse Practitioners) who all work together to provide you with the care you need, when you need it.  We recommend signing up for the patient portal called "MyChart".  Sign up information is provided on this After Visit Summary.  MyChart is used to connect with patients for Virtual Visits (Telemedicine).  Patients are able to view lab/test results, encounter notes, upcoming appointments, etc.  Non-urgent messages can be sent to your provider as well.   To learn more about what you can do with MyChart, go to [email or web address].    Your next appointment:   1 year(s)  Provider:   Minus Breeding, MD

## 2022-05-28 ENCOUNTER — Ambulatory Visit (INDEPENDENT_AMBULATORY_CARE_PROVIDER_SITE_OTHER): Payer: Medicare Other | Admitting: Pulmonary Disease

## 2022-05-28 ENCOUNTER — Telehealth: Payer: Self-pay | Admitting: Pulmonary Disease

## 2022-05-28 ENCOUNTER — Encounter: Payer: Self-pay | Admitting: Pulmonary Disease

## 2022-05-28 VITALS — BP 130/78 | HR 53 | Ht 70.0 in | Wt 171.0 lb

## 2022-05-28 DIAGNOSIS — R0602 Shortness of breath: Secondary | ICD-10-CM | POA: Diagnosis not present

## 2022-05-28 MED ORDER — ALBUTEROL SULFATE HFA 108 (90 BASE) MCG/ACT IN AERS: 2.0000 | INHALATION_SPRAY | Freq: Four times a day (QID) | RESPIRATORY_TRACT | 3 refills | Status: DC | PRN

## 2022-05-28 MED ORDER — IPRATROPIUM BROMIDE 0.03 % NA SOLN: 2.0000 | Freq: Two times a day (BID) | NASAL | 3 refills | Status: DC

## 2022-05-28 MED ORDER — TRELEGY ELLIPTA 100-62.5-25 MCG/ACT IN AEPB: 1.0000 | INHALATION_SPRAY | Freq: Every day | RESPIRATORY_TRACT | 3 refills | Status: DC

## 2022-05-28 NOTE — Telephone Encounter (Signed)
Patient would like the nurse to call regarding some medication that was just prescribed by the doctor.  Please call to discuss at [phone]

## 2022-05-28 NOTE — Patient Instructions (Signed)
Prescription for Trelegy -To be used daily -Ensure you rinse after use  Albuterol -Rescue inhaler to be used just as needed  Atrovent nasal -To help with nasal congestion and stuffiness  Breathing study on the day of next visit  Will see you late April  Call us with significant concerns   Breathing study as we reviewed today did reveal significant obstructive disease with good response to inhalers during the testing

## 2022-05-28 NOTE — Progress Notes (Signed)
Patrick Nguyen    KF:[id number]    [birth year]  Primary Care Physician:Gates, Herbie [place], MD  Referring Physician: [name], MD [address] [place],  [place] [address]  Chief complaint:   Patient with shortness of breath  HPI:  Shortness of breath, many years  Usually tries to stay active, on a treadmill about 20 minutes of walking  Gets short of breath with activity No cough  Does have nasal stuffiness and congestion Sensitive to grass, cats, does have a cat  Quit smoking in the 1990, remembers having some shortness of breath at the time as well  History of coronary artery disease, status post CABG in 2019  He had had a PFT at that time showing obstructive disease with significant bronchodilator response  Has not been on any inhalers  He does use Flonase regularly for nasal stuffiness and congestion  Does have a history of hypercholesterolemia, history of hypertension, history of heart failure  Music professor, retired  Outpatient Encounter Medications as of 05/28/2022  Medication Sig   aspirin EC 81 MG tablet Take 81 mg by mouth daily.   metoprolol tartrate (LOPRESSOR) 25 MG tablet TAKE 1/2 TABLET BY MOUTH TWICE A DAY   olmesartan (BENICAR) 5 MG tablet TAKE 1/2 TABLET BY MOUTH DAILY **PATIENT NEEDS AN APPOINTMENT**   rosuvastatin (CRESTOR) 20 MG tablet Take 1 tablet (20 mg total) by mouth every evening.   nitroGLYCERIN (NITROSTAT) 0.4 MG SL tablet Place 1 tablet (0.4 mg total) under the tongue every 5 (five) minutes as needed for chest pain.   No facility-administered encounter medications on file as of 05/28/2022.    Allergies as of 05/28/2022 - Review Complete 05/28/2022  Allergen Reaction Noted   Atorvastatin Other (See Comments) 05/13/2017    Past Medical History:  Diagnosis Date   Arthritis    CAD (coronary artery disease) 05/13/2017   s/p CABG w/ / LIMA-LAD, SVG-OM, SVG-PDA   Dyspnea    w/ exertion    Elevated coronary artery  calcium score    GERD (gastroesophageal reflux disease)    hx   Hepatitis    in   high  school     Hyperlipidemia    Hypertension    Rosacea     Past Surgical History:  Procedure Laterality Date   BACK SURGERY N/A    CARDIAC CATHETERIZATION     CORONARY ARTERY BYPASS GRAFT N/A 06/07/2017   Procedure: CORONARY ARTERY BYPASS GRAFTING (CABG) x three, using left internal mammary artery and right leg greater saphenous vein harvested endoscopically;  Surgeon: Ivin Poot, MD;  Location: [facility];  Service: Open Heart Surgery;  Laterality: N/A;   INTRAVASCULAR PRESSURE WIRE/FFR STUDY N/A 05/19/2017   Procedure: INTRAVASCULAR PRESSURE WIRE/FFR STUDY;  Surgeon: [name], MD;  Location: [facility];  Service: Cardiovascular;  Laterality: N/A;   LEFT HEART CATH AND CORONARY ANGIOGRAPHY N/A 05/19/2017   Procedure: LEFT HEART CATH AND CORONARY ANGIOGRAPHY;  Surgeon: [name], MD;  Location: [facility];  Service: Cardiovascular;  Laterality: N/A;   ROTATOR CUFF REPAIR Left    TEE WITHOUT CARDIOVERSION N/A 06/07/2017   Procedure: TRANSESOPHAGEAL ECHOCARDIOGRAM (TEE);  Surgeon: Prescott Gum, Collier Salina, MD;  Location: [facility];  Service: Open Heart Surgery;  Laterality: N/A;    Family History  Problem Relation Age of Onset   Heart disease Mother 1   Hypertension Mother    Cancer Father  Larynx   AAA (abdominal aortic aneurysm) Father     Social History   Socioeconomic History   Marital status: Married    Spouse name: Not on file   Number of children: Not on file   Years of education: Not on file   Highest education level: Not on file  Occupational History   Not on file  Tobacco Use   Smoking status: Former    Types: Cigarettes    Quit date: 05/13/1986    Years since quitting: 36.0   Smokeless tobacco: Never  Vaping Use   Vaping Use: Never used  Substance and Sexual Activity   Alcohol use: Yes    Alcohol/week: 3.0 standard drinks of alcohol    Types: 2  Cans of beer, 1 Glasses of wine per week    Comment: daily   Drug use: No   Sexual activity: Not on file  Other Topics Concern   Not on file  Social History Narrative   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     Social Determinants of Health   Financial Resource Strain: Not on file  Food Insecurity: Not on file  Transportation Needs: Not on file  Physical Activity: Not on file  Stress: Not on file  Social Connections: Not on file  Intimate Partner Violence: Not on file    Review of Systems  Respiratory:  Positive for shortness of breath.     Vitals:   05/28/22 0919  BP: 130/78  Pulse: (!) 53  SpO2: 95%     Physical Exam Constitutional:      Appearance: Normal appearance.  HENT:     Head: Normocephalic.     Nose: Nose normal.  Eyes:     General: No scleral icterus.    Pupils: Pupils are equal, round, and reactive to light.  Cardiovascular:     Rate and Rhythm: Normal rate and regular rhythm.     Heart sounds: No murmur heard.    No friction rub.  Pulmonary:     Effort: No respiratory distress.     Breath sounds: No stridor. No wheezing or rhonchi.  Musculoskeletal:     Cervical back: No rigidity or tenderness.  Neurological:     Mental Status: He is alert.  Psychiatric:        Mood and Affect: Mood normal.    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  History of coronary artery disease -S/p CABG 2019  Allergies with nasal stuffiness and congestion on a regular basis -This appears to be contributing to limitations as well  Plan/Recommendations: Schedule patient for pulmonary function test to be performed day of next visit  Prescription for Trelegy to be sent to pharmacy  Atrovent nasal to be used for nasal stuffiness  and congestion  Albuterol to be used as needed  Tentative follow-up end of April  Encouraged to stay active and call with any significant concerns   Sherrilyn Rist MD [facility] 05/28/2022, 9:50 AM  CC: [name], MD

## 2022-05-31 ENCOUNTER — Other Ambulatory Visit (HOSPITAL_COMMUNITY): Payer: Self-pay

## 2022-05-31 NOTE — Telephone Encounter (Signed)
Any alternative would still have the deductible being applied to the co-pays.  Breaking medication up into multiple inhalers would result in a higher total amount for the patient to pay typically.

## 2022-05-31 NOTE — Telephone Encounter (Signed)
Spoke with patient he advises trelegy will cost him $500, he also states he has not met his deductible for the year. Can we run a ticket on this patient insurance to see what's preferred and cheaper

## 2022-06-04 ENCOUNTER — Telehealth: Payer: Self-pay | Admitting: Pulmonary Disease

## 2022-06-04 NOTE — Telephone Encounter (Signed)
Called and spoke with patient. He verbalized understanding of AO's recs.   Nothing further needed at time of call.

## 2022-06-04 NOTE — Telephone Encounter (Signed)
No replacement  Nosebleeds can be a side effect  Stop using it for few days  If you need to use it again just use 1 spray once a day to see if this is better tolerated

## 2022-06-04 NOTE — Telephone Encounter (Signed)
Called and spoke with the pt  He states has used atrovent NS since 05/28/22 and it's helped "dry everything up"  He has noticed though, that he has had mild nose bleeds over the past 2 days  He has since stopped the atrovent and wants to know what he should take in it's place  Please advise, thanks!  Allergies  Allergen Reactions   Atorvastatin Other (See Comments)    Made pt not like himself

## 2022-06-04 NOTE — Telephone Encounter (Signed)
Pt. Calling about nasal spray that Dr. told him to take and he has been getting nose bleeds with the nasal spray to please advise pt. On what to do

## 2022-06-30 DIAGNOSIS — Z961 Presence of intraocular lens: Secondary | ICD-10-CM | POA: Diagnosis not present

## 2022-06-30 DIAGNOSIS — H52203 Unspecified astigmatism, bilateral: Secondary | ICD-10-CM | POA: Diagnosis not present

## 2022-07-28 ENCOUNTER — Other Ambulatory Visit: Payer: Self-pay | Admitting: Physician Assistant

## 2022-07-28 ENCOUNTER — Ambulatory Visit
Admission: RE | Admit: 2022-07-28 | Discharge: 2022-07-28 | Disposition: A | Discharge status: Home / Self Care | Payer: Medicare Other | Source: Ambulatory Visit | Attending: Physician Assistant | Admitting: Physician Assistant

## 2022-07-28 DIAGNOSIS — L989 Disorder of the skin and subcutaneous tissue, unspecified: Secondary | ICD-10-CM | POA: Diagnosis not present

## 2022-07-28 DIAGNOSIS — M19011 Primary osteoarthritis, right shoulder: Secondary | ICD-10-CM | POA: Diagnosis not present

## 2022-07-28 DIAGNOSIS — M25511 Pain in right shoulder: Secondary | ICD-10-CM

## 2022-07-28 DIAGNOSIS — G8929 Other chronic pain: Secondary | ICD-10-CM | POA: Diagnosis not present

## 2022-09-01 DIAGNOSIS — I25810 Atherosclerosis of coronary artery bypass graft(s) without angina pectoris: Secondary | ICD-10-CM | POA: Diagnosis not present

## 2022-09-01 DIAGNOSIS — I10 Essential (primary) hypertension: Secondary | ICD-10-CM | POA: Diagnosis not present

## 2022-09-01 DIAGNOSIS — Z0000 Encounter for general adult medical examination without abnormal findings: Secondary | ICD-10-CM | POA: Diagnosis not present

## 2022-09-01 DIAGNOSIS — E559 Vitamin D deficiency, unspecified: Secondary | ICD-10-CM | POA: Diagnosis not present

## 2022-09-01 DIAGNOSIS — J449 Chronic obstructive pulmonary disease, unspecified: Secondary | ICD-10-CM | POA: Diagnosis not present

## 2022-09-01 DIAGNOSIS — E538 Deficiency of other specified B group vitamins: Secondary | ICD-10-CM | POA: Diagnosis not present

## 2022-09-01 DIAGNOSIS — K52832 Lymphocytic colitis: Secondary | ICD-10-CM | POA: Diagnosis not present

## 2022-09-01 DIAGNOSIS — E785 Hyperlipidemia, unspecified: Secondary | ICD-10-CM | POA: Diagnosis not present

## 2022-09-01 DIAGNOSIS — Z79899 Other long term (current) drug therapy: Secondary | ICD-10-CM | POA: Diagnosis not present

## 2022-09-01 DIAGNOSIS — R55 Syncope and collapse: Secondary | ICD-10-CM | POA: Diagnosis not present

## 2022-09-08 DIAGNOSIS — D485 Neoplasm of uncertain behavior of skin: Secondary | ICD-10-CM | POA: Diagnosis not present

## 2022-09-08 DIAGNOSIS — L859 Epidermal thickening, unspecified: Secondary | ICD-10-CM | POA: Diagnosis not present

## 2022-09-08 DIAGNOSIS — L570 Actinic keratosis: Secondary | ICD-10-CM | POA: Diagnosis not present

## 2022-09-12 DIAGNOSIS — J189 Pneumonia, unspecified organism: Secondary | ICD-10-CM | POA: Diagnosis not present

## 2022-09-12 DIAGNOSIS — R058 Other specified cough: Secondary | ICD-10-CM | POA: Diagnosis not present

## 2022-09-12 DIAGNOSIS — R0609 Other forms of dyspnea: Secondary | ICD-10-CM | POA: Diagnosis not present

## 2022-09-15 DIAGNOSIS — J189 Pneumonia, unspecified organism: Secondary | ICD-10-CM | POA: Diagnosis not present

## 2022-09-27 DIAGNOSIS — M79669 Pain in unspecified lower leg: Secondary | ICD-10-CM | POA: Diagnosis not present

## 2022-09-27 DIAGNOSIS — R252 Cramp and spasm: Secondary | ICD-10-CM | POA: Diagnosis not present

## 2022-09-27 DIAGNOSIS — M25519 Pain in unspecified shoulder: Secondary | ICD-10-CM | POA: Diagnosis not present

## 2022-10-19 ENCOUNTER — Ambulatory Visit (INDEPENDENT_AMBULATORY_CARE_PROVIDER_SITE_OTHER): Payer: Medicare Other | Admitting: Pulmonary Disease

## 2022-10-19 ENCOUNTER — Encounter: Payer: Self-pay | Admitting: Pulmonary Disease

## 2022-10-19 VITALS — BP 110/72 | HR 60 | Ht 70.0 in | Wt 169.0 lb

## 2022-10-19 DIAGNOSIS — R0602 Shortness of breath: Secondary | ICD-10-CM

## 2022-10-19 NOTE — Patient Instructions (Signed)
Since you are doing very well, Will only need to see you as needed  Make sure you have albuterol available to use if significant shortness of breath  Call us with any significant concerns

## 2022-10-19 NOTE — Progress Notes (Signed)
Patrick Nguyen    [id number]    08/16/1940  Primary Care Physician:Gates, Molly Maduro, MD (Inactive)  Referring Physician: Marden [place], MD [address] [place],  [place] [address]  Chief complaint:   Follow-up for shortness of breath  HPI:  Symptoms have significantly improved since the last time he was here Has not been needing to use his rescue inhaler  Has not been using Trelegy, was too expensive for him to stay on  He had found some mold in his washing machine which he has managed to clean up well, feels this may have been responsible for some of his symptoms as well  He has tried to remain active, playing golf  Denies any significant limitations at present  He does have some sensitivities to grass, cats,  Quit smoking in the 1990, remembers having some shortness of breath at the time as well  History of coronary artery disease, status post CABG in 2019  He had had a PFT at that time showing obstructive disease with significant bronchodilator response  He does use Flonase regularly for nasal stuffiness and congestion  Does have a history of hypercholesterolemia, history of hypertension, history of heart failure  Music professor, retired  Outpatient Encounter Medications as of 10/19/2022  Medication Sig   aspirin EC 81 MG tablet Take 81 mg by mouth daily.   metoprolol tartrate (LOPRESSOR) 25 MG tablet TAKE 1/2 TABLET BY MOUTH TWICE A DAY   olmesartan (BENICAR) 5 MG tablet TAKE 1/2 TABLET BY MOUTH DAILY **PATIENT NEEDS AN APPOINTMENT**   rosuvastatin (CRESTOR) 20 MG tablet Take 1 tablet (20 mg total) by mouth every evening.   albuterol (VENTOLIN HFA) 108 (90 Base) MCG/ACT inhaler Inhale 2 puffs into the lungs every 6 (six) hours as needed for wheezing or shortness of breath. (Patient not taking: Reported on 10/19/2022)   Fluticasone-Umeclidin-Vilant (TRELEGY ELLIPTA) 100-62.5-25 MCG/ACT AEPB Inhale 1 puff into the lungs daily. (Patient not taking:  Reported on 10/19/2022)   ipratropium (ATROVENT) 0.03 % nasal spray Place 2 sprays into both nostrils every 12 (twelve) hours. (Patient not taking: Reported on 10/19/2022)   nitroGLYCERIN (NITROSTAT) 0.4 MG SL tablet Place 1 tablet (0.4 mg total) under the tongue every 5 (five) minutes as needed for chest pain.   No facility-administered encounter medications on file as of 10/19/2022.    Allergies as of 10/19/2022 - Review Complete 10/19/2022  Allergen Reaction Noted   Atorvastatin Other (See Comments) 05/13/2017    Past Medical History:  Diagnosis Date   Arthritis    CAD (coronary artery disease) 05/13/2017   s/p CABG w/ / LIMA-LAD, SVG-OM, SVG-PDA   Dyspnea    w/ exertion    Elevated coronary artery calcium score    GERD (gastroesophageal reflux disease)    hx   Hepatitis    in   high  school     Hyperlipidemia    Hypertension    Rosacea     Past Surgical History:  Procedure Laterality Date   BACK SURGERY N/A    CARDIAC CATHETERIZATION     CORONARY ARTERY BYPASS GRAFT N/A 06/07/2017   Procedure: CORONARY ARTERY BYPASS GRAFTING (CABG) x three, using left internal mammary artery and right leg greater saphenous vein harvested endoscopically;  Surgeon: Kerin Perna, MD;  Location: [facility];  Service: Open Heart Surgery;  Laterality: N/A;   CORONARY PRESSURE/FFR STUDY N/A 05/19/2017   Procedure: INTRAVASCULAR PRESSURE WIRE/FFR STUDY;  Surgeon: Lyn Records,  MD;  [facility]: [facility];  Service: Cardiovascular;  Laterality: N/A;   LEFT HEART CATH AND CORONARY ANGIOGRAPHY N/A 05/19/2017   Procedure: LEFT HEART CATH AND CORONARY ANGIOGRAPHY;  Surgeon: Lyn Records, MD;  Location: [facility];  Service: Cardiovascular;  Laterality: N/A;   ROTATOR CUFF REPAIR Left    TEE WITHOUT CARDIOVERSION N/A 06/07/2017   Procedure: TRANSESOPHAGEAL ECHOCARDIOGRAM (TEE);  Surgeon: Donata Clay, Theron Arista, MD;  Location: [facility];  Service: Open Heart Surgery;  Laterality: N/A;    Family  History  Problem Relation Age of Onset   Heart disease Mother 31   Hypertension Mother    Cancer Father        Larynx   AAA (abdominal aortic aneurysm) Father     Social History   Socioeconomic History   Marital status: Married    Spouse name: Not on file   Number of children: Not on file   Years of education: Not on file   Highest education level: Not on file  Occupational History   Not on file  Tobacco Use   Smoking status: Former    Types: Cigarettes    Quit date: 05/13/1986    Years since quitting: 36.4   Smokeless tobacco: Never  Vaping Use   Vaping Use: Never used  Substance and Sexual Activity   Alcohol use: Yes    Alcohol/week: 3.0 standard drinks of alcohol    Types: 2 Cans of beer, 1 Glasses of wine per week    Comment: daily   Drug use: No   Sexual activity: Not on file  Other Topics Concern   Not on file  Social History Narrative   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     Social Determinants of Health   Financial Resource Strain: Not on file  Food Insecurity: Not on file  Transportation Needs: Not on file  Physical Activity: Not on file  Stress: Not on file  Social Connections: Not on file  Intimate Partner Violence: Not on file    Review of Systems  Respiratory:  Positive for shortness of breath. Negative for cough.     Vitals:   10/19/22 1508  BP: 110/72  Pulse: 60  SpO2: 93%     Physical Exam Constitutional:      Appearance: Normal appearance.  HENT:     Head: Normocephalic.     Nose: Nose normal.  Eyes:     General: No scleral icterus.    Pupils: Pupils are equal, round, and reactive to light.  Cardiovascular:     Rate and Rhythm: Normal rate and regular rhythm.     Heart sounds: No murmur heard.    No friction rub.  Pulmonary:     Effort: No respiratory distress.     Breath sounds: No stridor. No wheezing or rhonchi.  Musculoskeletal:     Cervical back: No rigidity or tenderness.  Neurological:     Mental Status: He is  alert.  Psychiatric:        Mood and Affect: Mood normal.   Data Reviewed: PFT reviewed with the patient, was performed in 2019 PFT did reveal severe obstruction with significant bronchodilator response, moderate reduction in diffusing capacity  Assessment:  For shortness of breath has improved significantly  Currently not using any inhalers  History of coronary artery disease -S/p CABG 2019  Allergies with nasal stuffiness and congestion on a regular basis -This appears to be contributing to limitations as well, this is much  better at present  Plan/Recommendations: Since he has no significant complaints at the present time and feels he has not needed any inhalers at all I will only need to see him as needed  Encouraged to give [place] a call if he has any significant concerns  Encouraged to make sure he has a current prescription for albuterol to use as needed  He can call us if he needs to be seen  Virl Diamond MD [facility] 10/19/2022, 3:23 PM  CC: Marden [place], MD

## 2022-12-06 DIAGNOSIS — J3089 Other allergic rhinitis: Secondary | ICD-10-CM | POA: Diagnosis not present

## 2022-12-06 DIAGNOSIS — J342 Deviated nasal septum: Secondary | ICD-10-CM | POA: Diagnosis not present

## 2022-12-06 DIAGNOSIS — R0602 Shortness of breath: Secondary | ICD-10-CM | POA: Diagnosis not present

## 2022-12-06 DIAGNOSIS — J301 Allergic rhinitis due to pollen: Secondary | ICD-10-CM | POA: Diagnosis not present

## 2022-12-06 DIAGNOSIS — J3081 Allergic rhinitis due to animal (cat) (dog) hair and dander: Secondary | ICD-10-CM | POA: Diagnosis not present

## 2023-02-02 DIAGNOSIS — Z23 Encounter for immunization: Secondary | ICD-10-CM | POA: Diagnosis not present

## 2023-02-14 ENCOUNTER — Other Ambulatory Visit: Payer: Self-pay | Admitting: Cardiology

## 2023-02-28 DIAGNOSIS — J309 Allergic rhinitis, unspecified: Secondary | ICD-10-CM | POA: Diagnosis not present

## 2023-03-02 ENCOUNTER — Other Ambulatory Visit: Payer: Self-pay | Admitting: Cardiology

## 2023-03-18 DIAGNOSIS — H0015 Chalazion left lower eyelid: Secondary | ICD-10-CM | POA: Diagnosis not present

## 2023-03-18 DIAGNOSIS — H0012 Chalazion right lower eyelid: Secondary | ICD-10-CM | POA: Diagnosis not present

## 2023-03-22 ENCOUNTER — Encounter (INDEPENDENT_AMBULATORY_CARE_PROVIDER_SITE_OTHER): Payer: Self-pay | Admitting: Otolaryngology

## 2023-04-20 ENCOUNTER — Telehealth (INDEPENDENT_AMBULATORY_CARE_PROVIDER_SITE_OTHER): Payer: Self-pay | Admitting: Otolaryngology

## 2023-04-20 NOTE — Telephone Encounter (Signed)
 Spoke with patient and confirmed:  Date: 04/21/2023 Status: Sch  Time: 9:15 AM [address] [place] [address] Confirmed

## 2023-04-21 ENCOUNTER — Ambulatory Visit (INDEPENDENT_AMBULATORY_CARE_PROVIDER_SITE_OTHER): Payer: Medicare Other | Admitting: Otolaryngology

## 2023-04-21 ENCOUNTER — Encounter (INDEPENDENT_AMBULATORY_CARE_PROVIDER_SITE_OTHER): Payer: Self-pay

## 2023-04-21 ENCOUNTER — Telehealth (INDEPENDENT_AMBULATORY_CARE_PROVIDER_SITE_OTHER): Payer: Self-pay | Admitting: Otolaryngology

## 2023-04-21 VITALS — BP 153/77 | HR 58 | Resp 19 | Ht 70.0 in | Wt 169.0 lb

## 2023-04-21 DIAGNOSIS — R0982 Postnasal drip: Secondary | ICD-10-CM | POA: Diagnosis not present

## 2023-04-21 DIAGNOSIS — R0602 Shortness of breath: Secondary | ICD-10-CM | POA: Diagnosis not present

## 2023-04-21 DIAGNOSIS — R0981 Nasal congestion: Secondary | ICD-10-CM | POA: Diagnosis not present

## 2023-04-21 DIAGNOSIS — J3489 Other specified disorders of nose and nasal sinuses: Secondary | ICD-10-CM

## 2023-04-21 DIAGNOSIS — J342 Deviated nasal septum: Secondary | ICD-10-CM | POA: Diagnosis not present

## 2023-04-21 DIAGNOSIS — R490 Dysphonia: Secondary | ICD-10-CM | POA: Diagnosis not present

## 2023-04-21 DIAGNOSIS — J343 Hypertrophy of nasal turbinates: Secondary | ICD-10-CM | POA: Diagnosis not present

## 2023-04-21 MED ORDER — SALINE SPRAY 0.65 % NA SOLN: 2.0000 | NASAL | 0 refills | Status: AC | PRN

## 2023-04-21 MED ORDER — AZELASTINE HCL 0.1 % NA SOLN: 2.0000 | Freq: Two times a day (BID) | NASAL | 12 refills | Status: DC

## 2023-04-21 MED ORDER — IPRATROPIUM BROMIDE 0.06 % NA SOLN: 2.0000 | Freq: Two times a day (BID) | NASAL | 12 refills | Status: DC | PRN

## 2023-04-21 NOTE — Progress Notes (Signed)
 Dear Dr. Charlott, Here is my assessment for our mutual patient, Patrick Nguyen. Thank you for allowing me the opportunity to care for your patient. Please do not hesitate to contact me should you have any other questions. Sincerely, Dr. Eldora Blanch  Otolaryngology Clinic Note  HISTORY: Patrick Nguyen is a 83 y.o. male kindly referred by Dr. Charlott for evaluation of allergic rhinitis and post nasal drip.   Initial visit (04/2023): He reports that his primary complaint is post nasal drip and rhinitis. He reports that it has been ongoing for about a year. He also reports some intermittent congestion in the nose, but denies facial pressure/pain, hyposmia, or discolored drainage. Mucoid rhinorrhea, not thin or salty/metallic tasting. No dripping with head tilt or valsalva. He thinks that things are better some in certain environments (car), certain rooms in the house, cold is worse. Better in wintertime, worse in summer. No severity changes with barometric pressure. Occassional eye itching, but otherwise denies typical AR symptoms. No antecedent event including trauma or infections, but he did get COVID and PNA over last year. Associated symptoms including some dysphonia. He denies frequent sinus infections requiring treatment. He has tried singulair, atrovent , flonase, astelin , PO antihistamine, and Inhalers. He reports intermittent shortness of breath with activity, no problems with dyspnea at rest. No swallowing difficulties. Atrovent  helped some but did not last a long time. Allergy testing has been done (summer 2024) - and he reports allergies to grass, pet dander. No previous sinonasal surgery. No cough.   No reflux symptoms.  He is currently using flonase (once daily), zyrtec , montelukast.  AP/AC: ASA 81  Tobacco: quit 1990  PMHx: CAD s/p CABG 2019, SOB (with activity), HTN  RADIOGRAPHIC EVALUATION AND INDEPENDENT REVIEW OF OTHER RECORDS:: Virginia  Miller PA-C ([facility]) - 02/28/2023:  Long-standing issue for rhinitis, cough; worsening drainage, rhinorrhea, clear cough 2-3 months; SOB; Prior eval by cards, pulm, AR; Dx: AR; Rx: Montelukast - already on atrovent , zyrtec , flonase. Dr. Fleeta Smock (11/2022 - Allergies): Similar Sx; intermittent flares; Dx: Dev Septum, AR; Rx: Astelin  and Flonase; prior allergy to pet dander and grass Dr. Neda (10/19/2022) Pulm - SOB much improved; thinks mold may have been contributor; flonase for nasal congestion; since improved, f/u PRN for SOB Labs: 04/15/2021 CBC and CMP: WBC 6, Eos 200, no sig Kidney disease PFTs 2019 independently reviewed: reversible obstructive and restrictive dz  Past Medical History:  Diagnosis Date   Arthritis    CAD (coronary artery disease) 05/13/2017   s/p CABG w/ / LIMA-LAD, SVG-OM, SVG-PDA   Dyspnea    w/ exertion    Elevated coronary artery calcium  score    GERD (gastroesophageal reflux disease)    hx   Hepatitis    in   high  school     Hyperlipidemia    Hypertension    Rosacea    Past Surgical History:  Procedure Laterality Date   BACK SURGERY N/A    CARDIAC CATHETERIZATION     CORONARY ARTERY BYPASS GRAFT N/A 06/07/2017   Procedure: CORONARY ARTERY BYPASS GRAFTING (CABG) x three, using left internal mammary artery and right leg greater saphenous vein harvested endoscopically;  Surgeon: Fleeta Hanford Coy, MD;  Location: [facility] OR;  Service: Open Heart Surgery;  Laterality: N/A;   CORONARY PRESSURE/FFR STUDY N/A 05/19/2017   Procedure: INTRAVASCULAR PRESSURE WIRE/FFR STUDY;  Surgeon: Claudene Victory ORN, MD;  Location: [facility] INVASIVE CV LAB;  Service: Cardiovascular;  Laterality: N/A;   LEFT HEART CATH AND CORONARY ANGIOGRAPHY N/A 05/19/2017  Procedure: LEFT HEART CATH AND CORONARY ANGIOGRAPHY;  Surgeon: Claudene Victory ORN, MD;  Location: [facility] INVASIVE CV LAB;  Service: Cardiovascular;  Laterality: N/A;   ROTATOR CUFF REPAIR Left    TEE WITHOUT CARDIOVERSION N/A 06/07/2017   Procedure: TRANSESOPHAGEAL ECHOCARDIOGRAM  (TEE);  Surgeon: Fleeta Ochoa, Maude, MD;  Location: [facility] OR;  Service: Open Heart Surgery;  Laterality: N/A;   Family History  Problem Relation Age of Onset   Heart disease Mother 32   Hypertension Mother    Cancer Father        Larynx   AAA (abdominal aortic aneurysm) Father    Social History   Tobacco Use   Smoking status: Former    Current packs/day: 0.00    Types: Cigarettes    Quit date: 05/13/1986    Years since quitting: 36.9   Smokeless tobacco: Never  Substance Use Topics   Alcohol use: Yes    Alcohol/week: 3.0 standard drinks of alcohol    Types: 2 Cans of beer, 1 Glasses of wine per week    Comment: daily   Allergies  Allergen Reactions   Atorvastatin Other (See Comments)    Made pt not like himself   Current Outpatient Medications  Medication Sig Dispense Refill   azelastine  (ASTELIN ) 0.1 % nasal spray Place 2 sprays into both nostrils 2 (two) times daily. Use in each nostril as directed 30 mL 12   ipratropium (ATROVENT ) 0.06 % nasal spray Place 2 sprays into both nostrils 2 (two) times daily as needed for rhinitis. 15 mL 12   metoprolol  tartrate (LOPRESSOR ) 25 MG tablet TAKE 1/2 TABLET BY MOUTH TWICE A DAY 90 tablet 0   olmesartan  (BENICAR ) 5 MG tablet TAKE 1/2 TABLET BY MOUTH DAILY **PATIENT NEEDS AN APPOINTMENT** 45 tablet 1   rosuvastatin  (CRESTOR ) 20 MG tablet Take 1 tablet (20 mg total) by mouth every evening. 30 tablet 2   sodium chloride  (OCEAN) 0.65 % SOLN nasal spray Place 2 sprays into both nostrils every 4 (four) hours as needed for congestion. 15 mL 0   albuterol  (VENTOLIN  HFA) 108 (90 Base) MCG/ACT inhaler Inhale 2 puffs into the lungs every 6 (six) hours as needed for wheezing or shortness of breath. (Patient not taking: Reported on 04/21/2023) 8 g 3   aspirin  EC 81 MG tablet Take 81 mg by mouth daily. (Patient not taking: Reported on 04/21/2023)     Fluticasone-Umeclidin-Vilant (TRELEGY ELLIPTA ) 100-62.5-25 MCG/ACT AEPB Inhale 1 puff into the lungs daily.  (Patient not taking: Reported on 04/21/2023) 60 each 3   nitroGLYCERIN  (NITROSTAT ) 0.4 MG SL tablet Place 1 tablet (0.4 mg total) under the tongue every 5 (five) minutes as needed for chest pain. 25 tablet 3   No current facility-administered medications for this visit.   BP (!) 153/77 (BP Location: Right Arm, Patient Position: Sitting, Cuff Size: Normal)   Pulse (!) 58   Resp 19   Ht 5' 10 (1.778 m)   Wt 169 lb (76.7 kg)   SpO2 91%   BMI 24.25 kg/m   PHYSICAL EXAM:  BP (!) 153/77 (BP Location: Right Arm, Patient Position: Sitting, Cuff Size: Normal)   Pulse (!) 58   Resp 19   Ht 5' 10 (1.778 m)   Wt 169 lb (76.7 kg)   SpO2 91%   BMI 24.25 kg/m   Salient findings:  CN II-XII intact  Bilateral EAC clear and TM intact with well pneumatized middle ear spaces Nose: Anterior rhinoscopy reveals modest right septal deviation, left >  right inferior turbiante hypertrophy.  Nasal endoscopy was indicated to better evaluate the nose and paranasal sinuses, given the patient's history and exam findings, and is detailed below. No lesions of oral cavity/oropharynx No obviously palpable neck masses/lymphadenopathy/thyromegaly No respiratory distress or stridor; voice quality class 1.5 - intermittent slight harshness in quality of voice   PROCEDURE: Diagnostic Nasal Endoscopy Pre-procedure diagnosis: Post nasal drip, concern for sinusitis; nasal congestion Post-procedure diagnosis: same Indication: See pre-procedure diagnosis and physical exam above Complications: None apparent EBL: 0 mL Anesthesia: Lidocaine  4% and topical decongestant was topically sprayed in each nasal cavity  Description of Procedure:  Patient was identified. A rigid 30 degree endoscope was utilized to evaluate the sinonasal cavities, mucosa, sinus ostia and turbinates and septum.  Overall, signs of mucosal inflammation are not noted.  No mucopurulence, polyps, or masses noted.   Right Middle meatus: clear Right SE  Recess: clear Left MM: clear Left SE Recess: clear Minimal secretions post nasally/NP area   Photodocumentation was obtained.  CPT CODE -- 68768 - Mod 25  Procedure Note Pre-procedure diagnosis:  Dysphonia, shortness of breath Post-procedure diagnosis: Same Procedure: Transnasal Fiberoptic Laryngoscopy, CPT 31575 - Mod 25 Indication: dysphonia, shortness of breath Complications: None apparent EBL: 0 mL  The procedure was undertaken to further evaluate the patient's complaint of dysphonia, shortness of breath, prior obstructive PFT, with mirror exam inadequate for appropriate examination due to gag reflex and poor patient tolerance  Procedure:  Patient was identified as correct patient. Verbal consent was obtained. The nose was sprayed with oxymetazoline and 4% lidocaine . The The flexible laryngoscope was passed through the nose to view the nasal cavity, pharynx (oropharynx, hypopharynx) and larynx.  The larynx was examined at rest and during multiple phonatory tasks. Documentation was obtained and reviewed with patient. The scope was removed. The patient tolerated the procedure well.  Findings: The nasal cavity and nasopharynx did not reveal any masses or lesions, mucosa appeared to be without obvious lesions. The tongue base, pharyngeal walls, piriform sinuses, vallecula, epiglottis and postcricoid region are normal in appearance; minimal post-cricoid edema, no significant retained secretions. The visualized portion of the subglottis and proximal trachea is widely patent. The vocal folds are mobile bilaterally. There are no lesions on the free edge of the vocal folds nor elsewhere in the larynx worrisome for malignancy. Mild AP compression related to MTD     ASSESSMENT:  Mr. Patrick Nguyen is a 83 yo M with h/o CAD s/p CABG and some SOB now with:  1. Rhinorrhea   2. Dysphonia   3. Hypertrophy of both inferior nasal turbinates   4. Nasal congestion   5. Nasal septal deviation   6.  Post-nasal drip   7. Shortness of breath    His symptoms are primarily related to rhinorrhea and PND for which he has to clear his nose. It is mucoid, and bilateral, associated with certain environments and places. No other significant triggers and he denies typical AR symptoms. He has seen allergy before and tried various medications, none of which have provided fair benefit except for perhaps atrovent . He also has some intermittent SOB and has been eval'd by Pulm with prior PFTs with reversible obstructive defect. Some dysphonia which is related to times of his rhinorrhea. Do not suspect CSF at this point given history and exam.  PLAN: We've discussed issues and options today.  We reviewed the nasal endoscopy images together.  The risks, benefits and alternatives were discussed and questions answered. We will manage conservatively and  will maximally medically manage to see if he improves. He has elected to proceed with:  1) Daily Nasal Rinses 2) Start BID astelin  3) Start BID atrovent  PRN for dripping 4) Will do ocean spray PRN for any dryness 5) If SOB persists, can consider re-refer to Pulm; dysphonia most likely related to rhinorrhea and PND 6) Follow-up in 3 months -- sooner as necessary.  See below regarding exact medications prescribed this encounter including dosages and route: Meds ordered this encounter  Medications   azelastine  (ASTELIN ) 0.1 % nasal spray    Sig: Place 2 sprays into both nostrils 2 (two) times daily. Use in each nostril as directed    Dispense:  30 mL    Refill:  12   ipratropium (ATROVENT ) 0.06 % nasal spray    Sig: Place 2 sprays into both nostrils 2 (two) times daily as needed for rhinitis.    Dispense:  15 mL    Refill:  12   sodium chloride  (OCEAN) 0.65 % SOLN nasal spray    Sig: Place 2 sprays into both nostrils every 4 (four) hours as needed for congestion.    Dispense:  15 mL    Refill:  0     Thank you for allowing me the opportunity to care for  your patient. Please do not hesitate to contact me should you have any other questions.  Sincerely, Eldora Blanch, MD Otolarynoglogist (ENT), [facility] Phone: [phone] Fax: [phone]  MDM:  Level 4: [phone] Complexity/Problems addressed: mod - multiple chronic medical problems Data complexity: mod - independent interpretation of notes, labs - Morbidity: mod   - Prescription Drug prescribed or managed: yes  04/21/2023, 12:44 PM

## 2023-04-21 NOTE — Patient Instructions (Signed)
 Use flonase two sprays each nostril twice per day Use astelin two sprays each nostril twice per day Use atrovent spray two sprays each nostril twice per day -- you can use ocean spray as needed to prevent dryness

## 2023-04-21 NOTE — Telephone Encounter (Signed)
 Verified [employer] insurance by website:

## 2023-05-12 ENCOUNTER — Other Ambulatory Visit: Payer: Self-pay | Admitting: Cardiology

## 2023-05-30 ENCOUNTER — Telehealth: Payer: Self-pay | Admitting: Cardiology

## 2023-05-30 NOTE — Telephone Encounter (Signed)
Received a call from patient he stated he has been tired,sob for the past 2 months.He has gained weight.No swelling noticed.Appointment scheduled with Edd Fabian NP 2/20 at 8:50 am.Advised if symptoms worsen he will need to go to ED to be evaluated.

## 2023-05-30 NOTE — Telephone Encounter (Signed)
Pt c/o Shortness Of Breath: STAT if SOB developed within the last 24 hours or pt is noticeably SOB on the phone  1. Are you currently SOB (can you hear that pt is SOB on the phone)? Not at this time  2. How long have you been experiencing SOB? A couple of months- worse recently  3. Are you SOB when sitting or when up moving around? When he moves around, also going up and down steps  4. Are you currently experiencing any other symptoms? Extremely tired

## 2023-05-31 NOTE — Progress Notes (Deleted)
 Cardiology Clinic Note   Patient Name: Patrick Nguyen Date of Encounter: 05/31/2023  Primary Care Provider:  Emilio Aspen, MD Primary Cardiologist:  Rollene Rotunda, MD  Patient Profile    Patrick Nguyen 83 year old male presents the clinic today for follow-up evaluation of his coronary artery disease status post CABG and hyperlipidemia.  Past Medical History    Past Medical History:  Diagnosis Date   Arthritis    CAD (coronary artery disease) 05/13/2017   s/p CABG w/ / LIMA-LAD, SVG-OM, SVG-PDA   Dyspnea    w/ exertion    Elevated coronary artery calcium score    GERD (gastroesophageal reflux disease)    hx   Hepatitis    in   high  school     Hyperlipidemia    Hypertension    Rosacea    Past Surgical History:  Procedure Laterality Date   BACK SURGERY N/A    CARDIAC CATHETERIZATION     CORONARY ARTERY BYPASS GRAFT N/A 06/07/2017   Procedure: CORONARY ARTERY BYPASS GRAFTING (CABG) x three, using left internal mammary artery and right leg greater saphenous vein harvested endoscopically;  Surgeon: Kerin Perna, MD;  Location: [facility] OR;  Service: Open Heart Surgery;  Laterality: N/A;   CORONARY PRESSURE/FFR STUDY N/A 05/19/2017   Procedure: INTRAVASCULAR PRESSURE WIRE/FFR STUDY;  Surgeon: Lyn Records, MD;  Location: [facility] INVASIVE CV LAB;  Service: Cardiovascular;  Laterality: N/A;   LEFT HEART CATH AND CORONARY ANGIOGRAPHY N/A 05/19/2017   Procedure: LEFT HEART CATH AND CORONARY ANGIOGRAPHY;  Surgeon: Lyn Records, MD;  Location: [facility] INVASIVE CV LAB;  Service: Cardiovascular;  Laterality: N/A;   ROTATOR CUFF REPAIR Left    TEE WITHOUT CARDIOVERSION N/A 06/07/2017   Procedure: TRANSESOPHAGEAL ECHOCARDIOGRAM (TEE);  Surgeon: Donata Clay, Theron Arista, MD;  Location: [facility] OR;  Service: Open Heart Surgery;  Laterality: N/A;    Allergies  Allergies  Allergen Reactions   Atorvastatin Other (See Comments)    Made pt not like himself    History of Present Illness     Patrick Nguyen has a PMH of coronary artery disease status post CABG, hyperlipidemia, HTN, and shortness of breath.  He underwent CABG with LIMA-LAD, SVG-OM, SVG-PDA in 2019.  He has also had COVID x 2.  Since that time he has had some hoarseness and shallow breathing.  He teaches and plays wind instruments.    He previously reported that he did not feel that he could breathe as well anymore.  He reported sinus congestion.  He did note some lightheadedness and balance issues during his follow-up appointment with Dr. Antoine Poche 05/07/2022.  He denied chest pain, weight gain and edema.   He contacted the nurse triage line on 05/30/2023.  He reported that he had been gaining weight.  He did not notice swelling.  He was added to my schedule.  He presents to the clinic today for evaluation and states***.  *** denies chest pain, shortness of breath, lower extremity edema, fatigue, palpitations, melena, hematuria, hemoptysis, diaphoresis, weakness, presyncope, syncope, orthopnea, and PND.  Coronary artery disease-status post CABG.  Denies chest discomfort.  Denies anginal type symptoms. Heart healthy low-sodium diet Increase physical activity as tolerated Continue aspirin, metoprolol, olmesartan, rosuvastatin Order CBC, CMP  Hyperlipidemia-LDL***. High-fiber diet Continue aspirin, rosuvastatin Order fasting lipids  Essential hypertension-BP today***. Maintain blood pressure log Her healthy low-sodium diet Continue olmesartan, metoprolol  Weight gain-euvolemic.  Appears to be weight gain related to increased calorie intake and reduced physical activity.  Encouraged reducing calories in diet. High-fiber diet Increase physical activity as tolerated  DOE***. Order echocardiogram?  Disposition: Follow-up with Dr. Antoine Poche or me in 6-9 months.  Home Medications    Prior to Admission medications   Medication Sig Start Date End Date Taking? Authorizing Provider  albuterol (VENTOLIN HFA) 108  (90 Base) MCG/ACT inhaler Inhale 2 puffs into the lungs every 6 (six) hours as needed for wheezing or shortness of breath. Patient not taking: Reported on 04/21/2023 05/28/22   Tomma Lightning, MD  aspirin EC 81 MG tablet Take 81 mg by mouth daily. Patient not taking: Reported on 04/21/2023    [provider]  azelastine (ASTELIN) 0.1 % nasal spray Place 2 sprays into both nostrils 2 (two) times daily. Use in each nostril as directed 04/21/23   Read Drivers, MD  Fluticasone-Umeclidin-Vilant (TRELEGY ELLIPTA) 100-62.5-25 MCG/ACT AEPB Inhale 1 puff into the lungs daily. Patient not taking: Reported on 04/21/2023 05/28/22   Virl Diamond A, MD  ipratropium (ATROVENT) 0.06 % nasal spray Place 2 sprays into both nostrils 2 (two) times daily as needed for rhinitis. 04/21/23   Read Drivers, MD  metoprolol tartrate (LOPRESSOR) 25 MG tablet TAKE 1/2 TABLET BY MOUTH TWICE A DAY 05/12/23   Rollene Rotunda, MD  nitroGLYCERIN (NITROSTAT) 0.4 MG SL tablet Place 1 tablet (0.4 mg total) under the tongue every 5 (five) minutes as needed for chest pain. 03/27/18 05/07/22  Rollene Rotunda, MD  olmesartan (BENICAR) 5 MG tablet TAKE 1/2 TABLET BY MOUTH DAILY **PATIENT NEEDS AN APPOINTMENT** 03/02/23   Rollene Rotunda, MD  rosuvastatin (CRESTOR) 20 MG tablet Take 1 tablet (20 mg total) by mouth every evening. 07/20/17   Lovett Sox, MD  sodium chloride (OCEAN) 0.65 % SOLN nasal spray Place 2 sprays into both nostrils every 4 (four) hours as needed for congestion. 04/21/23   Read Drivers, MD    Family History    Family History  Problem Relation Age of Onset   Heart disease Mother 55   Hypertension Mother    Cancer Father        Larynx   AAA (abdominal aortic aneurysm) Father    He indicated that his mother is deceased. He indicated that his father is deceased.  Social History    Social History   Socioeconomic History   Marital status: Married    Spouse name: Not on file   Number of children:  Not on file   Years of education: Not on file   Highest education level: Not on file  Occupational History   Not on file  Tobacco Use   Smoking status: Former    Current packs/day: 0.00    Types: Cigarettes    Quit date: 05/13/1986    Years since quitting: 37.0   Smokeless tobacco: Never  Vaping Use   Vaping status: Never Used  Substance and Sexual Activity   Alcohol use: Yes    Alcohol/week: 3.0 standard drinks of alcohol    Types: 2 Cans of beer, 1 Glasses of wine per week    Comment: daily   Drug use: No   Sexual activity: Not on file  Other Topics Concern   Not on file  Social History Narrative   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     Social Drivers of Corporate investment banker Strain: Not on file  Food Insecurity: Not on file  Transportation Needs: Not on file  Physical Activity: Not on file  Stress: Not  on file  Social Connections: Not on file  Intimate Partner Violence: Not on file     Review of Systems    General:  No chills, fever, night sweats or weight changes.  Cardiovascular:  No chest pain, dyspnea on exertion, edema, orthopnea, palpitations, paroxysmal nocturnal dyspnea. Dermatological: No rash, lesions/masses Respiratory: No cough, dyspnea Urologic: No hematuria, dysuria Abdominal:   No nausea, vomiting, diarrhea, bright red blood per rectum, melena, or hematemesis Neurologic:  No visual changes, wkns, changes in mental status. All other systems reviewed and are otherwise negative except as noted above.  Physical Exam    VS:  There were no vitals taken for this visit. , BMI There is no height or weight on file to calculate BMI. GEN: Well nourished, well developed, in no acute distress. HEENT: normal. Neck: Supple, no JVD, carotid bruits, or masses. Cardiac: RRR, no murmurs, rubs, or gallops. No clubbing, cyanosis, edema.  Radials/DP/PT 2+ and equal bilaterally.  Respiratory:  Respirations regular and unlabored, clear to auscultation  bilaterally. GI: Soft, nontender, nondistended, BS + x 4. MS: no deformity or atrophy. Skin: warm and dry, no rash. Neuro:  Strength and sensation are intact. Psych: Normal affect.  Accessory Clinical Findings    Recent Labs: No results found for requested labs within last 365 days.   Recent Lipid Panel    Component Value Date/Time   CHOL 134 09/26/2017 0838   TRIG 73 09/26/2017 0838   HDL 70 09/26/2017 0838   CHOLHDL 1.9 09/26/2017 0838   LDLCALC 49 09/26/2017 0838    No BP recorded.  {Refresh Note OR Click here to enter BP  :1}***    ECG personally reviewed by me today- ***    Echocardiogram 06/07/2017  Left Ventricle Normal cavity size, left ventricular diastolic function and left atrial pressure. Concentric hypertrophy of mild severity. LV systolic function is normal with an EF of 60-65%.  No thrombus present. No mass present.  Septum Normal atrial and ventricular septum with no septal defect and normal septal motion.  Left Atrium Cavity is mildly to moderately dilated. No thrombus present. No left atrial appendage.  No mass present.  Aortic Valve Structurally normal trileaflet aortic valve with no regurgitation or stenosis.  Aorta No aneurysm present. Plaque present in the descending aorta is mild (Grade 2: Mild-focal or diffuse int. thick. of 2-10mm).  No graft present. No significant coarctation present.  No aortic dissection present  Mitral Valve Normal valve structure. Normal transmitral gradient. No stenosis. Normal leaflet mobility. Trace regurgitation.  Right Ventricle Normal wall thickness and ejection fraction. Cavity is mildly dilated.  Right Atrium Normal right atrial size.  Tricuspid Valve Normal tricuspid valve structure. No stenosis. No regurgitation.  Pulmonic Valve Normal pulmonic valve structure. No stenosis. No regurgitation.  Pericardium Normal pericardium with no pericardial effusion.  Systemic Vein IVC measures <2.1 cm and collapses >50%, suggesting a  normal RA pressure of 3 mmHg. Normal SVC size.  Pulmonary Artery Normal pulmonary artery with no dilation.  Post-Op TEE AORTA Aorta unchanged from pre-bypass.   LEFT VENTRICLE Left ventricle unchanged from pre-bypass.   RIGHT VENTRICLE Right ventricle unchanged from pre-bypass.   AORTIC VALVE Aortic valve unchanged from pre-bypass MITRAL VALVE Mitral valve unchanged from pre-bypass.   TRICUSPID VALVE Tricuspid valve unchanged from pre-bypass.        Assessment & Plan   1.  ***   Patrick Nguyen. Patrick Dembeck NP-C     05/31/2023, 6:53 AM [facility] [address] Office [phone] Fax [phone]    I spent***minutes examining this patient, reviewing medications, and using patient centered shared decision making involving their cardiac care.   I spent  20 minutes reviewing past medical history,  medications, and prior cardiac tests.

## 2023-06-02 ENCOUNTER — Ambulatory Visit: Payer: Medicare Other | Admitting: General Practice

## 2023-06-06 ENCOUNTER — Other Ambulatory Visit: Payer: Self-pay | Admitting: Cardiology

## 2023-06-06 ENCOUNTER — Ambulatory Visit
Admission: EM | Admit: 2023-06-06 | Discharge: 2023-06-06 | Disposition: A | Discharge status: Home / Self Care | Payer: Medicare Other | Attending: Internal Medicine | Admitting: Internal Medicine

## 2023-06-06 ENCOUNTER — Encounter: Payer: Self-pay | Admitting: Emergency Medicine

## 2023-06-06 ENCOUNTER — Encounter (HOSPITAL_BASED_OUTPATIENT_CLINIC_OR_DEPARTMENT_OTHER): Payer: Self-pay | Admitting: Emergency Medicine

## 2023-06-06 ENCOUNTER — Other Ambulatory Visit: Payer: Self-pay

## 2023-06-06 ENCOUNTER — Emergency Department (HOSPITAL_BASED_OUTPATIENT_CLINIC_OR_DEPARTMENT_OTHER)
Admission: EM | Admit: 2023-06-06 | Discharge: 2023-06-06 | Disposition: A | Discharge status: Home / Self Care | Payer: Medicare Other | Attending: Emergency Medicine | Admitting: Emergency Medicine

## 2023-06-06 ENCOUNTER — Emergency Department (HOSPITAL_BASED_OUTPATIENT_CLINIC_OR_DEPARTMENT_OTHER): Payer: Medicare Other

## 2023-06-06 DIAGNOSIS — Z8679 Personal history of other diseases of the circulatory system: Secondary | ICD-10-CM

## 2023-06-06 DIAGNOSIS — Z87891 Personal history of nicotine dependence: Secondary | ICD-10-CM | POA: Diagnosis not present

## 2023-06-06 DIAGNOSIS — R0602 Shortness of breath: Secondary | ICD-10-CM | POA: Insufficient documentation

## 2023-06-06 DIAGNOSIS — R051 Acute cough: Secondary | ICD-10-CM | POA: Insufficient documentation

## 2023-06-06 DIAGNOSIS — J101 Influenza due to other identified influenza virus with other respiratory manifestations: Secondary | ICD-10-CM | POA: Insufficient documentation

## 2023-06-06 DIAGNOSIS — E785 Hyperlipidemia, unspecified: Secondary | ICD-10-CM | POA: Diagnosis not present

## 2023-06-06 DIAGNOSIS — Z79899 Other long term (current) drug therapy: Secondary | ICD-10-CM | POA: Diagnosis not present

## 2023-06-06 DIAGNOSIS — E7800 Pure hypercholesterolemia, unspecified: Secondary | ICD-10-CM | POA: Diagnosis not present

## 2023-06-06 DIAGNOSIS — Z951 Presence of aortocoronary bypass graft: Secondary | ICD-10-CM | POA: Insufficient documentation

## 2023-06-06 DIAGNOSIS — I10 Essential (primary) hypertension: Secondary | ICD-10-CM | POA: Insufficient documentation

## 2023-06-06 DIAGNOSIS — I2510 Atherosclerotic heart disease of native coronary artery without angina pectoris: Secondary | ICD-10-CM | POA: Insufficient documentation

## 2023-06-06 DIAGNOSIS — R5382 Chronic fatigue, unspecified: Secondary | ICD-10-CM | POA: Insufficient documentation

## 2023-06-06 DIAGNOSIS — R059 Cough, unspecified: Secondary | ICD-10-CM | POA: Diagnosis not present

## 2023-06-06 LAB — COMPREHENSIVE METABOLIC PANEL
ALT: 36 U/L (ref 0–44)
AST: 37 U/L (ref 15–41)
Albumin: 3.7 g/dL (ref 3.5–5.0)
Alkaline Phosphatase: 65 U/L (ref 38–126)
Anion gap: 12 (ref 5–15)
BUN: 14 mg/dL (ref 8–23)
CO2: 24 mmol/L (ref 22–32)
Calcium: 8.7 mg/dL — ABNORMAL LOW (ref 8.9–10.3)
Chloride: 101 mmol/L (ref 98–111)
Creatinine, Ser: 0.9 mg/dL (ref 0.61–1.24)
GFR, Estimated: >60 mL/min (ref >60)
Glucose, Bld: 94 mg/dL (ref 70–99)
Potassium: 4.5 mmol/L (ref 3.5–5.1)
Sodium: 137 mmol/L (ref 135–145)
Total Bilirubin: 1.2 mg/dL (ref 0.0–1.2)
Total Protein: 7.6 g/dL (ref 6.5–8.1)

## 2023-06-06 LAB — CBC WITH DIFFERENTIAL/PLATELET
Abs Immature Granulocytes: 0.02 10*3/uL (ref 0.00–0.07)
Basophils Absolute: 0.1 10*3/uL (ref 0.0–0.1)
Basophils Relative: 1 %
Eosinophils Absolute: 0.1 10*3/uL (ref 0.0–0.5)
Eosinophils Relative: 1 %
HCT: 46.3 % (ref 39.0–52.0)
Hemoglobin: 14.9 g/dL (ref 13.0–17.0)
Immature Granulocytes: 0 %
Lymphocytes Relative: 17 %
Lymphs Abs: 1.1 10*3/uL (ref 0.7–4.0)
MCH: 30.2 pg (ref 26.0–34.0)
MCHC: 32.2 g/dL (ref 30.0–36.0)
MCV: 93.9 fL (ref 80.0–100.0)
Monocytes Absolute: 1.1 10*3/uL — ABNORMAL HIGH (ref 0.1–1.0)
Monocytes Relative: 18 %
Neutro Abs: 4 10*3/uL (ref 1.7–7.7)
Neutrophils Relative %: 63 %
Platelets: 203 10*3/uL (ref 150–400)
RBC: 4.93 MIL/uL (ref 4.22–5.81)
RDW: 12.9 % (ref 11.5–15.5)
WBC: 6.2 10*3/uL (ref 4.0–10.5)
nRBC: 0 % (ref 0.0–0.2)

## 2023-06-06 LAB — RESP PANEL BY RT-PCR (RSV, FLU A&B, COVID)  RVPGX2
Influenza A by PCR: POSITIVE — AB
Influenza B by PCR: NEGATIVE
Resp Syncytial Virus by PCR: NEGATIVE
SARS Coronavirus 2 by RT PCR: NEGATIVE

## 2023-06-06 LAB — TROPONIN I (HIGH SENSITIVITY): Troponin I (High Sensitivity): 9 ng/L (ref <18)

## 2023-06-06 MED ORDER — SODIUM CHLORIDE 0.9 % IV BOLUS
1000.0000 mL | Freq: Once | INTRAVENOUS | Status: AC
  Administered 2023-06-06: 1000 mL via INTRAVENOUS

## 2023-06-06 NOTE — ED Provider Notes (Signed)
 [place] [facility] Provider Note   CSN: [id number] Arrival date & time: 06/06/23  1437     History  Chief Complaint  Patient presents with   Cough    Patrick Nguyen is a 83 y.o. male.  Patient here with cough for couple days been increased fatigue the last 2 weeks.  History of CAD.  Denies any chest pain shortness of breath has had a dry cough the last couple days.  Does not know if he is had a fever.  History of high cholesterol high blood pressure.  Denies any weakness numbness tingling.  The history is provided by the patient.       Home Medications Prior to Admission medications   Medication Sig Start Date End Date Taking? Authorizing Provider  albuterol (VENTOLIN HFA) 108 (90 Base) MCG/ACT inhaler Inhale 2 puffs into the lungs every 6 (six) hours as needed for wheezing or shortness of breath. Patient not taking: Reported on 04/21/2023 05/28/22   Tomma Lightning, MD  aspirin EC 81 MG tablet Take 81 mg by mouth daily. Patient not taking: Reported on 04/21/2023    [provider]  azelastine (ASTELIN) 0.1 % nasal spray Place 2 sprays into both nostrils 2 (two) times daily. Use in each nostril as directed 04/21/23   Read Drivers, MD  Fluticasone-Umeclidin-Vilant (TRELEGY ELLIPTA) 100-62.5-25 MCG/ACT AEPB Inhale 1 puff into the lungs daily. Patient not taking: Reported on 04/21/2023 05/28/22   Virl Diamond A, MD  ipratropium (ATROVENT) 0.06 % nasal spray Place 2 sprays into both nostrils 2 (two) times daily as needed for rhinitis. 04/21/23   Read Drivers, MD  metoprolol tartrate (LOPRESSOR) 25 MG tablet TAKE 1/2 TABLET BY MOUTH TWICE A DAY 05/12/23   Rollene Rotunda, MD  nitroGLYCERIN (NITROSTAT) 0.4 MG SL tablet Place 1 tablet (0.4 mg total) under the tongue every 5 (five) minutes as needed for chest pain. 03/27/18 05/07/22  Rollene Rotunda, MD  olmesartan (BENICAR) 5 MG tablet TAKE 1/2 TABLET BY MOUTH DAILY **PATIENT NEEDS AN  APPOINTMENT** 06/06/23   Rollene Rotunda, MD  rosuvastatin (CRESTOR) 20 MG tablet Take 1 tablet (20 mg total) by mouth every evening. 07/20/17   Lovett Sox, MD  sodium chloride (OCEAN) 0.65 % SOLN nasal spray Place 2 sprays into both nostrils every 4 (four) hours as needed for congestion. 04/21/23   Read Drivers, MD      Allergies    Atorvastatin    Review of Systems   Review of Systems  Physical Exam Updated Vital Signs BP (!) 153/70 (BP Location: Right Arm)   Pulse 66   Temp 98.6 F (37 C)   Resp 18   Ht 5\' 10"  (1.778 m)   Wt 78 kg   SpO2 98%   BMI 24.68 kg/m  Physical Exam Vitals and nursing note reviewed.  Constitutional:      General: He is not in acute distress.    Appearance: He is well-developed. He is not ill-appearing.  HENT:     Head: Normocephalic and atraumatic.     Nose: Nose normal.     Mouth/Throat:     Mouth: Mucous membranes are moist.  Eyes:     Extraocular Movements: Extraocular movements intact.     Conjunctiva/sclera: Conjunctivae normal.     Pupils: Pupils are equal, round, and reactive to light.  Cardiovascular:     Rate and Rhythm: Normal rate and regular rhythm.     Pulses: Normal pulses.  Heart sounds: Normal heart sounds. No murmur heard. Pulmonary:     Effort: Pulmonary effort is normal. No respiratory distress.     Breath sounds: Normal breath sounds.  Abdominal:     General: Abdomen is flat.     Palpations: Abdomen is soft.     Tenderness: There is no abdominal tenderness.  Musculoskeletal:        General: No swelling.     Cervical back: Normal range of motion and neck supple.  Skin:    General: Skin is warm and dry.     Capillary Refill: Capillary refill takes less than 2 seconds.  Neurological:     General: No focal deficit present.     Mental Status: He is alert.  Psychiatric:        Mood and Affect: Mood normal.     ED Results / Procedures / Treatments   Labs (all labs ordered are listed, but only abnormal  results are displayed) Labs Reviewed  RESP PANEL BY RT-PCR (RSV, FLU A&B, COVID)  RVPGX2 - Abnormal; Notable for the following components:      Result Value   Influenza A by PCR POSITIVE (*)    All other components within normal limits  CBC WITH DIFFERENTIAL/PLATELET - Abnormal; Notable for the following components:   Monocytes Absolute 1.1 (*)    All other components within normal limits  COMPREHENSIVE METABOLIC PANEL - Abnormal; Notable for the following components:   Calcium 8.7 (*)    All other components within normal limits  TROPONIN I (HIGH SENSITIVITY)    EKG EKG Interpretation Date/Time:  Monday June 06 2023 16:30:21 EST Ventricular Rate:  72 PR Interval:  175 QRS Duration:  139 QT Interval:  442 QTC Calculation: 484 R Axis:   83  Text Interpretation: Sinus rhythm Right bundle branch block Confirmed by Virgina Norfolk [phone]) on 06/06/2023 4:37:07 PM  Radiology DG Chest 2 View Result Date: 06/06/2023 CLINICAL DATA:  Cough for a week.  Feeling tired.  Prior CABG EXAM: CHEST - 2 VIEW COMPARISON:  X-ray 09/12/2022 FINDINGS: Hyperinflation. Sternal wires. No consolidation, pneumothorax or effusion. No edema. Normal cardiopericardial silhouette. Degenerative changes seen along the spine. Presumed nipple shadows. Unchanged from prior IMPRESSION: Postop chest.  Hyperinflation.  No acute cardiopulmonary disease. Electronically Signed   By: Karen Kays M.D.   On: 06/06/2023 16:37    Procedures Procedures    Medications Ordered in ED Medications  sodium chloride 0.9 % bolus 1,000 mL (0 mLs Intravenous Stopped 06/06/23 1726)    ED Course/ Medical Decision Making/ A&P                                 Medical Decision Making Amount and/or Complexity of Data Reviewed Labs: ordered. Radiology: ordered.   Patrick Nguyen is here with cough general fatigue.  Unremarkable vitals.  No fever.  History of CAD.  Differential diagnosis could be viral process versus pneumonia  versus dehydration versus less likely ACS.  Will get CBC CMP troponin EKG chest x-ray COVID flu RSV testing.  Will give IV fluids.  Overall patient positive for influenza A.  Troponin normal.  No significant anemia, electrolyte abnormality, kidney injury or leukocytosis.  Chest x-ray with no obvious pneumonia pneumothorax.  He is already had symptoms for couple days.  I think continue to manage this with fluids Tylenol ibuprofen.  Discharged in good condition.  Understands return precautions.  This chart was dictated  using voice recognition software.  Despite best efforts to proofread,  errors can occur which can change the documentation meaning.         Final Clinical Impression(s) / ED Diagnoses Final diagnoses:  Influenza A    Rx / DC Orders ED Discharge Orders     None         Virgina Norfolk, DO 06/06/23 1734

## 2023-06-06 NOTE — ED Triage Notes (Signed)
 Pt c/o severe cough, congestion, and unusual fatigue.  He has been fatigued for 1 month. Cough began 3 days ago .

## 2023-06-06 NOTE — ED Provider Notes (Signed)
 [facility]    CSN: [id number] Arrival date & time: 06/06/23  1221      History   Chief Complaint Chief Complaint  Patient presents with   Cough    HPI Patrick Nguyen is a 83 y.o. male.   Patient with history of CAD, CABG x 3, hypertension, and hyperlipidemia presents to urgent care for evaluation of cough and congestion that started 3 days ago.  Cough is mostly dry and nonproductive.  Reports intermittent shortness of breath and bilateral chest tightness associated with coughing.  Denies recent sick contacts with similar symptoms to his knowledge.  Former smoker, denies history of chronic respiratory problems.  He additionally reports worsening fatigue despite appropriate sleep hygiene for the last approximately 1 month.  History of CABG x 3 in 2019, he has not had a heart cath since.  States he usually gets approximately 11 hours of sleep per night but remains significantly fatigued throughout the day.  He had an appointment scheduled with his cardiologist to discuss this further last week that was canceled due to inclement weather.    Cough   Past Medical History:  Diagnosis Date   Arthritis    CAD (coronary artery disease) 05/13/2017   s/p CABG w/ / LIMA-LAD, SVG-OM, SVG-PDA   Dyspnea    w/ exertion    Elevated coronary artery calcium score    GERD (gastroesophageal reflux disease)    hx   Hepatitis    in   high  school     Hyperlipidemia    Hypertension    Rosacea     Patient Active Problem List   Diagnosis Date Noted   Dizziness 05/06/2021   Chest pain of uncertain etiology 09/05/2018   Coronary artery disease involving native coronary artery of native heart without angina pectoris 09/05/2018   Educated about COVID-19 virus infection 09/05/2018   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   DOE (dyspnea on exertion) 05/13/2017   Abnormal cardiovascular stress test 05/13/2017   High coronary artery calcium score 05/13/2017   Essential hypertension 05/13/2017    Dyslipidemia 05/13/2017    Past Surgical History:  Procedure Laterality Date   BACK SURGERY N/A    CARDIAC CATHETERIZATION     CORONARY ARTERY BYPASS GRAFT N/A 06/07/2017   Procedure: CORONARY ARTERY BYPASS GRAFTING (CABG) x three, using left internal mammary artery and right leg greater saphenous vein harvested endoscopically;  Surgeon: Kerin Perna, MD;  [facility];  Service: Open Heart Surgery;  Laterality: N/A;   CORONARY PRESSURE/FFR STUDY N/A 05/19/2017   Procedure: INTRAVASCULAR PRESSURE WIRE/FFR STUDY;  Surgeon: Lyn Records, MD;  Location: [facility] INVASIVE CV LAB;  Service: Cardiovascular;  Laterality: N/A;   LEFT HEART CATH AND CORONARY ANGIOGRAPHY N/A 05/19/2017   Procedure: LEFT HEART CATH AND CORONARY ANGIOGRAPHY;  Surgeon: Lyn Records, MD;  Location: [facility] INVASIVE CV LAB;  Service: Cardiovascular;  Laterality: N/A;   ROTATOR CUFF REPAIR Left    TEE WITHOUT CARDIOVERSION N/A 06/07/2017   Procedure: TRANSESOPHAGEAL ECHOCARDIOGRAM (TEE);  Surgeon: Donata Clay, Theron Arista, MD;  Location: [facility] OR;  Service: Open Heart Surgery;  Laterality: N/A;       Home Medications    Prior to Admission medications   Medication Sig Start Date End Date Taking? Authorizing Provider  albuterol (VENTOLIN HFA) 108 (90 Base) MCG/ACT inhaler Inhale 2 puffs into the lungs every 6 (six) hours as needed for wheezing or shortness of breath. Patient not taking: Reported on 04/21/2023 05/28/22   Virl Diamond  A, MD  aspirin EC 81 MG tablet Take 81 mg by mouth daily. Patient not taking: Reported on 04/21/2023    [provider]  azelastine (ASTELIN) 0.1 % nasal spray Place 2 sprays into both nostrils 2 (two) times daily. Use in each nostril as directed 04/21/23   Read Drivers, MD  Fluticasone-Umeclidin-Vilant (TRELEGY ELLIPTA) 100-62.5-25 MCG/ACT AEPB Inhale 1 puff into the lungs daily. Patient not taking: Reported on 04/21/2023 05/28/22   Virl Diamond A, MD  ipratropium (ATROVENT) 0.06 % nasal  spray Place 2 sprays into both nostrils 2 (two) times daily as needed for rhinitis. 04/21/23   Read Drivers, MD  metoprolol tartrate (LOPRESSOR) 25 MG tablet TAKE 1/2 TABLET BY MOUTH TWICE A DAY 05/12/23   Rollene Rotunda, MD  nitroGLYCERIN (NITROSTAT) 0.4 MG SL tablet Place 1 tablet (0.4 mg total) under the tongue every 5 (five) minutes as needed for chest pain. 03/27/18 05/07/22  Rollene Rotunda, MD  olmesartan (BENICAR) 5 MG tablet TAKE 1/2 TABLET BY MOUTH DAILY **PATIENT NEEDS AN APPOINTMENT** 03/02/23   Rollene Rotunda, MD  rosuvastatin (CRESTOR) 20 MG tablet Take 1 tablet (20 mg total) by mouth every evening. 07/20/17   Lovett Sox, MD  sodium chloride (OCEAN) 0.65 % SOLN nasal spray Place 2 sprays into both nostrils every 4 (four) hours as needed for congestion. 04/21/23   Read Drivers, MD    Family History Family History  Problem Relation Age of Onset   Heart disease Mother 65   Hypertension Mother    Cancer Father        Larynx   AAA (abdominal aortic aneurysm) Father     Social History Social History   Tobacco Use   Smoking status: Former    Current packs/day: 0.00    Types: Cigarettes    Quit date: 05/13/1986    Years since quitting: 37.0   Smokeless tobacco: Never  Vaping Use   Vaping status: Never Used  Substance Use Topics   Alcohol use: Yes    Alcohol/week: 3.0 standard drinks of alcohol    Types: 2 Cans of beer, 1 Glasses of wine per week    Comment: daily   Drug use: No     Allergies   Atorvastatin   Review of Systems Review of Systems  Respiratory:  Positive for cough.   Per HPI   Physical Exam Triage Vital Signs ED Triage Vitals  Encounter Vitals Group     BP 06/06/23 1259 139/65     Systolic BP Percentile --      Diastolic BP Percentile --      Pulse Rate 06/06/23 1255 73     Resp 06/06/23 1255 20     Temp 06/06/23 1255 98.8 F (37.1 C)     Temp Source 06/06/23 1255 Oral     SpO2 06/06/23 1255 91 %     Weight --      Height --       Head Circumference --      Peak Flow --      Pain Score 06/06/23 1301 0     Pain Loc --      Pain Education --      Exclude from Growth Chart --    No data found.  Updated Vital Signs BP 139/65 (BP Location: Right Arm)   Pulse 73   Temp 98.8 F (37.1 C) (Oral)   Resp 20   SpO2 91%   Visual Acuity Right Eye Distance:  Left Eye Distance:   Bilateral Distance:    Right Eye Near:   Left Eye Near:    Bilateral Near:     Physical Exam Vitals and nursing note reviewed.  Constitutional:      Appearance: He is not ill-appearing or toxic-appearing.  HENT:     Head: Normocephalic and atraumatic.     Right Ear: Hearing, tympanic membrane, ear canal and external ear normal.     Left Ear: Hearing, tympanic membrane, ear canal and external ear normal.     Nose: Congestion present.     Mouth/Throat:     Lips: Pink.     Mouth: Mucous membranes are moist. No injury or oral lesions.     Dentition: Normal dentition.     Tongue: No lesions.     Pharynx: Oropharynx is clear. Uvula midline. No pharyngeal swelling, oropharyngeal exudate, posterior oropharyngeal erythema, uvula swelling or postnasal drip.     Tonsils: No tonsillar exudate.  Eyes:     General: Lids are normal. Vision grossly intact. Gaze aligned appropriately.     Extraocular Movements: Extraocular movements intact.     Conjunctiva/sclera: Conjunctivae normal.  Neck:     Trachea: Trachea and phonation normal.  Cardiovascular:     Rate and Rhythm: Normal rate and regular rhythm.     Heart sounds: Normal heart sounds, S1 normal and S2 normal.  Pulmonary:     Effort: Pulmonary effort is normal. No respiratory distress.     Breath sounds: Normal breath sounds and air entry. No wheezing, rhonchi or rales.  Chest:     Chest wall: No tenderness.  Musculoskeletal:     Cervical back: Neck supple.     Right lower leg: No edema.     Left lower leg: No edema.  Lymphadenopathy:     Cervical: No cervical adenopathy.  Skin:     General: Skin is warm and dry.     Capillary Refill: Capillary refill takes less than 2 seconds.     Findings: No rash.  Neurological:     General: No focal deficit present.     Mental Status: He is alert and oriented to person, place, and time. Mental status is at baseline.     Cranial Nerves: No dysarthria or facial asymmetry.  Psychiatric:        Mood and Affect: Mood normal.        Speech: Speech normal.        Behavior: Behavior normal.        Thought Content: Thought content normal.        Judgment: Judgment normal.      UC Treatments / Results  Labs (all labs ordered are listed, but only abnormal results are displayed) Labs Reviewed - No data to display  EKG   Radiology No results found.  Procedures Procedures (including critical care time)  Medications Ordered in UC Medications - No data to display  Initial Impression / Assessment and Plan / UC Course  I have reviewed the triage vital signs and the nursing notes.  Pertinent labs & imaging results that were available during my care of the patient were reviewed by me and considered in my medical decision making (see chart for details).   1.  Chronic fatigue, history of CAD Recent worsening fatigue despite adequate sleep hygiene concerning for worsening cardiac condition. Though this may be caused by undiagnosed sleep apnea, I would like for patient to proceed to the nearest emergency room for further workup and evaluation  to obtain troponin level and rule out ACS. EKG shows normal sinus rhythm with right bundle branch block (baseline).  No ST/T wave changes observed on EKG today.  Normal rate.  Cough likely secondary to viral illness. Lungs clear.  Discussed clinical concerns/exam findings leading to recommendation for further workup in the ER setting and risks of deferring ER visit with patient/family. Patient/family express understanding and agreement with plan, discharged to ER via personal vehicle with  wife.  Final Clinical Impressions(s) / UC Diagnoses   Final diagnoses:  Chronic fatigue  History of CAD (coronary artery disease)  Acute cough   Discharge Instructions   None    ED Prescriptions   None    PDMP not reviewed this encounter.   Carlisle Beers, [place] 06/06/23 1402

## 2023-06-06 NOTE — ED Notes (Signed)
 Patient is being discharged from the Urgent Care and sent to the Emergency Department via POV . Per Rennis Chris, FNP, patient is in need of higher level of care due to Extreme fatigue with cardiac history . Patient is aware and verbalizes understanding of plan of care.  Vitals:   06/06/23 1255 06/06/23 1259  BP:  139/65  Pulse: 73   Resp: 20   Temp: 98.8 F (37.1 C)   SpO2: 91%

## 2023-06-06 NOTE — ED Triage Notes (Signed)
 Cough x 1 week feeling bad and he is tired  x 2 weeks

## 2023-06-12 NOTE — Progress Notes (Unsigned)
 Cardiology Office Note    Date:  06/13/2023  ID:  Patrick Nguyen, DOB [birth year], MRN [id number] PCP:  Emilio Aspen, MD  Cardiologist:  Rollene Rotunda, MD  Electrophysiologist:  None   Chief Complaint: Shortness of breath  History of Present Illness: .    Patrick Nguyen is a 83 y.o. male with visit-pertinent history of CAD s/p CABG, hyperlipidemia, hypertension and shortness of breath.  In 2019 patient underwent CABG with LIMA to LAD, SVG to OM, SVG to PDA.  Patient was last seen by Dr. Antoine Poche in 04/2022, patient noted ongoing shortness of breath and was referred to pulmonology for concerns for long-lasting COVID.  It was noted at the time of his CABG he had PFTs that showed obstructive disease with significant bronchodilator response.  He was started on inhaler therapy with improvement in symptoms.  On 05/30/2023 he notified the office of feeling increasingly tired and short of breath the last 2 months he also reported weight gain.  Denied any evidence of swelling.  On chart review on 2/24 he tested positive for flu A.  Today he presents regarding increased shortness of breath. He reports that he has been having increased dyspnea with exertion that started a few months ago. He denies chest pain, lower extremity edema, orthopnea or pnd. Patient notes he does have problems with nasal congestion, did see ENT and started some nasal sprays with some improvement in breathing.  He notes he currently has a residual cough from recent influenza infection.  Patient plays wind instruments and feel like he is becoming more short of breath with this and he previously did, notes that he feels similar to how he did prior to seeing pulmonologist last year.  Labwork independently reviewed: 06/06/2023: Hemoglobin 14.9, hematocrit 46.3, sodium 137, potassium 4.5, creatinine 0.90 ROS: .   Todayhe denies chest pain, lower extremity edema, fatigue, palpitations, melena, hematuria, hemoptysis, diaphoresis,  weakness, presyncope, syncope, orthopnea, and PND.  All other systems are reviewed and otherwise negative. Studies Reviewed: Marland Kitchen    EKG:   EKG Interpretation Date/Time:  Monday June 13 2023 10:57:11 EST Ventricular Rate:  54 PR Interval:  170 QRS Duration:  128 QT Interval:  484 QTC Calculation: 458 R Axis:   82  Text Interpretation: Sinus bradycardia with sinus arrhythmia Right bundle branch block No significant change since last tracing Confirmed by Reather Littler [phone]) on 06/13/2023 1:33:23 PM    CV Studies:  Cardiac Studies & Procedures   ______________________________________________________________________________________________ CARDIAC CATHETERIZATION  CARDIAC CATHETERIZATION 05/19/2017  Narrative  Severe three-vessel coronary disease with chronic total occlusion of the mid RCA which fills from left to right by collaterals via septal perforators.  The large second obtuse marginal branch contains 85% stenosis.  The small first obtuse marginal contains ostial 75% narrowing,  The ostial LAD contains 60-70% stenosis (with threshold significant FFR of 0.78-0.80), and the large first diagonal contains 60% mid vessel stenosis.  Distal LAD contains diffuse 70% narrowing.  Left main is normal.  Basal inferior hypokinesis with preserved LVEF of 65%.  Normal filling pressures with LVEDP 13 mmHg.  RECOMMENDATIONS:   Significant three-vessel coronary disease and includes chronic total occlusion of large RCA.  The ostial LAD has threshold but significant FFR.  Recommend multivessel coronary bypass grafting.  Will refer to TCTS.  Secondary risk factor modification, based upon guideline recommendations: LDL less than 70, tight blood pressure control 130/85 mmHg or less, screening glycemic control and A1c less than 7, ATC.  Findings Coronary Findings Diagnostic  Dominance: Right  Left Main Dist LM lesion is 70% stenosed.  Left Anterior Descending Mid LAD to Dist LAD lesion is 75%  stenosed.  First Diagonal Branch Ost 1st Diag lesion is 50% stenosed. Ost 1st Diag to 1st Diag lesion is 70% stenosed.  Left Circumflex  First Obtuse Marginal Branch Vessel is small in size. Ost 1st Mrg lesion is 70% stenosed.  Second Obtuse Marginal Branch Vessel is large in size. 2nd Mrg lesion is 85% stenosed.  Right Coronary Artery Prox RCA to Mid RCA lesion is 100% stenosed. Dist RCA lesion is 100% stenosed.  Right Posterior Descending Artery Collaterals RPDA filled by collaterals from 1st Sept.  Intervention  No interventions have been documented.       TEE  ECHO TEE 06/07/2017  Interpretation Summary  Left atrium: Cavity is mildly to moderately dilated.  Mitral valve: Trace regurgitation.  Right ventricle: Normal wall thickness and ejection fraction. Cavity is mildly dilated.        ______________________________________________________________________________________________        Current Reported Medications:.    Current Meds  Medication Sig   aspirin EC 81 MG tablet Take 81 mg by mouth daily.   azelastine (ASTELIN) 0.1 % nasal spray Place 2 sprays into both nostrils 2 (two) times daily. Use in each nostril as directed   cetirizine (ZYRTEC ALLERGY) 10 MG tablet Take 10 mg by mouth daily.   fluticasone (FLONASE) 50 MCG/ACT nasal spray Place 2 sprays into both nostrils 2 (two) times daily.   Fluticasone-Umeclidin-Vilant (TRELEGY ELLIPTA) 100-62.5-25 MCG/ACT AEPB Inhale 1 puff into the lungs daily.   ipratropium (ATROVENT) 0.06 % nasal spray Place 2 sprays into both nostrils 2 (two) times daily as needed for rhinitis.   metoprolol tartrate (LOPRESSOR) 25 MG tablet TAKE 1/2 TABLET BY MOUTH TWICE A DAY   montelukast (SINGULAIR) 10 MG tablet Take 10 mg by mouth daily.   olmesartan (BENICAR) 5 MG tablet TAKE 1/2 TABLET BY MOUTH DAILY **PATIENT NEEDS AN APPOINTMENT**   rosuvastatin (CRESTOR) 20 MG tablet Take 1 tablet (20 mg total) by mouth every  evening.   sodium chloride (OCEAN) 0.65 % SOLN nasal spray Place 2 sprays into both nostrils every 4 (four) hours as needed for congestion.   Physical Exam:    VS:  BP 134/62 (BP Location: Left Arm, Patient Position: Sitting)   Pulse (!) 54   Ht 5\' 10"  (1.778 m)   Wt 172 lb 8 oz (78.2 kg)   SpO2 93%   BMI 24.75 kg/m    Wt Readings from Last 3 Encounters:  06/13/23 172 lb 8 oz (78.2 kg)  06/06/23 172 lb (78 kg)  04/21/23 169 lb (76.7 kg)    GEN: Well nourished, well developed in no acute distress NECK: No JVD; No carotid bruits CARDIAC: RRR, no murmurs, rubs, gallops RESPIRATORY:  Clear to auscultation without rales, wheezing or rhonchi  ABDOMEN: Soft, non-tender, non-distended EXTREMITIES:  No edema; No acute deformity   Asessement and Plan:.    SOB/prior tobacco use: Patient notes history of some shortness of breath following his bypass surgery up until last year.  He has previously seen by pulmonary and started on Trelegy and as needed albuterol.  Today patient reports increased dyspnea with exertion, he is unsure if this is related to long-haul COVID and previous pneumonia.  He does note this feels similar to when he first started seeing pulmonology.  He is no longer using his inhalers.  He does note that he has problems with  nasal congestion and postnasal drip.  He has been seen by ENT for this and started on some nasal sprays, he notes some improvement in his breathing with this.  Patient denies any lower extremity edema, orthopnea or PND.  He denies any snoring.  Will check echocardiogram, also recommended that patient followed up with his pulmonologist. Reviewed ED precautions.  Recent lab work was overall reassuring.  CAD: S/p CABG x 3 in 2019.  Today he reports increased dyspnea with exertion.  He denies any chest pain with exertion or at rest.  Will check echocardiogram as noted below, pending findings consider ischemic evaluation on follow-up. Heart healthy diet and regular  cardiovascular exercise encouraged.  Reviewed ED precautions. Continue aspirin 81 mg daily, metoprolol tartrate 12.5 mg twice daily, olmesartan 2.5 mg daily and Crestor 20 mg daily.  Dyslipidemia: Last lipid profile on 09/01/2022 indicated total cholesterol 130, HDL 65, triglycerides 125 and LDL 44.  Continue Crestor 20 mg daily.  HTN: Blood pressure today 1 3462.  Continue metoprolol tartrate 12.5 mg twice daily and olmesartan 2.5 mg daily.    Disposition: F/u with Reather Littler, NP in six weeks.   Signed, Rip Harbour, NP

## 2023-06-13 ENCOUNTER — Encounter: Payer: Self-pay | Admitting: Cardiology

## 2023-06-13 ENCOUNTER — Ambulatory Visit: Payer: Medicare Other | Attending: Cardiology | Admitting: Cardiology

## 2023-06-13 VITALS — BP 134/62 | HR 54 | Ht 70.0 in | Wt 172.5 lb

## 2023-06-13 DIAGNOSIS — I10 Essential (primary) hypertension: Secondary | ICD-10-CM | POA: Diagnosis not present

## 2023-06-13 DIAGNOSIS — R0602 Shortness of breath: Secondary | ICD-10-CM | POA: Diagnosis not present

## 2023-06-13 DIAGNOSIS — E785 Hyperlipidemia, unspecified: Secondary | ICD-10-CM

## 2023-06-13 DIAGNOSIS — I2510 Atherosclerotic heart disease of native coronary artery without angina pectoris: Secondary | ICD-10-CM | POA: Diagnosis not present

## 2023-06-13 MED ORDER — NITROGLYCERIN 0.4 MG SL SUBL
0.4000 mg | SUBLINGUAL_TABLET | SUBLINGUAL | 3 refills | Status: AC | PRN
Start: 2023-06-13 — End: 2024-02-06

## 2023-06-13 NOTE — Patient Instructions (Addendum)
 Medication Instructions:  No medication changes were made during today's visit  *If you need a refill on your cardiac medications before your next appointment, please call your pharmacy*   Lab Work: No labs were ordered during today's visit.  If you have labs (blood work) drawn today and your tests are completely normal, you will receive your results only by: MyChart Message (if you have MyChart) OR A paper copy in the mail If you have any lab test that is abnormal or we need to change your treatment, we will call you to review the results.   Testing/Procedures: Your physician has requested that you have an echocardiogram. Echocardiography is a painless test that uses sound waves to create images of your heart. It provides your doctor with information about the size and shape of your heart and how well your heart's chambers and valves are working. This procedure takes approximately one hour. There are no restrictions for this procedure. Please do NOT wear cologne, perfume, aftershave, or lotions (deodorant is allowed). Please arrive 15 minutes prior to your appointment time.  Please note: We ask at that you not bring children with you during ultrasound (echo/ vascular) testing. Due to room size and safety concerns, children are not allowed in the ultrasound rooms during exams. Our front office staff cannot provide observation of children in our lobby area while testing is being conducted. An adult accompanying a patient to their appointment will only be allowed in the ultrasound room at the discretion of the ultrasound technician under special circumstances. We apologize for any inconvenience.    Follow-Up: At [facility], you and your health needs are our priority.  As part of our continuing mission to provide you with exceptional heart care, we have created designated Provider Care Teams.  These Care Teams include your primary Cardiologist (physician) and Advanced Practice Providers  (APPs -  Physician Assistants and Nurse Practitioners) who all work together to provide you with the care you need, when you need it.  We recommend signing up for the patient portal called "MyChart".  Sign up information is provided on this After Visit Summary.  MyChart is used to connect with patients for Virtual Visits (Telemedicine).  Patients are able to view lab/test results, encounter notes, upcoming appointments, etc.  Non-urgent messages can be sent to your provider as well.   To learn more about what you can do with MyChart, go to [email or web address].    Your next appointment:   6-8 week(s)  Provider:   Reather Littler, NP        Other Instructions Thank you for choosing [facility]!             1st Floor: - Lobby - Registration  - Pharmacy  - Lab - Cafe   2nd Floor: - PV Lab - Diagnostic Testing (echo, CT, nuclear med)   3rd Floor: - Vacant   4th Floor: - TCTS (cardiothoracic surgery) - AFib Clinic - Structural Heart Clinic - Vascular Surgery  - Vascular Ultrasound   5th Floor: - HeartCare Cardiology (general and EP) - Clinical Pharmacy for coumadin, hypertension, lipid, weight-loss medications, and med management appointments      Valet parking services will be available as well.

## 2023-06-22 DIAGNOSIS — J449 Chronic obstructive pulmonary disease, unspecified: Secondary | ICD-10-CM | POA: Diagnosis not present

## 2023-06-22 DIAGNOSIS — Z79899 Other long term (current) drug therapy: Secondary | ICD-10-CM | POA: Diagnosis not present

## 2023-06-22 DIAGNOSIS — E538 Deficiency of other specified B group vitamins: Secondary | ICD-10-CM | POA: Diagnosis not present

## 2023-06-22 DIAGNOSIS — E559 Vitamin D deficiency, unspecified: Secondary | ICD-10-CM | POA: Diagnosis not present

## 2023-06-22 DIAGNOSIS — I10 Essential (primary) hypertension: Secondary | ICD-10-CM | POA: Diagnosis not present

## 2023-06-22 DIAGNOSIS — R55 Syncope and collapse: Secondary | ICD-10-CM | POA: Diagnosis not present

## 2023-06-22 DIAGNOSIS — T7840XA Allergy, unspecified, initial encounter: Secondary | ICD-10-CM | POA: Diagnosis not present

## 2023-06-22 DIAGNOSIS — I25810 Atherosclerosis of coronary artery bypass graft(s) without angina pectoris: Secondary | ICD-10-CM | POA: Diagnosis not present

## 2023-06-22 DIAGNOSIS — E785 Hyperlipidemia, unspecified: Secondary | ICD-10-CM | POA: Diagnosis not present

## 2023-06-22 DIAGNOSIS — K52832 Lymphocytic colitis: Secondary | ICD-10-CM | POA: Diagnosis not present

## 2023-06-22 DIAGNOSIS — G9331 Postviral fatigue syndrome: Secondary | ICD-10-CM | POA: Diagnosis not present

## 2023-07-01 DIAGNOSIS — Z961 Presence of intraocular lens: Secondary | ICD-10-CM | POA: Diagnosis not present

## 2023-07-01 DIAGNOSIS — H52203 Unspecified astigmatism, bilateral: Secondary | ICD-10-CM | POA: Diagnosis not present

## 2023-07-01 DIAGNOSIS — H01005 Unspecified blepharitis left lower eyelid: Secondary | ICD-10-CM | POA: Diagnosis not present

## 2023-07-01 DIAGNOSIS — H01002 Unspecified blepharitis right lower eyelid: Secondary | ICD-10-CM | POA: Diagnosis not present

## 2023-07-05 ENCOUNTER — Ambulatory Visit (HOSPITAL_COMMUNITY): Attending: Cardiology

## 2023-07-05 ENCOUNTER — Telehealth: Payer: Self-pay

## 2023-07-05 DIAGNOSIS — R0602 Shortness of breath: Secondary | ICD-10-CM | POA: Insufficient documentation

## 2023-07-05 DIAGNOSIS — I10 Essential (primary) hypertension: Secondary | ICD-10-CM | POA: Insufficient documentation

## 2023-07-05 DIAGNOSIS — I2510 Atherosclerotic heart disease of native coronary artery without angina pectoris: Secondary | ICD-10-CM | POA: Diagnosis not present

## 2023-07-05 LAB — ECHOCARDIOGRAM COMPLETE
Area-P 1/2: 2.91 cm2
S' Lateral: 2.6 cm

## 2023-07-05 NOTE — Telephone Encounter (Signed)
-----   Message from [facility] sent at 07/05/2023 10:46 AM EDT ----- Please let Patrick Nguyen know that his echocardiogram showed normal heart squeeze and function. There were no significant valvular abnormalities. We will plan to discuss in further detail on follow up, overall good results!

## 2023-07-05 NOTE — Telephone Encounter (Signed)
 Called patient advised of below they verbalized understanding, No questions at this time.

## 2023-07-11 DIAGNOSIS — S41101A Unspecified open wound of right upper arm, initial encounter: Secondary | ICD-10-CM | POA: Diagnosis not present

## 2023-07-11 DIAGNOSIS — Z23 Encounter for immunization: Secondary | ICD-10-CM | POA: Diagnosis not present

## 2023-07-20 ENCOUNTER — Encounter (INDEPENDENT_AMBULATORY_CARE_PROVIDER_SITE_OTHER): Payer: Self-pay

## 2023-07-20 ENCOUNTER — Ambulatory Visit (INDEPENDENT_AMBULATORY_CARE_PROVIDER_SITE_OTHER): Payer: Medicare Other | Admitting: Otolaryngology

## 2023-07-20 VITALS — BP 139/53 | HR 54 | Ht 70.0 in | Wt 170.0 lb

## 2023-07-20 DIAGNOSIS — R0982 Postnasal drip: Secondary | ICD-10-CM | POA: Diagnosis not present

## 2023-07-20 DIAGNOSIS — J3489 Other specified disorders of nose and nasal sinuses: Secondary | ICD-10-CM

## 2023-07-20 DIAGNOSIS — J342 Deviated nasal septum: Secondary | ICD-10-CM | POA: Diagnosis not present

## 2023-07-20 DIAGNOSIS — R490 Dysphonia: Secondary | ICD-10-CM | POA: Diagnosis not present

## 2023-07-20 DIAGNOSIS — R0602 Shortness of breath: Secondary | ICD-10-CM

## 2023-07-20 DIAGNOSIS — J343 Hypertrophy of nasal turbinates: Secondary | ICD-10-CM | POA: Diagnosis not present

## 2023-07-20 DIAGNOSIS — R0981 Nasal congestion: Secondary | ICD-10-CM

## 2023-07-20 MED ORDER — IPRATROPIUM BROMIDE 0.06 % NA SOLN: 2.0000 | Freq: Two times a day (BID) | NASAL | 12 refills | Status: DC | PRN

## 2023-07-20 MED ORDER — AZELASTINE HCL 0.1 % NA SOLN
2.0000 | Freq: Two times a day (BID) | NASAL | 12 refills | Status: AC
Start: 2023-07-20 — End: ?

## 2023-07-20 NOTE — Progress Notes (Signed)
 Dear Dr. Orson Aloe, Here is my assessment for our mutual patient, Purcell Jungbluth. Thank you for allowing me the opportunity to care for your patient. Please do not hesitate to contact me should you have any other questions. Sincerely, Dr. Jovita Kussmaul  Otolaryngology Clinic Note  HISTORY: Patrick Nguyen is a 83 y.o. male kindly referred by Dr. Orson Aloe for evaluation of allergic rhinitis and post nasal drip.   Initial visit (04/2023): He reports that his primary complaint is post nasal drip and rhinitis. He reports that it has been ongoing for about a year. He also reports some intermittent congestion in the nose, but denies facial pressure/pain, hyposmia, or discolored drainage. Mucoid rhinorrhea, not thin or salty/metallic tasting. No dripping with head tilt or valsalva. He thinks that things are better some in certain environments (car), certain rooms in the house, cold is worse. Better in wintertime, worse in summer. No severity changes with barometric pressure. Occassional eye itching, but otherwise denies typical AR symptoms. No antecedent event including trauma or infections, but he did get COVID and PNA over last year. Associated symptoms including some dysphonia. He denies frequent sinus infections requiring treatment. He has tried singulair, atrovent, flonase, astelin, PO antihistamine, and Inhalers. He reports intermittent shortness of breath with activity, no problems with dyspnea at rest. No swallowing difficulties. Atrovent helped some but did not last a long time. Allergy testing has been done (summer 2024) - and he reports allergies to grass, pet dander. No previous sinonasal surgery. No cough.  No reflux symptoms.  He is currently using flonase (once daily), zyrtec, montelukast.  -------------------------------------------------------- 07/20/2023 He reports that the sprays are helping a fair amount with his symptoms - PND is better during the day. Sprays are definitely helping since  when he ran out, he had immediate return of symptoms He is still having some issues with symptoms during the day when he first gets up; sleeping no problem. No infection, no frequent sinus infections.  He reports he saw his cardiology, who is doing workup for his SOB and plans to refer him to pulm after this. Still noting congestion.  -------------------------------------------------------- AP/AC: ASA 83  Tobacco: quit 1990  PMHx: CAD s/p CABG 2019, SOB (with activity), HTN  RADIOGRAPHIC EVALUATION AND INDEPENDENT REVIEW OF OTHER RECORDS:: Rwanda Miller PA-C [facility]) - 02/28/2023: Long-standing issue for rhinitis, cough; worsening drainage, rhinorrhea, clear cough 2-3 months; SOB; Prior eval by cards, pulm, AR; Dx: AR; Rx: Montelukast - already on atrovent, zyrtec, flonase. Dr. Irena Cords (11/2022 - Allergies): Similar Sx; intermittent flares; Dx: Dev Septum, AR; Rx: Astelin and Flonase; prior allergy to pet dander and grass Dr. Wynona Neat (10/19/2022) Pulm - SOB much improved; thinks mold may have been contributor; flonase for nasal congestion; since improved, f/u PRN for SOB Labs: 04/15/2021 CBC and CMP: WBC 6, Eos 200, no sig Kidney disease PFTs 2019 independently reviewed: reversible obstructive and restrictive dz  Past Medical History:  Diagnosis Date   Arthritis    CAD (coronary artery disease) 05/13/2017   s/p CABG w/ / LIMA-LAD, SVG-OM, SVG-PDA   Dyspnea    w/ exertion    Elevated coronary artery calcium score    GERD (gastroesophageal reflux disease)    hx   Hepatitis    in   high  school     Hyperlipidemia    Hypertension    Rosacea    Past Surgical History:  Procedure Laterality Date   BACK SURGERY N/A    CARDIAC CATHETERIZATION     CORONARY  ARTERY BYPASS GRAFT N/A 06/07/2017   Procedure: CORONARY ARTERY BYPASS GRAFTING (CABG) x three, using left internal mammary artery and right leg greater saphenous vein harvested endoscopically;  Surgeon: Kerin Perna, MD;   Location: [facility];  Service: Open Heart Surgery;  Laterality: N/A;   CORONARY PRESSURE/FFR STUDY N/A 05/19/2017   Procedure: INTRAVASCULAR PRESSURE WIRE/FFR STUDY;  Surgeon: Lyn Records, MD;  Location: [facility] INVASIVE CV LAB;  Service: Cardiovascular;  Laterality: N/A;   LEFT HEART CATH AND CORONARY ANGIOGRAPHY N/A 05/19/2017   Procedure: LEFT HEART CATH AND CORONARY ANGIOGRAPHY;  Surgeon: Lyn Records, MD;  Location: [facility] INVASIVE CV LAB;  Service: Cardiovascular;  Laterality: N/A;   ROTATOR CUFF REPAIR Left    TEE WITHOUT CARDIOVERSION N/A 06/07/2017   Procedure: TRANSESOPHAGEAL ECHOCARDIOGRAM (TEE);  Surgeon: Donata Clay, Theron Arista, MD;  Location: [facility] OR;  Service: Open Heart Surgery;  Laterality: N/A;   Family History  Problem Relation Age of Onset   Heart disease Mother 48   Hypertension Mother    Cancer Father        Larynx   AAA (abdominal aortic aneurysm) Father    Social History   Tobacco Use   Smoking status: Former    Current packs/day: 0.00    Types: Cigarettes    Quit date: 05/13/1986    Years since quitting: 37.2   Smokeless tobacco: Never  Substance Use Topics   Alcohol use: Yes    Alcohol/week: 3.0 standard drinks of alcohol    Types: 2 Cans of beer, 1 Glasses of wine per week    Comment: daily   Allergies  Allergen Reactions   Atorvastatin Other (See Comments)    Made pt not like himself   Current Outpatient Medications  Medication Sig Dispense Refill   aspirin EC 81 MG tablet Take 81 mg by mouth daily.     cetirizine (ZYRTEC ALLERGY) 10 MG tablet Take 10 mg by mouth daily.     fluticasone (FLONASE) 50 MCG/ACT nasal spray Place 2 sprays into both nostrils 2 (two) times daily.     Fluticasone-Umeclidin-Vilant (TRELEGY ELLIPTA) 100-62.5-25 MCG/ACT AEPB Inhale 1 puff into the lungs daily. 60 each 3   metoprolol tartrate (LOPRESSOR) 25 MG tablet TAKE 1/2 TABLET BY MOUTH TWICE A DAY 90 tablet 3   montelukast (SINGULAIR) 10 MG tablet Take 10 mg by mouth daily.      nitroGLYCERIN (NITROSTAT) 0.4 MG SL tablet Place 1 tablet (0.4 mg total) under the tongue every 5 (five) minutes as needed for chest pain. 25 tablet 3   olmesartan (BENICAR) 5 MG tablet TAKE 1/2 TABLET BY MOUTH DAILY **PATIENT NEEDS AN APPOINTMENT** 45 tablet 0   rosuvastatin (CRESTOR) 20 MG tablet Take 1 tablet (20 mg total) by mouth every evening. 30 tablet 2   sodium chloride (OCEAN) 0.65 % SOLN nasal spray Place 2 sprays into both nostrils every 4 (four) hours as needed for congestion. 15 mL 0   albuterol (VENTOLIN HFA) 108 (90 Base) MCG/ACT inhaler Inhale 2 puffs into the lungs every 6 (six) hours as needed for wheezing or shortness of breath. (Patient not taking: Reported on 07/20/2023) 8 g 3   azelastine (ASTELIN) 0.1 % nasal spray Place 2 sprays into both nostrils 2 (two) times daily. Use in each nostril as directed 30 mL 12   ipratropium (ATROVENT) 0.06 % nasal spray Place 2 sprays into both nostrils 2 (two) times daily as needed for rhinitis. 15 mL 12   No current facility-administered medications for  this visit.   BP (!) 139/53 (BP Location: Left Arm, Patient Position: Sitting, Cuff Size: Large)   Pulse (!) 54   Ht 5\' 10"  (1.778 m)   Wt 170 lb (77.1 kg)   SpO2 94%   BMI 24.39 kg/m   PHYSICAL EXAM:  BP (!) 139/53 (BP Location: Left Arm, Patient Position: Sitting, Cuff Size: Large)   Pulse (!) 54   Ht 5\' 10"  (1.778 m)   Wt 170 lb (77.1 kg)   SpO2 94%   BMI 24.39 kg/m   Salient findings:  CN II-XII intact Nose: Anterior rhinoscopy reveals modest right septal deviation, much improved turbinate hypertrophy; Nasal endoscopy was indicated to better evaluate the nose and paranasal sinuses, given the patient's persistent symptoms, history and exam findings, and is detailed below. No lesions of oral cavity/oropharynx No obviously palpable neck masses/lymphadenopathy/thyromegaly No respiratory distress or stridor; voice quality class 1  PROCEDURE: Diagnostic Nasal  Endoscopy Pre-procedure diagnosis: Post nasal drip, persistent nasal congestion Post-procedure diagnosis: same Indication: See pre-procedure diagnosis and physical exam above Complications: None apparent EBL: 0 mL Anesthesia: Lidocaine 4% and topical decongestant was topically sprayed in each nasal cavity  Description of Procedure:  Patient was identified. A rigid 30 degree endoscope was utilized to evaluate the sinonasal cavities, mucosa, sinus ostia and turbinates and septum.  Overall, signs of mucosal inflammation are not noted.  No mucopurulence, polyps, or masses noted. Turbinate hypertrophy has resolved. Right Middle meatus: clear Right SE Recess: clear Left MM: clear Left SE Recess: clear  Photodocumentation was obtained.  CPT CODE -- 10272 - Mod 25  ASSESSMENT:  Mr. Lesinski is a 83 yo M with h/o CAD s/p CABG and some SOB now with:  1. Hypertrophy of both inferior nasal turbinates   2. Nasal congestion   3. Nasal septal deviation   4. Post-nasal drip   5. Shortness of breath   6. Rhinorrhea   7. Dysphonia    His symptoms are primarily related to rhinorrhea and PND for which he has to clear his nose. It is mucoid, and bilateral, associated with certain environments and places. No other significant triggers and he denies typical AR symptoms. He has seen allergy before and tried various medications, none of which have provided fair benefit except for perhaps atrovent. After maximal medical management, he does report that his symptoms are much improved with some symptoms only when he wakes up in morning.  He continues to have some intermittent SOB and has been eval'd by Pulm with prior PFTs with reversible obstructive defect. We discussed that given improvement in turbinate hypertrophy and prior eval, do not think SOB is related to his proximal airway currently. Would advise pulm follow up  PLAN: We've discussed issues and options today. Given improvement, will continue medical  management:  1) Daily Nasal Rinses 2) Continue BID astelin 3) Continue BID atrovent PRN for dripping - Given persistent SOB, would recommend referral to pulm -- I offered to do this, but he reports his cardiologist is planning to do this. Advised him to call if he needs a referral  See below regarding exact medications prescribed this encounter including dosages and route: Meds ordered this encounter  Medications   azelastine (ASTELIN) 0.1 % nasal spray    Sig: Place 2 sprays into both nostrils 2 (two) times daily. Use in each nostril as directed    Dispense:  30 mL    Refill:  12   ipratropium (ATROVENT) 0.06 % nasal spray    Sig:  Place 2 sprays into both nostrils 2 (two) times daily as needed for rhinitis.    Dispense:  15 mL    Refill:  12     Thank you for allowing me the opportunity to care for your patient. Please do not hesitate to contact me should you have any other questions.  Sincerely, Jovita Kussmaul, MD Otolaryngologist (ENT), [facility] Phone: [phone] Fax: [phone]  I have personally spent 32 minutes involved in face-to-face and non-face-to-face activities for this patient on the day of the visit.  Professional time spent excludes any procedures performed but includes the following activities, in addition to those noted in the documentation: preparing to see the patient (review of outside documentation and results), performing a medically appropriate examination, counseling, ordering medications (astelin, atrovent refills), documenting in the electronic health record   07/20/2023, 12:48 PM

## 2023-07-28 NOTE — Progress Notes (Signed)
 Cardiology Office Note    Date:  07/29/2023  ID:  Patrick Nguyen, DOB 09/27/1940, MRN [id number] PCP:  Charlott Dorn LABOR, MD  Cardiologist:  Lynwood Schilling, MD  Electrophysiologist:  None   Chief Complaint: Follow up for shortness of breath   History of Present Illness: .    Patrick Nguyen is a 83 y.o. male with visit-pertinent history of CAD s/p CABG, hyperlipidemia, hypertension and shortness of breath.  In 2019 patient underwent CABG with LIMA to LAD, SVG to OM, SVG to PDA.  Patient was last seen by Dr. 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  It was noted at the time of his CABG he had PFTs that showed obstructive disease with significant bronchodilator response.  He was started on inhaler therapy with improvement in symptoms.  On 05/30/2023 he notified the office of feeling increasingly tired and short of breath the last 2 months he also reported weight gain.  Denied any evidence of swelling.  On chart review on 2/24 he tested positive for flu A.  Patient was seen in clinic on 06/13/2023 regarding increased shortness of breath.  He noted he had been having increased dyspnea with exertion that started a few months prior.  He denies chest pain, lower extremity edema, orthopnea or PND.  He noted he did have problems with nasal congestion, evaluated by ENT and noted some improvement in breathing.  Patient was also previously followed by pulmonary last year.  Echocardiogram on 07/05/2023 indicated LVEF of 65 to 70%, no RWMA, diastolic parameters were normal, RV systolic size and function was normal, trivial mitral valve regurgitation.  Today he presents for follow-up.  He reports that he is doing well overall.  He continues to note intermittent shortness of breath with exertion.  He notes that the shortness of breath waxes and wanes. He continues to struggle with post nasal drip when first getting up in  the morning or after a hot shower, followed by ENT.  He has not yet seen his pulmonologist for evaluation of shortness of breath.  Patient denies any chest pain, lower extremity edema, orthopnea or PND.  He denies any presyncope or syncope.  Reviewed patient's echocardiogram today in detail.  Labwork independently reviewed: 06/02/2023: Sodium 137, potassium 4.5, creatinine 0.90, hemoglobin 14.9, hematocrit 46.3 ROS: .   Today he denies chest pain, lower extremity edema, fatigue, palpitations, melena, hematuria, hemoptysis, diaphoresis, weakness, presyncope, syncope, orthopnea, and PND.  All other systems are reviewed and otherwise negative. Studies Reviewed: SABRA   EKG:  EKG is not ordered today.  CV Studies: Cardiac studies reviewed are outlined and summarized above. Otherwise please see EMR for full report. Cardiac Studies & Procedures   ______________________________________________________________________________________________ CARDIAC CATHETERIZATION  CARDIAC CATHETERIZATION 05/19/2017  Conclusion  Severe three-vessel coronary disease with chronic total occlusion of the mid RCA which fills from left to right by collaterals via septal perforators.  The large second obtuse marginal branch contains 85% stenosis.  The small first obtuse marginal contains ostial 75% narrowing,  The ostial LAD contains 60-70% stenosis (with threshold significant FFR of 0.78-0.80), and the large first diagonal contains 60% mid vessel stenosis.  Distal LAD contains diffuse 70% narrowing.  Left main is normal.  Basal inferior hypokinesis with preserved LVEF of 65%.  Normal filling pressures with LVEDP 13 mmHg.  RECOMMENDATIONS:   Significant three-vessel coronary disease and includes chronic total occlusion of large RCA.  The ostial LAD has threshold  but significant FFR.  Recommend multivessel coronary bypass grafting.  Will refer to TCTS.  Secondary risk factor modification, based upon guideline  recommendations: LDL less than 70, tight blood pressure control 130/85 mmHg or less, screening glycemic control and A1c less than 7, ATC.  Findings Coronary Findings Diagnostic  Dominance: Right  Left Main Dist LM lesion is 70% stenosed.  Left Anterior Descending Mid LAD to Dist LAD lesion is 75% stenosed.  First Diagonal Branch Ost 1st Diag lesion is 50% stenosed. Ost 1st Diag to 1st Diag lesion is 70% stenosed.  Left Circumflex  First Obtuse Marginal Branch Vessel is small in size. Ost 1st Mrg lesion is 70% stenosed.  Second Obtuse Marginal Branch Vessel is large in size. 2nd Mrg lesion is 85% stenosed.  Right Coronary Artery Prox RCA to Mid RCA lesion is 100% stenosed. Dist RCA lesion is 100% stenosed.  Right Posterior Descending Artery Collaterals RPDA filled by collaterals from 1st Sept.  Intervention  No interventions have been documented.     ECHOCARDIOGRAM  ECHOCARDIOGRAM COMPLETE 07/05/2023  Narrative ECHOCARDIOGRAM REPORT    Patient Name:   Patrick Nguyen Date of Exam: 07/05/2023 Medical Rec #:  [id number]      Height:       70.0 in Accession #:    [id number]     Weight:       172.5 lb Date of Birth:  10-18-1940      BSA:          1.960 m Patient Age:    83 years       BP:           134/62 mmHg Patient Gender: M              HR:           52 bpm. Exam Location:  [address]  Procedure: 2D Echo, Color Doppler, Cardiac Doppler, 3D Echo and Strain Analysis (Both Spectral and Color Flow Doppler were utilized during procedure).  Indications:    R06.02 SOB  History:        Patient has prior history of Echocardiogram examinations, most recent 06/07/2017. CAD, Prior CABG; Risk Factors:Hypertension and Dyslipidemia.  Sonographer:    Augustin Seals RDCS Referring Phys: [id number] Patrick Nguyen  IMPRESSIONS   1. Left ventricular ejection fraction, by estimation, is 65 to 70%. Left ventricular ejection fraction by 3D volume is 68 %. The left  ventricle has normal function. The left ventricle has no regional wall motion abnormalities. Left ventricular diastolic parameters were normal. The average left ventricular global longitudinal strain is -22.8 %. The global longitudinal strain is normal. 2. Right ventricular systolic function is normal. The right ventricular size is normal. Tricuspid regurgitation signal is inadequate for assessing PA pressure. 3. The mitral valve is normal in structure. Trivial mitral valve regurgitation. No evidence of mitral stenosis. 4. The aortic valve is normal in structure. Aortic valve regurgitation is not visualized. No aortic stenosis is present. 5. The inferior vena cava is normal in size with greater than 50% respiratory variability, suggesting right atrial pressure of 3 mmHg.  FINDINGS Left Ventricle: Left ventricular ejection fraction, by estimation, is 65 to 70%. Left ventricular ejection fraction by 3D volume is 68 %. The left ventricle has normal function. The left ventricle has no regional wall motion abnormalities. The average left ventricular global longitudinal strain is -22.8 %. Strain was performed and the global longitudinal strain is normal. The left ventricular internal cavity size was normal in size.  There is no left ventricular hypertrophy. Left ventricular diastolic parameters were normal. Normal left ventricular filling pressure.  Right Ventricle: The right ventricular size is normal. No increase in right ventricular wall thickness. Right ventricular systolic function is normal. Tricuspid regurgitation signal is inadequate for assessing PA pressure.  Left Atrium: Left atrial size was normal in size.  Right Atrium: Right atrial size was normal in size.  Pericardium: There is no evidence of pericardial effusion.  Mitral Valve: The mitral valve is normal in structure. Trivial mitral valve regurgitation. No evidence of mitral valve stenosis.  Tricuspid Valve: The tricuspid valve is  normal in structure. Tricuspid valve regurgitation is trivial. No evidence of tricuspid stenosis.  Aortic Valve: The aortic valve is normal in structure. Aortic valve regurgitation is not visualized. No aortic stenosis is present.  Pulmonic Valve: The pulmonic valve was normal in structure. Pulmonic valve regurgitation is trivial. No evidence of pulmonic stenosis.  Aorta: The aortic root is normal in size and structure.  Venous: The inferior vena cava is normal in size with greater than 50% respiratory variability, suggesting right atrial pressure of 3 mmHg.  IAS/Shunts: No atrial level shunt detected by color flow Doppler.  Additional Comments: 3D was performed not requiring image post processing on an independent workstation and was normal.   LEFT VENTRICLE PLAX 2D LVIDd:         4.30 cm         Diastology LVIDs:         2.60 cm         LV e' medial:    8.11 cm/s LV PW:         1.00 cm         LV E/e' medial:  11.5 LV IVS:        1.00 cm         LV e' lateral:   8.11 cm/s LVOT diam:     2.00 cm         LV E/e' lateral: 11.5 LV SV:         92 LV SV Index:   47              2D Longitudinal LVOT Area:     3.14 cm        Strain 2D Strain GLS   -22.7 % (A4C): 2D Strain GLS   -21.6 % (A3C): 2D Strain GLS   -24.1 % (A2C): 2D Strain GLS   -22.8 % Avg:  3D Volume EF LV 3D EF:    Left ventricul ar ejection fraction by 3D volume is 68 %.  3D Volume EF: 3D EF:        68 % LV EDV:       97 ml LV ESV:       31 ml LV SV:        66 ml  RIGHT VENTRICLE             IVC RV Basal diam:  3.80 cm     IVC diam: 1.80 cm RV Mid diam:    3.00 cm RV S prime:     11.00 cm/s TAPSE (M-mode): 1.8 cm  LEFT ATRIUM             Index        RIGHT ATRIUM           Index LA diam:        3.10 cm 1.58 cm/m   RA Area:  20.70 cm LA Vol (A2C):   40.7 ml 20.76 ml/m  RA Volume:   62.70 ml  31.99 ml/m LA Vol (A4C):   24.8 ml 12.65 ml/m LA Biplane Vol: 32.1 ml 16.38 ml/m AORTIC VALVE LVOT  Vmax:   135.00 cm/s LVOT Vmean:  67.900 cm/s LVOT VTI:    0.293 m  AORTA Ao Root diam: 3.20 cm  MITRAL VALVE MV Area (PHT): 2.91 cm    SHUNTS MV Decel Time: 261 msec    Systemic VTI:  0.29 m MV E velocity: 93.30 cm/s  Systemic Diam: 2.00 cm MV A velocity: 98.00 cm/s MV E/A ratio:  0.95  Patrick Bihari MD Electronically signed by Patrick Bihari MD Signature Date/Time: 07/05/2023/9:19:26 AM    Final   TEE  ECHO TEE 06/07/2017  Interpretation Summary  Left atrium: Cavity is mildly to moderately dilated.  Mitral valve: Trace regurgitation.  Right ventricle: Normal wall thickness and ejection fraction. Cavity is mildly dilated.        ______________________________________________________________________________________________       Current Reported Medications:.    Current Meds  Medication Sig   albuterol  (VENTOLIN  HFA) 108 (90 Base) MCG/ACT inhaler Inhale 2 puffs into the lungs every 6 (six) hours as needed for wheezing or shortness of breath.   aspirin  EC 81 MG tablet Take 81 mg by mouth daily.   azelastine  (ASTELIN ) 0.1 % nasal spray Place 2 sprays into both nostrils 2 (two) times daily. Use in each nostril as directed   cetirizine  (ZYRTEC  ALLERGY) 10 MG tablet Take 10 mg by mouth daily.   fluticasone (FLONASE) 50 MCG/ACT nasal spray Place 2 sprays into both nostrils 2 (two) times daily.   Fluticasone-Umeclidin-Vilant (TRELEGY ELLIPTA ) 100-62.5-25 MCG/ACT AEPB Inhale 1 puff into the lungs daily.   metoprolol  tartrate (LOPRESSOR ) 25 MG tablet TAKE 1/2 TABLET BY MOUTH TWICE A DAY   montelukast (SINGULAIR) 10 MG tablet Take 10 mg by mouth daily.   nitroGLYCERIN  (NITROSTAT ) 0.4 MG SL tablet Place 1 tablet (0.4 mg total) under the tongue every 5 (five) minutes as needed for chest pain.   olmesartan  (BENICAR ) 5 MG tablet TAKE 1/2 TABLET BY MOUTH DAILY **PATIENT NEEDS AN APPOINTMENT**   rosuvastatin  (CRESTOR ) 20 MG tablet Take 1 tablet (20 mg total) by mouth every evening.    sodium chloride  (OCEAN) 0.65 % SOLN nasal spray Place 2 sprays into both nostrils every 4 (four) hours as needed for congestion.   Physical Exam:    VS:  BP 128/74   Pulse 65   Ht 5' 10 (1.778 m)   Wt 174 lb 3.2 oz (79 kg)   SpO2 95%   BMI 25.00 kg/m    Wt Readings from Last 3 Encounters:  07/29/23 174 lb 3.2 oz (79 kg)  07/20/23 170 lb (77.1 kg)  06/13/23 172 lb 8 oz (78.2 kg)    GEN: Well nourished, well developed in no acute distress NECK: No JVD; No carotid bruits CARDIAC: RRR, no murmurs, rubs, gallops RESPIRATORY:  Clear to auscultation without rales, wheezing or rhonchi  ABDOMEN: Soft, non-tender, non-distended EXTREMITIES:  No edema; No acute deformity     Asessement and Plan:.    SOB/prior tobacco use: Patient notes history of shortness of breath following bypass surgery up until last year.  Previously seen by pulmonary and started on Trelegy and as needed albuterol , he is no longer using these.  At last office visit patient noted increased dyspnea, echo indicated LVEF of 65 to 70%, no RWMA, diastolic parameters were  normal. Today he reports ongoing intermittent shortness of breath, he has not yet seen his pulmonologist, feel PFTs may be beneficial.  We discussed a stress test today, through shared decision making elected for patient to see pulmonologist prior to further ischemic evaluation. Patient PFTs in 2019 indicated obstructive disease with significant bronchodilator response.  CAD: S/p CABG x 3 in 2019. Echocardiogram on 07/05/2023 indicated LVEF of 65 to 70%, no RWMA, diastolic parameters were normal, RV systolic size and function was normal, trivial mitral valve regurgitation.  Today patient denies any chest pain, continues to have shortness of breath as noted above.  Discussed ischemic evaluation, through shared decision making elected for patient to see pulmonologist before any further cardiac evaluation.  Reviewed ED precautions. Continue aspirin  81 mg daily, Toprol   tartrate 12.5 mg twice daily, olmesartan  2.5 mg daily and Crestor  20 mg daily.  Dyslipidemia: Last lipid profile on 09/01/2022 indicated total cholesterol 130, HDL 65, triglycerides 125 and LDL 44.  Continue Crestor  20 mg daily.  Patient to have annual physical with fasting lipid profile next month with his PCP.  Hypertension: Blood pressure today 128/74. Continue metoprolol  tartrate 12.5 mg twice daily and olmesartan  2.5 mg daily.    Disposition: F/u with Dr. Lavona or Kayvion Arneson, NP in three months or sooner if needed.   Signed, Tiffany Talarico D Patrick Scadden, NP

## 2023-07-29 ENCOUNTER — Ambulatory Visit: Attending: Cardiology | Admitting: Cardiology

## 2023-07-29 ENCOUNTER — Encounter: Payer: Self-pay | Admitting: Cardiology

## 2023-07-29 VITALS — BP 128/74 | HR 65 | Ht 70.0 in | Wt 174.2 lb

## 2023-07-29 DIAGNOSIS — I2510 Atherosclerotic heart disease of native coronary artery without angina pectoris: Secondary | ICD-10-CM | POA: Diagnosis not present

## 2023-07-29 DIAGNOSIS — E785 Hyperlipidemia, unspecified: Secondary | ICD-10-CM | POA: Diagnosis present

## 2023-07-29 DIAGNOSIS — R0602 Shortness of breath: Secondary | ICD-10-CM | POA: Diagnosis not present

## 2023-07-29 DIAGNOSIS — I10 Essential (primary) hypertension: Secondary | ICD-10-CM | POA: Insufficient documentation

## 2023-07-29 NOTE — Patient Instructions (Signed)
 Medication Instructions:  No changes *If you need a refill on your cardiac medications before your next appointment, please call your pharmacy*  Lab Work: No labs  Testing/Procedures: No testing  Follow-Up: At [facility], you and your health needs are our priority.  As part of our continuing mission to provide you with exceptional heart care, our providers are all part of one team.  This team includes your primary Cardiologist (physician) and Advanced Practice Providers or APPs (Physician Assistants and Nurse Practitioners) who all work together to provide you with the care you need, when you need it.  Your next appointment:   3 month(s)  Provider:   Eilleen Grates, MD or Katlyn West, NP  We recommend signing up for the patient portal called "MyChart".  Sign up information is provided on this After Visit Summary.  MyChart is used to connect with patients for Virtual Visits (Telemedicine).  Patients are able to view lab/test results, encounter notes, upcoming appointments, etc.  Non-urgent messages can be sent to your provider as well.   To learn more about what you can do with MyChart, go to [email or web address].   Other Instructions: Dr. Trevor Fudge offices phone number [phone]   1st Floor: - Lobby - Registration  - Pharmacy  - Lab - Cafe  2nd Floor: - PV Lab - Diagnostic Testing (echo, CT, nuclear med)  3rd Floor: - Vacant  4th Floor: - TCTS (cardiothoracic surgery) - AFib Clinic - Structural Heart Clinic - Vascular Surgery  - Vascular Ultrasound  5th Floor: - [facility] (general and EP) - Clinical Pharmacy for coumadin, hypertension, lipid, weight-loss medications, and med management appointments    Valet parking services will be available as well.

## 2023-09-07 DIAGNOSIS — M25511 Pain in right shoulder: Secondary | ICD-10-CM | POA: Diagnosis not present

## 2023-09-07 DIAGNOSIS — Z0000 Encounter for general adult medical examination without abnormal findings: Secondary | ICD-10-CM | POA: Diagnosis not present

## 2023-09-07 DIAGNOSIS — Z79899 Other long term (current) drug therapy: Secondary | ICD-10-CM | POA: Diagnosis not present

## 2023-09-07 DIAGNOSIS — E559 Vitamin D deficiency, unspecified: Secondary | ICD-10-CM | POA: Diagnosis not present

## 2023-09-07 DIAGNOSIS — Z23 Encounter for immunization: Secondary | ICD-10-CM | POA: Diagnosis not present

## 2023-09-07 DIAGNOSIS — I25810 Atherosclerosis of coronary artery bypass graft(s) without angina pectoris: Secondary | ICD-10-CM | POA: Diagnosis not present

## 2023-09-07 DIAGNOSIS — J449 Chronic obstructive pulmonary disease, unspecified: Secondary | ICD-10-CM | POA: Diagnosis not present

## 2023-09-07 DIAGNOSIS — T7840XA Allergy, unspecified, initial encounter: Secondary | ICD-10-CM | POA: Diagnosis not present

## 2023-09-07 DIAGNOSIS — Z1331 Encounter for screening for depression: Secondary | ICD-10-CM | POA: Diagnosis not present

## 2023-09-07 DIAGNOSIS — G8929 Other chronic pain: Secondary | ICD-10-CM | POA: Diagnosis not present

## 2023-09-07 DIAGNOSIS — K52832 Lymphocytic colitis: Secondary | ICD-10-CM | POA: Diagnosis not present

## 2023-09-07 DIAGNOSIS — E785 Hyperlipidemia, unspecified: Secondary | ICD-10-CM | POA: Diagnosis not present

## 2023-09-07 DIAGNOSIS — E538 Deficiency of other specified B group vitamins: Secondary | ICD-10-CM | POA: Diagnosis not present

## 2023-09-07 DIAGNOSIS — I10 Essential (primary) hypertension: Secondary | ICD-10-CM | POA: Diagnosis not present

## 2023-09-12 DIAGNOSIS — Z23 Encounter for immunization: Secondary | ICD-10-CM | POA: Diagnosis not present

## 2023-09-22 ENCOUNTER — Ambulatory Visit: Admitting: Pulmonary Disease

## 2023-09-22 VITALS — BP 136/71 | HR 59 | Ht 70.0 in | Wt 174.6 lb

## 2023-09-22 DIAGNOSIS — J309 Allergic rhinitis, unspecified: Secondary | ICD-10-CM | POA: Diagnosis not present

## 2023-09-22 DIAGNOSIS — R0602 Shortness of breath: Secondary | ICD-10-CM

## 2023-09-22 NOTE — Progress Notes (Signed)
 Patrick Nguyen    [id number]    [birth year]  Primary Care Physician:Henderson, Winslow Hawk, MD  Referring Physician: Benedetta Bradley, MD [address] [place],  [place] [address]  Chief complaint:    Patient being seen for shortness of breath  HPI:  Shortness of breath with activity  Recently followed up with cardiology  Had been on Trelegy previously but did not stay on it because it was too expensive  Had found some mold in his washing machine at some point was able to remedy the problem  His breathing has been doing relatively well  He tries to remain active, plays golf, does some walking  Sensitivities to grass, cats  Quit smoking in the 1990, remembers having some shortness of breath at the time as well  History of coronary artery disease, status post CABG in 2019  He had had a PFT at that time showing obstructive disease with significant bronchodilator response  He does use Flonase regularly for nasal stuffiness and congestion  Does have a history of hypercholesterolemia, history of hypertension, history of heart failure  Music professor, retired  Outpatient Encounter Medications as of 09/22/2023  Medication Sig   albuterol  (VENTOLIN  HFA) 108 (90 Base) MCG/ACT inhaler Inhale 2 puffs into the lungs every 6 (six) hours as needed for wheezing or shortness of breath.   aspirin  EC 81 MG tablet Take 81 mg by mouth daily.   azelastine  (ASTELIN ) 0.1 % nasal spray Place 2 sprays into both nostrils 2 (two) times daily. Use in each nostril as directed   cetirizine  (ZYRTEC  ALLERGY) 10 MG tablet Take 10 mg by mouth daily.   fluticasone (FLONASE) 50 MCG/ACT nasal spray Place 2 sprays into both nostrils 2 (two) times daily.   ipratropium (ATROVENT ) 0.06 % nasal spray Place 2 sprays into both nostrils 2 (two) times daily as needed for rhinitis.   metoprolol  tartrate (LOPRESSOR ) 25 MG tablet TAKE 1/2 TABLET BY MOUTH TWICE A DAY   montelukast  (SINGULAIR) 10 MG tablet Take 10 mg by mouth daily.   nitroGLYCERIN  (NITROSTAT ) 0.4 MG SL tablet Place 1 tablet (0.4 mg total) under the tongue every 5 (five) minutes as needed for chest pain.   olmesartan  (BENICAR ) 5 MG tablet TAKE 1/2 TABLET BY MOUTH DAILY **PATIENT NEEDS AN APPOINTMENT**   rosuvastatin  (CRESTOR ) 20 MG tablet Take 1 tablet (20 mg total) by mouth every evening.   sodium chloride  (OCEAN) 0.65 % SOLN nasal spray Place 2 sprays into both nostrils every 4 (four) hours as needed for congestion.   Fluticasone-Umeclidin-Vilant (TRELEGY ELLIPTA ) 100-62.5-25 MCG/ACT AEPB Inhale 1 puff into the lungs daily. (Patient not taking: Reported on 09/22/2023)   No facility-administered encounter medications on file as of 09/22/2023.    Allergies as of 09/22/2023 - Review Complete 07/29/2023  Allergen Reaction Noted   Atorvastatin Other (See Comments) 05/13/2017    Past Medical History:  Diagnosis Date   Arthritis    CAD (coronary artery disease) 05/13/2017   s/p CABG w/ / LIMA-LAD, SVG-OM, SVG-PDA   Dyspnea    w/ exertion    Elevated coronary artery calcium  score    GERD (gastroesophageal reflux disease)    hx   Hepatitis    in   high  school     Hyperlipidemia    Hypertension    Rosacea     Past Surgical History:  Procedure Laterality Date   BACK SURGERY N/A    CARDIAC CATHETERIZATION  CORONARY ARTERY BYPASS GRAFT N/A 06/07/2017   Procedure: CORONARY ARTERY BYPASS GRAFTING (CABG) x three, using left internal mammary artery and right leg greater saphenous vein harvested endoscopically;  Surgeon: Heriberto London, MD;  Location: [facility] OR;  Service: Open Heart Surgery;  Laterality: N/A;   CORONARY PRESSURE/FFR STUDY N/A 05/19/2017   Procedure: INTRAVASCULAR PRESSURE WIRE/FFR STUDY;  Surgeon: Arty Binning, MD;  Location: [facility] INVASIVE CV LAB;  Service: Cardiovascular;  Laterality: N/A;   LEFT HEART CATH AND CORONARY ANGIOGRAPHY N/A 05/19/2017   Procedure: LEFT HEART CATH AND  CORONARY ANGIOGRAPHY;  Surgeon: Arty Binning, MD;  Location: [facility] INVASIVE CV LAB;  Service: Cardiovascular;  Laterality: N/A;   ROTATOR CUFF REPAIR Left    TEE WITHOUT CARDIOVERSION N/A 06/07/2017   Procedure: TRANSESOPHAGEAL ECHOCARDIOGRAM (TEE);  Surgeon: Matt Song, Donata Fryer, MD;  Location: [facility] OR;  Service: Open Heart Surgery;  Laterality: N/A;    Family History  Problem Relation Age of Onset   Heart disease Mother 52   Hypertension Mother    Cancer Father        Larynx   AAA (abdominal aortic aneurysm) Father     Social History   Socioeconomic History   Marital status: Married    Spouse name: Not on file   Number of children: Not on file   Years of education: Not on file   Highest education level: Not on file  Occupational History   Not on file  Tobacco Use   Smoking status: Former    Current packs/day: 0.00    Types: Cigarettes    Quit date: 05/13/1986    Years since quitting: 37.3   Smokeless tobacco: Never  Vaping Use   Vaping status: Never Used  Substance and Sexual Activity   Alcohol use: Yes    Alcohol/week: 3.0 standard drinks of alcohol    Types: 2 Cans of beer, 1 Glasses of wine per week    Comment: daily   Drug use: No   Sexual activity: Not on file  Other Topics Concern   Not on file  Social History Narrative   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     Social Drivers of Corporate investment banker Strain: Not on file  Food Insecurity: Not on file  Transportation Needs: Not on file  Physical Activity: Not on file  Stress: Not on file  Social Connections: Not on file  Intimate Partner Violence: Not on file    Review of Systems  Respiratory:  Positive for shortness of breath. Negative for cough.     Vitals:   09/22/23 1406  BP: 136/71  Pulse: (!) 59  SpO2: 91%     Physical Exam Constitutional:      Appearance: Normal appearance.  HENT:     Head: Normocephalic.     Nose: Nose normal.   Eyes:     General: No scleral icterus.    Pupils:  Pupils are equal, round, and reactive to light.    Cardiovascular:     Rate and Rhythm: Normal rate and regular rhythm.     Heart sounds: No murmur heard.    No friction rub.  Pulmonary:     Effort: No respiratory distress.     Breath sounds: No stridor. No wheezing or rhonchi.   Musculoskeletal:     Cervical back: No rigidity or tenderness.   Neurological:     Mental Status: He is alert.   Psychiatric:        Mood  and Affect: Mood normal.    Data Reviewed: PFT reviewed with the patient, was performed in 2019 PFT did reveal severe obstruction with significant bronchodilator response, moderate reduction in diffusing capacity  Assessment:  Shortness of breath on exertion  He does have an albuterol  at home that he will check to make sure it has not expired He will use albuterol  2 puffs up to 4 times a day as needed  He does have a history of coronary artery disease status post CABG in 2019  Allergies with nasal stuffiness and congestion on a regular basis -Did recently follow-up with ENT - On nasal sprays  Plan/Recommendations:  Schedule for pulmonary function test  Use albuterol  as needed  Depending on pulmonary function test, may need other maintenance inhalers  Follow-up in about 6 weeks    Myer Artis MD [facility] 09/22/2023, 2:08 PM  CC: Benedetta Bradley, *

## 2023-09-22 NOTE — Patient Instructions (Addendum)
 Graded activities as tolerated  The use of a step counter and holding yourself accountable to a number of steps on a daily basis is a way to get exercising on a regular basis  Use albuterol  as needed you can use albuterol  up to 4 times a day as needed for shortness of breath  We will schedule you for breathing study-this will guide us  with whether we need to escalate to a different inhaler

## 2023-09-23 ENCOUNTER — Other Ambulatory Visit (HOSPITAL_BASED_OUTPATIENT_CLINIC_OR_DEPARTMENT_OTHER): Payer: Self-pay

## 2023-09-23 MED ORDER — ALBUTEROL SULFATE HFA 108 (90 BASE) MCG/ACT IN AERS
2.0000 | INHALATION_SPRAY | Freq: Four times a day (QID) | RESPIRATORY_TRACT | 3 refills | Status: AC | PRN
Start: 2023-09-23 — End: ?

## 2023-10-27 NOTE — Progress Notes (Unsigned)
  Cardiology Office Note:   Date:  10/28/2023  ID:  Patrick Nguyen, DOB [birth year], MRN [id number] PCP: Charlott Dorn LABOR, MD  [facility] Providers Cardiologist:  Lynwood Schilling, MD {  History of Present Illness:   Patrick Nguyen is a 83 y.o. male who presents for follow up of CAD with LIMA to LAD,SVG to OM, SVG to PDA in 2019.  He has had chronic dyspnea probably related to COVID in the past.  Since he was last seen he has been doing well.  He does see pulmonary and was close get pulmonary function testing.  He does have dyspnea on exertion but this is not different with the bronchodilators he is having.  Is not any worse than previous.  He is not having any new chest pressure, neck or arm discomfort.  Is not having any new shortness of breath, PND or orthopnea.  He has had no weight gain or edema.  He [place] golf [place].  He goes up and down stairs.  He is not particularly active.  He says his shortness of breath is worse in the morning before he blows his nose.  He thinks some of it is upper congestion.  Both  ROS: As stated in the HPI and negative for all other systems.  Studies Reviewed:    EKG:     06/13/2023 sinus bradycardia, right bundle branch block, no acute ST-T wave changes, no change from previous.    Risk Assessment/Calculations:             Physical Exam:   VS:  BP (!) 110/50   Pulse 60   Ht 5' 10 (1.778 m)   Wt 172 lb (78 kg)   SpO2 94%   BMI 24.68 kg/m    Wt Readings from Last 3 Encounters:  10/28/23 172 lb (78 kg)  09/22/23 174 lb 9.6 oz (79.2 kg)  07/29/23 174 lb 3.2 oz (79 kg)     GEN: Well nourished, well developed in no acute distress NECK: No JVD; No carotid bruits CARDIAC: RRR, no murmurs, rubs, gallops RESPIRATORY:  Clear to auscultation without rales, wheezing or rhonchi  ABDOMEN: Soft, non-tender, non-distended EXTREMITIES:  No edema; No deformity   ASSESSMENT AND PLAN:   SOB/prior tobacco use: The patient has no new symptoms.   His breathing is at baseline.  I did send a message to his pulmonologist to reschedule PFTs and these are planned when he is seen in the office next time.  No change in therapy.   CAD:   The patient has no new sypmtoms.  No further cardiovascular testing is indicated.  We will continue with aggressive risk reduction and meds as listed.  Dyslipidemia: LDL is at target.  He has an excellent HDL.  No change in therapy.  Hypertension: Blood pressure is but blood pressure is at target.  No change in therapy.  Follow up with me in one year.   Signed, Lynwood Schilling, MD

## 2023-10-28 ENCOUNTER — Encounter: Payer: Self-pay | Admitting: Cardiology

## 2023-10-28 ENCOUNTER — Ambulatory Visit: Attending: Cardiology | Admitting: Cardiology

## 2023-10-28 VITALS — BP 110/50 | HR 60 | Ht 70.0 in | Wt 172.0 lb

## 2023-10-28 DIAGNOSIS — I2510 Atherosclerotic heart disease of native coronary artery without angina pectoris: Secondary | ICD-10-CM | POA: Insufficient documentation

## 2023-10-28 DIAGNOSIS — E785 Hyperlipidemia, unspecified: Secondary | ICD-10-CM | POA: Insufficient documentation

## 2023-10-28 DIAGNOSIS — I10 Essential (primary) hypertension: Secondary | ICD-10-CM | POA: Insufficient documentation

## 2023-10-28 DIAGNOSIS — R0602 Shortness of breath: Secondary | ICD-10-CM | POA: Insufficient documentation

## 2023-10-28 NOTE — Patient Instructions (Signed)

## 2023-11-16 DIAGNOSIS — J300 Vasomotor rhinitis: Secondary | ICD-10-CM | POA: Diagnosis not present

## 2023-11-16 DIAGNOSIS — R0602 Shortness of breath: Secondary | ICD-10-CM | POA: Diagnosis not present

## 2023-11-16 DIAGNOSIS — J342 Deviated nasal septum: Secondary | ICD-10-CM | POA: Diagnosis not present

## 2023-11-16 DIAGNOSIS — R42 Dizziness and giddiness: Secondary | ICD-10-CM | POA: Diagnosis not present

## 2023-11-26 ENCOUNTER — Other Ambulatory Visit: Payer: Self-pay | Admitting: Cardiology

## 2024-01-19 DIAGNOSIS — Z23 Encounter for immunization: Secondary | ICD-10-CM | POA: Diagnosis not present

## 2024-02-06 ENCOUNTER — Ambulatory Visit

## 2024-02-06 ENCOUNTER — Ambulatory Visit (INDEPENDENT_AMBULATORY_CARE_PROVIDER_SITE_OTHER): Admitting: Pulmonary Disease

## 2024-02-06 ENCOUNTER — Encounter: Payer: Self-pay | Admitting: Pulmonary Disease

## 2024-02-06 VITALS — BP 108/58 | HR 64 | Ht 70.0 in | Wt 176.0 lb

## 2024-02-06 DIAGNOSIS — R0602 Shortness of breath: Secondary | ICD-10-CM | POA: Diagnosis not present

## 2024-02-06 DIAGNOSIS — R942 Abnormal results of pulmonary function studies: Secondary | ICD-10-CM

## 2024-02-06 LAB — PULMONARY FUNCTION TEST
DL/VA % pred: 62 %
DL/VA: 2.41 ml/min/mmHg/L
DLCO unc % pred: 49 %
DLCO unc: 11.93 ml/min/mmHg
FEF 25-75 Post: 0.59 L/s
FEF 25-75 Pre: 0.38 L/s
FEF2575-%Change-Post: 53 %
FEF2575-%Pred-Post: 30 %
FEF2575-%Pred-Pre: 19 %
FEV1-%Change-Post: 25 %
FEV1-%Pred-Post: 43 %
FEV1-%Pred-Pre: 34 %
FEV1-Post: 1.22 L
FEV1-Pre: 0.97 L
FEV1FVC-%Change-Post: 7 %
FEV1FVC-%Pred-Pre: 54 %
FEV6-%Change-Post: 17 %
FEV6-%Pred-Post: 72 %
FEV6-%Pred-Pre: 61 %
FEV6-Post: 2.7 L
FEV6-Pre: 2.29 L
FEV6FVC-%Change-Post: 1 %
FEV6FVC-%Pred-Post: 100 %
FEV6FVC-%Pred-Pre: 99 %
FVC-%Change-Post: 16 %
FVC-%Pred-Post: 72 %
FVC-%Pred-Pre: 62 %
FVC-Post: 2.88 L
FVC-Pre: 2.48 L
Post FEV1/FVC ratio: 42 %
Post FEV6/FVC ratio: 94 %
Pre FEV1/FVC ratio: 39 %
Pre FEV6/FVC Ratio: 92 %
RV % pred: 165 %
RV: 4.43 L
TLC % pred: 111 %
TLC: 7.86 L

## 2024-02-06 NOTE — Progress Notes (Signed)
 Elridge Stemm    [id number]    May 04, 1940  Primary Care Physician:Henderson, Dorn LABOR, MD  Referring Physician: Charlott Dorn LABOR, MD [address] [place],  [place] [address]  Chief complaint:    Patient being seen for shortness of breath  HPI:  Breathing is stable compared to previous  Was not able to stay on Trelegy, was too expensive  His breathing has been pretty good Uses albuterol  sparingly less than once a week  He tries to stay active, plays golf, does some walking  He has sensitivity to grass, cats Quit smoking in the 1990s History of coronary artery disease, status post CABG in 2019  He had had a PFT at that time showing obstructive disease with significant bronchodilator response  He does use Flonase regularly for nasal stuffiness and congestion  Does have a history of hypercholesterolemia, history of hypertension, history of heart failure  Music professor, retired  Outpatient Encounter Medications as of 02/06/2024  Medication Sig   albuterol  (VENTOLIN  HFA) 108 (90 Base) MCG/ACT inhaler Inhale 2 puffs into the lungs every 6 (six) hours as needed for wheezing or shortness of breath.   aspirin  EC 81 MG tablet Take 81 mg by mouth daily.   azelastine  (ASTELIN ) 0.1 % nasal spray Place 2 sprays into both nostrils 2 (two) times daily. Use in each nostril as directed   cetirizine  (ZYRTEC  ALLERGY) 10 MG tablet Take 10 mg by mouth daily.   fluticasone (FLONASE) 50 MCG/ACT nasal spray Place 2 sprays into both nostrils 2 (two) times daily.   metoprolol  tartrate (LOPRESSOR ) 25 MG tablet TAKE 1/2 TABLET BY MOUTH TWICE A DAY   montelukast (SINGULAIR) 10 MG tablet Take 10 mg by mouth daily.   nitroGLYCERIN  (NITROSTAT ) 0.4 MG SL tablet Place 1 tablet (0.4 mg total) under the tongue every 5 (five) minutes as needed for chest pain.   olmesartan  (BENICAR ) 5 MG tablet Take 0.5 tablets (2.5 mg total) by mouth daily.   rosuvastatin  (CRESTOR ) 20 MG  tablet Take 1 tablet (20 mg total) by mouth every evening.   Fluticasone-Umeclidin-Vilant (TRELEGY ELLIPTA ) 100-62.5-25 MCG/ACT AEPB Inhale 1 puff into the lungs daily.   ipratropium (ATROVENT ) 0.06 % nasal spray Place 2 sprays into both nostrils 2 (two) times daily as needed for rhinitis.   sodium chloride  (OCEAN) 0.65 % SOLN nasal spray Place 2 sprays into both nostrils every 4 (four) hours as needed for congestion.   No facility-administered encounter medications on file as of 02/06/2024.    Allergies as of 02/06/2024 - Review Complete 02/06/2024  Allergen Reaction Noted   Atorvastatin Other (See Comments) 05/13/2017    Past Medical History:  Diagnosis Date   Arthritis    CAD (coronary artery disease) 05/13/2017   s/p CABG w/ / LIMA-LAD, SVG-OM, SVG-PDA   Dyspnea    w/ exertion    Elevated coronary artery calcium  score    GERD (gastroesophageal reflux disease)    hx   Hepatitis    in   high  school     Hyperlipidemia    Hypertension    Rosacea     Past Surgical History:  Procedure Laterality Date   BACK SURGERY N/A    CARDIAC CATHETERIZATION     CORONARY ARTERY BYPASS GRAFT N/A 06/07/2017   Procedure: CORONARY ARTERY BYPASS GRAFTING (CABG) x three, using left internal mammary artery and right leg greater saphenous vein harvested endoscopically;  Surgeon: Fleeta Hanford Coy, MD;  Location:  [facility] OR;  Service: Open Heart Surgery;  Laterality: N/A;   CORONARY PRESSURE/FFR STUDY N/A 05/19/2017   Procedure: INTRAVASCULAR PRESSURE WIRE/FFR STUDY;  Surgeon: Claudene Victory ORN, MD;  Location: [facility];  Service: Cardiovascular;  Laterality: N/A;   LEFT HEART CATH AND CORONARY ANGIOGRAPHY N/A 05/19/2017   Procedure: LEFT HEART CATH AND CORONARY ANGIOGRAPHY;  Surgeon: Claudene Victory ORN, MD;  Location: [facility];  Service: Cardiovascular;  Laterality: N/A;   ROTATOR CUFF REPAIR Left    TEE WITHOUT CARDIOVERSION N/A 06/07/2017   Procedure: TRANSESOPHAGEAL ECHOCARDIOGRAM (TEE);   Surgeon: Fleeta Ochoa, Maude, MD;  Location: [facility] OR;  Service: Open Heart Surgery;  Laterality: N/A;    Family History  Problem Relation Age of Onset   Heart disease Mother 34   Hypertension Mother    Cancer Father        Larynx   AAA (abdominal aortic aneurysm) Father     Social History   Socioeconomic History   Marital status: Married    Spouse name: Not on file   Number of children: Not on file   Years of education: Not on file   Highest education level: Not on file  Occupational History   Not on file  Tobacco Use   Smoking status: Former    Current packs/day: 0.00    Types: Cigarettes    Quit date: 05/13/1986    Years since quitting: 37.7   Smokeless tobacco: Never  Vaping Use   Vaping status: Never Used  Substance and Sexual Activity   Alcohol use: Yes    Alcohol/week: 3.0 standard drinks of alcohol    Types: 2 Cans of beer, 1 Glasses of wine per week    Comment: daily   Drug use: No   Sexual activity: Not on file  Other Topics Concern   Not on file  Social History Narrative   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     Social Drivers of Corporate Investment Banker Strain: Not on file  Food Insecurity: Not on file  Transportation Needs: Not on file  Physical Activity: Not on file  Stress: Not on file  Social Connections: Not on file  Intimate Partner Violence: Not on file    Review of Systems  Respiratory:  Positive for shortness of breath. Negative for cough and chest tightness.     Vitals:   02/06/24 1436  BP: (!) 108/58  Pulse: 64  SpO2: 94%     Physical Exam Constitutional:      Appearance: Normal appearance.  HENT:     Head: Normocephalic.     Nose: Nose normal.  Eyes:     General: No scleral icterus.    Pupils: Pupils are equal, round, and reactive to light.  Cardiovascular:     Rate and Rhythm: Normal rate and regular rhythm.     Heart sounds: No murmur heard.    No friction rub.  Pulmonary:     Effort: No respiratory distress.      Breath sounds: No stridor. No wheezing or rhonchi.     Comments: Decreased breath sounds bilaterally Musculoskeletal:     Cervical back: No rigidity or tenderness.  Neurological:     Mental Status: He is alert.  Psychiatric:        Mood and Affect: Mood normal.    Data Reviewed: PFT reviewed with the patient, was performed in 2019 Repeat PFT was reviewed with the patient today showing severe obstructive disease with FEV1  of 43% with significant bronchodilator response, hyperinflated lungs  Assessment:  Gold 3 COPD  Emphysema  Hyperinflation-abnormal PFT  He continues to use albuterol  just as needed  Tries to stay active  Coronary artery disease Status post CABG in 2019 - Has been relatively stable  History of allergies, nasal stuffiness and congestion - On nasal sprays  Plan/Recommendations:  Continue albuterol  as needed  Graded at exercise as tolerated  Follow-up in about 6 months  Trelegy was not affordable for him  -Able to function well, we will focus on graded exercises    Jennet Epley MD [facility] 02/06/2024, 2:41 PM  CC: Charlott Dorn LABOR, *

## 2024-02-06 NOTE — Patient Instructions (Signed)
 I will see you about 6 months from here  Your breathing study did show some progression from previously  Continue using your albuterol  as needed  Graded exercise as tolerated  Call us  with significant concerns

## 2024-02-06 NOTE — Patient Instructions (Signed)
 Full pft performed today

## 2024-02-06 NOTE — Progress Notes (Signed)
 Full pft performed today

## 2024-02-17 ENCOUNTER — Ambulatory Visit: Payer: Self-pay | Admitting: Pulmonary Disease

## 2024-03-07 DIAGNOSIS — M722 Plantar fascial fibromatosis: Secondary | ICD-10-CM | POA: Diagnosis not present

## 2024-03-07 DIAGNOSIS — J439 Emphysema, unspecified: Secondary | ICD-10-CM | POA: Diagnosis not present

## 2024-05-04 ENCOUNTER — Ambulatory Visit
Admission: EM | Admit: 2024-05-04 | Discharge: 2024-05-04 | Disposition: A | Discharge status: Home / Self Care | Attending: Family Medicine | Admitting: Family Medicine

## 2024-05-04 DIAGNOSIS — H1012 Acute atopic conjunctivitis, left eye: Secondary | ICD-10-CM | POA: Diagnosis not present

## 2024-05-04 MED ORDER — AZELASTINE HCL 0.05 % OP SOLN: 1.0000 [drp] | Freq: Two times a day (BID) | OPHTHALMIC | 0 refills | Status: AC

## 2024-05-04 NOTE — Discharge Instructions (Addendum)
 Start antihistamine eyedrops twice daily to the left eye.  Immediate warm compresses as needed.  Follow-up with your ophthalmologist if your symptoms do not improve.  Please go to the ER for any worsening symptoms.  Hope you feel better soon!

## 2024-05-04 NOTE — ED Triage Notes (Signed)
 Pt states left eye pain and redness since this morning.  States he used eye wash at home with no relief.

## 2024-05-04 NOTE — ED Provider Notes (Signed)
 " [facility]    CSN: [id number] Arrival date & time: 05/04/24  1832      History   Chief Complaint Chief Complaint  Patient presents with   Eye Pain    HPI Patrick Nguyen is a 84 y.o. male presents for eye irritation.  Patient reports he developed left eye redness, irritation/pain, foreign body sensation and mild tearing.  He is not sure if anything got into his eye.  He does endorse some mild allergy symptoms as well.  No fevers, visual changes, photophobia or eye injury.  Does have a history of cataract surgery.  No glasses or contacts.  Tried irrigating his eye at home without improvement.  No other concerns   Eye Pain    Past Medical History:  Diagnosis Date   Arthritis    CAD (coronary artery disease) 05/13/2017   s/p CABG w/ / LIMA-LAD, SVG-OM, SVG-PDA   Dyspnea    w/ exertion    Elevated coronary artery calcium  score    GERD (gastroesophageal reflux disease)    hx   Hepatitis    in   high  school     Hyperlipidemia    Hypertension    Rosacea     Patient Active Problem List   Diagnosis Date Noted   Dizziness 05/06/2021   Chest pain of uncertain etiology 09/05/2018   Coronary artery disease involving native coronary artery of native heart without angina pectoris 09/05/2018   Educated about COVID-19 virus infection 09/05/2018   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   DOE (dyspnea on exertion) 05/13/2017   Abnormal cardiovascular stress test 05/13/2017   High coronary artery calcium  score 05/13/2017   Essential hypertension 05/13/2017   Dyslipidemia 05/13/2017    Past Surgical History:  Procedure Laterality Date   BACK SURGERY N/A    CARDIAC CATHETERIZATION     CORONARY ARTERY BYPASS GRAFT N/A 06/07/2017   Procedure: CORONARY ARTERY BYPASS GRAFTING (CABG) x three, using left internal mammary artery and right leg greater saphenous vein harvested endoscopically;  Surgeon: Fleeta Hanford Coy, MD;  Location: [facility] OR;  Service: Open Heart Surgery;  Laterality: N/A;    CORONARY PRESSURE/FFR STUDY N/A 05/19/2017   Procedure: INTRAVASCULAR PRESSURE WIRE/FFR STUDY;  Surgeon: Claudene Victory ORN, MD;  Location: [facility];  Service: Cardiovascular;  Laterality: N/A;   LEFT HEART CATH AND CORONARY ANGIOGRAPHY N/A 05/19/2017   Procedure: LEFT HEART CATH AND CORONARY ANGIOGRAPHY;  Surgeon: Claudene Victory ORN, MD;  Location: [facility];  Service: Cardiovascular;  Laterality: N/A;   ROTATOR CUFF REPAIR Left    TEE WITHOUT CARDIOVERSION N/A 06/07/2017   Procedure: TRANSESOPHAGEAL ECHOCARDIOGRAM (TEE);  Surgeon: Fleeta Hanford, Coy, MD;  Location: [facility] OR;  Service: Open Heart Surgery;  Laterality: N/A;       Home Medications    Prior to Admission medications  Medication Sig Start Date End Date Taking? Authorizing Provider  azelastine  (OPTIVAR ) 0.05 % ophthalmic solution Place 1 drop into the left eye 2 (two) times daily. 05/04/24  Yes Roxi Hlavaty, Jodi R, NP  albuterol  (VENTOLIN  HFA) 108 (90 Base) MCG/ACT inhaler Inhale 2 puffs into the lungs every 6 (six) hours as needed for wheezing or shortness of breath. 09/23/23   Neda Jennet LABOR, MD  aspirin  EC 81 MG tablet Take 81 mg by mouth daily.    [provider]  azelastine  (ASTELIN ) 0.1 % nasal spray Place 2 sprays into both nostrils 2 (two) times daily. Use in each nostril as directed 07/20/23  Tobie Eldora NOVAK, MD  cetirizine  (ZYRTEC  ALLERGY) 10 MG tablet Take 10 mg by mouth daily.    [provider]  fluticasone (FLONASE) 50 MCG/ACT nasal spray Place 2 sprays into both nostrils 2 (two) times daily. 04/15/21   [provider]  metoprolol  tartrate (LOPRESSOR ) 25 MG tablet TAKE 1/2 TABLET BY MOUTH TWICE A DAY 05/12/23   Lavona Agent, MD  montelukast (SINGULAIR) 10 MG tablet Take 10 mg by mouth daily.    [provider]  nitroGLYCERIN  (NITROSTAT ) 0.4 MG SL tablet Place 1 tablet (0.4 mg total) under the tongue every 5 (five) minutes as needed for chest pain. 06/13/23 02/06/24  West, Katlyn D,  NP  olmesartan  (BENICAR ) 5 MG tablet Take 0.5 tablets (2.5 mg total) by mouth daily. 11/28/23   Lavona Agent, MD  rosuvastatin  (CRESTOR ) 20 MG tablet Take 1 tablet (20 mg total) by mouth every evening. 07/20/17   Obadiah Coy, MD  sodium chloride  (OCEAN) 0.65 % SOLN nasal spray Place 2 sprays into both nostrils every 4 (four) hours as needed for congestion. 04/21/23   Tobie Eldora NOVAK, MD    Family History Family History  Problem Relation Age of Onset   Heart disease Mother 19   Hypertension Mother    Cancer Father        Larynx   AAA (abdominal aortic aneurysm) Father     Social History Social History[1]   Allergies   Atorvastatin   Review of Systems Review of Systems  Eyes:  Positive for pain and redness.     Physical Exam Triage Vital Signs ED Triage Vitals  Encounter Vitals Group     BP 05/04/24 1911 (!) 149/74     Girls Systolic BP Percentile --      Girls Diastolic BP Percentile --      Boys Systolic BP Percentile --      Boys Diastolic BP Percentile --      Pulse Rate 05/04/24 1911 60     Resp 05/04/24 1911 16     Temp 05/04/24 1911 98 F (36.7 C)     Temp Source 05/04/24 1911 Oral     SpO2 05/04/24 1911 93 %     Weight --      Height --      Head Circumference --      Peak Flow --      Pain Score 05/04/24 1910 8     Pain Loc --      Pain Education --      Exclude from Growth Chart --    No data found.  Updated Vital Signs BP (!) 149/74 (BP Location: Right Arm)   Pulse 60   Temp 98 F (36.7 C) (Oral)   Resp 16   SpO2 93%   Visual Acuity Right Eye Distance: 20/20 Left Eye Distance: 20/20 Bilateral Distance: 20/20  Right Eye Near:   Left Eye Near:    Bilateral Near:     Physical Exam Vitals and nursing note reviewed.  Constitutional:      General: He is not in acute distress.    Appearance: Normal appearance. He is not ill-appearing.  HENT:     Head: Normocephalic and atraumatic.  Eyes:     General: Lids are normal.        Right  eye: No foreign body or hordeolum.     Conjunctiva/sclera:     Right eye: Right conjunctiva is injected. No chemosis, exudate or hemorrhage.    Pupils: Pupils  are equal, round, and reactive to light.     Right eye: No corneal abrasion or fluorescein uptake.     Comments: Mild watery discharge  Cardiovascular:     Rate and Rhythm: Normal rate.  Pulmonary:     Effort: Pulmonary effort is normal.  Skin:    General: Skin is warm and dry.  Neurological:     General: No focal deficit present.     Mental Status: He is alert and oriented to person, place, and time.  Psychiatric:        Mood and Affect: Mood normal.        Behavior: Behavior normal.      UC Treatments / Results  Labs (all labs ordered are listed, but only abnormal results are displayed) Labs Reviewed - No data to display  EKG   Radiology No results found.  Procedures Procedures (including critical care time)  Medications Ordered in UC Medications - No data to display  Initial Impression / Assessment and Plan / UC Course  I have reviewed the triage vital signs and the nursing notes.  Pertinent labs & imaging results that were available during my care of the patient were reviewed by me and considered in my medical decision making (see chart for details).     Reviewed exam and symptoms with patient.  Negative fluorescein uptake.  No visible foreign body on exam.  Discussed likely allergic conjunctivitis, will do trial of antihistamine eyedrops advise warm compresses as needed.  Ophthalmology follow-up if symptoms do not improve.  ER precautions reviewed Final Clinical Impressions(s) / UC Diagnoses   Final diagnoses:  Allergic conjunctivitis of left eye     Discharge Instructions      Start antihistamine eyedrops twice daily to the left eye.  Immediate warm compresses as needed.  Follow-up with your ophthalmologist if your symptoms do not improve.  Please go to the ER for any worsening symptoms.  Hope you  feel better soon!    ED Prescriptions     Medication Sig Dispense Auth. Provider   azelastine  (OPTIVAR ) 0.05 % ophthalmic solution Place 1 drop into the left eye 2 (two) times daily. 6 mL Dyan Labarbera, Jodi R, NP      PDMP not reviewed this encounter.    [1]  Social History Tobacco Use   Smoking status: Former    Current packs/day: 0.00    Types: Cigarettes    Quit date: 05/13/1986    Years since quitting: 38.0   Smokeless tobacco: Never  Vaping Use   Vaping status: Never Used  Substance Use Topics   Alcohol use: Yes    Alcohol/week: 3.0 standard drinks of alcohol    Types: 2 Cans of beer, 1 Glasses of wine per week    Comment: daily   Drug use: No     Loreda Myla SAUNDERS, NP 05/04/24 1938  "

## 2024-05-13 ENCOUNTER — Other Ambulatory Visit: Payer: Self-pay | Admitting: Cardiology
